# Patient Record
Sex: Female | Born: 1950
Health system: Southern US, Community
[De-identification: ages and names within clinical notes are randomized; demographics above are authoritative.]

## PROBLEM LIST (undated history)

## (undated) DIAGNOSIS — F419 Anxiety disorder, unspecified: Secondary | ICD-10-CM

## (undated) DIAGNOSIS — G959 Disease of spinal cord, unspecified: Secondary | ICD-10-CM

## (undated) DIAGNOSIS — Z973 Presence of spectacles and contact lenses: Secondary | ICD-10-CM

## (undated) DIAGNOSIS — R51 Headache: Secondary | ICD-10-CM

## (undated) DIAGNOSIS — E785 Hyperlipidemia, unspecified: Secondary | ICD-10-CM

## (undated) DIAGNOSIS — H269 Unspecified cataract: Secondary | ICD-10-CM

## (undated) DIAGNOSIS — I10 Essential (primary) hypertension: Secondary | ICD-10-CM

## (undated) DIAGNOSIS — Z8719 Personal history of other diseases of the digestive system: Secondary | ICD-10-CM

## (undated) DIAGNOSIS — J449 Chronic obstructive pulmonary disease, unspecified: Secondary | ICD-10-CM

## (undated) DIAGNOSIS — N200 Calculus of kidney: Secondary | ICD-10-CM

## (undated) DIAGNOSIS — M1990 Unspecified osteoarthritis, unspecified site: Secondary | ICD-10-CM

## (undated) DIAGNOSIS — F32A Depression, unspecified: Secondary | ICD-10-CM

## (undated) DIAGNOSIS — H81399 Other peripheral vertigo, unspecified ear: Secondary | ICD-10-CM

## (undated) DIAGNOSIS — F1910 Other psychoactive substance abuse, uncomplicated: Secondary | ICD-10-CM

## (undated) DIAGNOSIS — R519 Headache, unspecified: Secondary | ICD-10-CM

## (undated) DIAGNOSIS — F329 Major depressive disorder, single episode, unspecified: Secondary | ICD-10-CM

## (undated) DIAGNOSIS — G709 Myoneural disorder, unspecified: Secondary | ICD-10-CM

## (undated) HISTORY — DX: Anxiety disorder, unspecified: F41.9

## (undated) HISTORY — DX: Hyperlipidemia, unspecified: E78.5

## (undated) HISTORY — DX: Other psychoactive substance abuse, uncomplicated: F19.10

## (undated) HISTORY — DX: Myoneural disorder, unspecified: G70.9

## (undated) HISTORY — DX: Unspecified osteoarthritis, unspecified site: M19.90

## (undated) HISTORY — PX: APPENDECTOMY: SHX54

## (undated) HISTORY — PX: ABDOMINAL HYSTERECTOMY: SHX81

## (undated) HISTORY — DX: Depression, unspecified: F32.A

## (undated) HISTORY — DX: Major depressive disorder, single episode, unspecified: F32.9

---

## 2011-09-13 ENCOUNTER — Emergency Department (HOSPITAL_COMMUNITY): Payer: Self-pay

## 2011-09-13 ENCOUNTER — Other Ambulatory Visit: Payer: Self-pay

## 2011-09-13 ENCOUNTER — Encounter (HOSPITAL_COMMUNITY): Payer: Self-pay | Admitting: Emergency Medicine

## 2011-09-13 ENCOUNTER — Emergency Department (HOSPITAL_COMMUNITY)
Admission: EM | Admit: 2011-09-13 | Discharge: 2011-09-13 | Disposition: A | Discharge status: Home / Self Care | Payer: Self-pay | Attending: Emergency Medicine | Admitting: Emergency Medicine

## 2011-09-13 DIAGNOSIS — H81399 Other peripheral vertigo, unspecified ear: Secondary | ICD-10-CM | POA: Insufficient documentation

## 2011-09-13 HISTORY — DX: Essential (primary) hypertension: I10

## 2011-09-13 HISTORY — DX: Chronic obstructive pulmonary disease, unspecified: J44.9

## 2011-09-13 LAB — BASIC METABOLIC PANEL
BUN: 13 mg/dL (ref 6–23)
CO2: 24 mEq/L (ref 19–32)
Calcium: 10.4 mg/dL (ref 8.4–10.5)
Chloride: 100 mEq/L (ref 96–112)
Creatinine, Ser: 0.63 mg/dL (ref 0.50–1.10)
GFR calc Af Amer: >90 mL/min (ref >90)
GFR calc non Af Amer: >90 mL/min (ref >90)
Glucose, Bld: 180 mg/dL — ABNORMAL HIGH (ref 70–99)
Potassium: 4.1 mEq/L (ref 3.5–5.1)
Sodium: 138 mEq/L (ref 135–145)

## 2011-09-13 LAB — CBC
HCT: 42.9 % (ref 36.0–46.0)
Hemoglobin: 14.3 g/dL (ref 12.0–15.0)
MCH: 29.4 pg (ref 26.0–34.0)
MCHC: 33.3 g/dL (ref 30.0–36.0)
MCV: 88.3 fL (ref 78.0–100.0)
Platelets: 348 10*3/uL (ref 150–400)
RBC: 4.86 MIL/uL (ref 3.87–5.11)
RDW: 12.5 % (ref 11.5–15.5)
WBC: 6.5 10*3/uL (ref 4.0–10.5)

## 2011-09-13 LAB — DIFFERENTIAL
Basophils Absolute: 0 10*3/uL (ref 0.0–0.1)
Basophils Relative: 1 % (ref 0–1)
Eosinophils Absolute: 0.2 10*3/uL (ref 0.0–0.7)
Eosinophils Relative: 4 % (ref 0–5)
Lymphocytes Relative: 34 % (ref 12–46)
Lymphs Abs: 2.2 10*3/uL (ref 0.7–4.0)
Monocytes Absolute: 0.4 10*3/uL (ref 0.1–1.0)
Monocytes Relative: 7 % (ref 3–12)
Neutro Abs: 3.5 10*3/uL (ref 1.7–7.7)
Neutrophils Relative %: 55 % (ref 43–77)

## 2011-09-13 LAB — URINALYSIS, ROUTINE W REFLEX MICROSCOPIC
Bilirubin Urine: NEGATIVE
Glucose, UA: NEGATIVE mg/dL
Hgb urine dipstick: NEGATIVE
Ketones, ur: NEGATIVE mg/dL
Nitrite: NEGATIVE
Protein, ur: NEGATIVE mg/dL
Specific Gravity, Urine: >1.03 — ABNORMAL HIGH (ref 1.005–1.030)
Urobilinogen, UA: 0.2 mg/dL (ref 0.0–1.0)
pH: 5.5 (ref 5.0–8.0)

## 2011-09-13 LAB — TROPONIN I: Troponin I: <0.3 ng/mL (ref <0.30)

## 2011-09-13 LAB — URINE MICROSCOPIC-ADD ON

## 2011-09-13 MED ORDER — MECLIZINE HCL 25 MG PO TABS: 25.0000 mg | ORAL_TABLET | Freq: Three times a day (TID) | ORAL | Status: AC | PRN

## 2011-09-13 MED ORDER — MECLIZINE HCL 12.5 MG PO TABS
50.0000 mg | ORAL_TABLET | Freq: Once | ORAL | Status: AC
  Administered 2011-09-13: 50 mg via ORAL
  Filled 2011-09-13: qty 4

## 2011-09-13 NOTE — ED Notes (Signed)
Pt states she woke up with severe dizziness.

## 2011-09-13 NOTE — Discharge Instructions (Signed)
RESOURCE GUIDE  Dental Problems  Patients with Medicaid: [facility]                     [facility] [address]                                           [address] Phone:  [phone]                                                  Phone:  [phone]  If unable to pay or uninsured, contact:  [facility] or [facility] to become qualified for the adult dental clinic.  Chronic Pain Problems Contact [facility]  [phone] Patients need to be referred by their primary care doctor.  Insufficient Money for Medicine Contact [employer]:  call "211" or [facility] 271-5999.  No Primary Care Doctor Call [facility]  [phone] Other agencies that provide inexpensive medical care    [facility]  [phone]    [facility]  [phone]    [facility]  [phone]    [facility] Clinic  [phone]    [facility]  [phone]    [facility]  [phone]  Psychological Services [facility]  [phone] [facility]  [phone] [facility]   [phone] (emergency services [phone])  Substance Abuse Resources [facility]  336-882-2125 [facility] 336-784-9470 [facility] 336-285-9073 [facility] 336-845-3988 Residential & Outpatient Substance Abuse Program  800-659-3381  Abuse/Neglect [place] Child Abuse Hotline (336) 641-3795 [place] Child Abuse Hotline 800-378-5315 (After Hours)  Emergency Shelter [facility] (336) 271-5985  Maternity Homes [facility] (336) 275-9566 [facility] (704) 372-4663  MRSA Hotline #:   832-7006    [place] [facility]  [facility] [place]     [employer]                          [facility] [address] [place]                       [address]      [address]  [place]                                                [place]                            [place] Phone:  [phone]                                   Phone:  [phone]                 Phone:  [phone]  [facility] Phone:  [phone]    [facility] Child Abuse Hotline 361 491 3734 8627061627 (After Hours)    Take the prescription as directed.  Call your regular medical doctor and the ENT doctor today to schedule a follow up appointment this week.  Return to the Emergency Department immediately sooner if worsening.

## 2011-09-13 NOTE — ED Provider Notes (Signed)
History     CSN: [id number]  Arrival date & time 09/13/11  1120   First MD Initiated Contact with Patient 09/13/11 1358      Chief Complaint  Patient presents with  . Dizziness    HPI Pt was seen at 1410.  Per pt, c/o sudden onset and persistence of multiple intermittent episodes of "dizziness" that began this morning after waking up.  Describes the dizziness as "everything is moving around," worsens with side-to-side movement of her head and position changes.  Has been assoc with feeling "off balance" while walking.  States she had URI symptoms in the past 1 to 2 months that have since resolved.  Endorses hx of "inner ear problem" several years ago but cannot recall what her dx was.  Denies CP/palpitations, no SOB/cough, no N/V/D, no abd pain, no headache, no visual changes, no focal motor weakness, no tingling/numbness in extremities, no head/neck injury.    Past Medical History  Diagnosis Date  . Hypertension   . Diabetes mellitus   . Asthma   . COPD (chronic obstructive pulmonary disease)     Past Surgical History  Procedure Date  . Abdominal hysterectomy     History  Substance Use Topics  . Smoking status: Not on file  . Smokeless tobacco: Not on file  . Alcohol Use: Yes    Review of Systems ROS: Statement: All systems negative except as marked or noted in the HPI; Constitutional: Negative for fever and chills. ; ; Eyes: Negative for eye pain, redness and discharge. ; ; ENMT: Negative for ear pain, hoarseness, nasal congestion, sinus pressure and sore throat. ; ; Cardiovascular: Negative for chest pain, palpitations, diaphoresis, dyspnea and peripheral edema. ; ; Respiratory: Negative for cough, wheezing and stridor. ; ; Gastrointestinal: Negative for nausea, vomiting, diarrhea, abdominal pain, blood in stool, hematemesis, jaundice and rectal bleeding. . ; ; Genitourinary: Negative for dysuria, flank pain and hematuria. ; ; Musculoskeletal: Negative for back pain and neck  pain. Negative for swelling and trauma.; ; Skin: Negative for pruritus, rash, abrasions, blisters, bruising and skin lesion.; ; Neuro: +dizziness/vertigo.  Negative for headache, lightheadedness and neck stiffness. Negative for weakness, altered level of consciousness , altered mental status, extremity weakness, paresthesias, involuntary movement, seizure and syncope.      Allergies  Prozac  Home Medications  No current outpatient prescriptions on file.  BP 138/86  Pulse 97  Temp(Src) 98.2 F (36.8 C) (Oral)  Resp 20  Ht 5\' 1"  (1.549 m)  Wt 157 lb (71.215 kg)  BMI 29.67 kg/m2  SpO2 95%  Physical Exam 1415: Physical examination:  Nursing notes reviewed; Vital signs and O2 SAT reviewed;  Constitutional: Well developed, Well nourished, Well hydrated, In no acute distress; Head:  Normocephalic, atraumatic; Eyes: EOMI, PERRL, No scleral icterus; ENMT: TM's clear bilat. Mouth and pharynx normal, Mucous membranes moist; Neck: Supple, Full range of motion, No lymphadenopathy; Cardiovascular: Regular rate and rhythm, No murmur, rub, or gallop; Respiratory: Breath sounds clear & equal bilaterally, No rales, rhonchi, wheezes, or rub, Normal respiratory effort/excursion; Chest: Nontender, Movement normal; Abdomen: Soft, Nontender, Nondistended, Normal bowel sounds; Extremities: Pulses normal, No tenderness, No edema, No calf edema or asymmetry.; Neuro: AA&Ox3, Major CN grossly intact.  Strength 5/5 equal bilat UE's and LE's.  DTR 2/4 equal bilat UE's and LE's.  No gross sensory deficits.  Normal cerebellar testing bilat UE's and LE's.  No pronator drift.  Speech clear.  No facial droop.  +left horizontal gaze fatigable nystagmus;; Skin:  Color normal, Warm, Dry, no rash.   ED Course  Procedures    MDM  MDM Reviewed: nursing note and vitals Reviewed previous: ECG Interpretation: ECG, labs, x-ray and CT scan      Date: 09/13/2011  Rate: 94  Rhythm: normal sinus rhythm  QRS Axis: normal   Intervals: normal  ST/T Wave abnormalities: normal  Conduction Disutrbances:none  Narrative Interpretation:   Old EKG Reviewed: none available.   Results for orders placed during the hospital encounter of 09/13/11  CBC      Component Value Range   WBC 6.5  4.0 - 10.5 (K/uL)   RBC 4.86  3.87 - 5.11 (MIL/uL)   Hemoglobin 14.3  12.0 - 15.0 (g/dL)   HCT 16.1  09.6 - 04.5 (%)   MCV 88.3  78.0 - 100.0 (fL)   MCH 29.4  26.0 - 34.0 (pg)   MCHC 33.3  30.0 - 36.0 (g/dL)   RDW 40.9  81.1 - 91.4 (%)   Platelets 348  150 - 400 (K/uL)  DIFFERENTIAL      Component Value Range   Neutrophils Relative 55  43 - 77 (%)   Neutro Abs 3.5  1.7 - 7.7 (K/uL)   Lymphocytes Relative 34  12 - 46 (%)   Lymphs Abs 2.2  0.7 - 4.0 (K/uL)   Monocytes Relative 7  3 - 12 (%)   Monocytes Absolute 0.4  0.1 - 1.0 (K/uL)   Eosinophils Relative 4  0 - 5 (%)   Eosinophils Absolute 0.2  0.0 - 0.7 (K/uL)   Basophils Relative 1  0 - 1 (%)   Basophils Absolute 0.0  0.0 - 0.1 (K/uL)  BASIC METABOLIC PANEL      Component Value Range   Sodium 138  135 - 145 (mEq/L)   Potassium 4.1  3.5 - 5.1 (mEq/L)   Chloride 100  96 - 112 (mEq/L)   CO2 24  19 - 32 (mEq/L)   Glucose, Bld 180 (*) 70 - 99 (mg/dL)   BUN 13  6 - 23 (mg/dL)   Creatinine, Ser 7.82  0.50 - 1.10 (mg/dL)   Calcium 95.6  8.4 - 10.5 (mg/dL)   GFR calc non Af Amer >90  >90 (mL/min)   GFR calc Af Amer >90  >90 (mL/min)  TROPONIN I      Component Value Range   Troponin I <0.30  <0.30 (ng/mL)  URINALYSIS, ROUTINE W REFLEX MICROSCOPIC      Component Value Range   Color, Urine YELLOW  YELLOW    APPearance HAZY (*) CLEAR    Specific Gravity, Urine >1.030 (*) 1.005 - 1.030    pH 5.5  5.0 - 8.0    Glucose, UA NEGATIVE  NEGATIVE (mg/dL)   Hgb urine dipstick NEGATIVE  NEGATIVE    Bilirubin Urine NEGATIVE  NEGATIVE    Ketones, ur NEGATIVE  NEGATIVE (mg/dL)   Protein, ur NEGATIVE  NEGATIVE (mg/dL)   Urobilinogen, UA 0.2  0.0 - 1.0 (mg/dL)   Nitrite NEGATIVE   NEGATIVE    Leukocytes, UA SMALL (*) NEGATIVE   URINE MICROSCOPIC-ADD ON      Component Value Range   Squamous Epithelial / LPF MANY (*) RARE    WBC, UA 3-6  <3 (WBC/hpf)   Bacteria, UA MANY (*) RARE     Dg Chest 2 View 09/13/2011  *RADIOLOGY REPORT*  Clinical Data: Dizziness  CHEST - 2 VIEW  Comparison: 03/11/2011  Findings: Lungs are clear. No pleural effusion or pneumothorax.  Cardiomediastinal silhouette is within normal limits.  Mild degenerative changes of the visualized thoracolumbar spine.  IMPRESSION: No evidence of acute cardiopulmonary disease.  Original Report Authenticated By: Charline Bills, M.D.   Ct Head Wo Contrast 09/13/2011  *RADIOLOGY REPORT*  Clinical Data: Dizziness.  CT HEAD WITHOUT CONTRAST  Technique:  Contiguous axial images were obtained from the base of the skull through the vertex without contrast.  Comparison: None.  Findings: No evidence of acute infarct, acute hemorrhage, mass lesion, mass effect or hydrocephalus.  There may be a small air- fluid level in a somewhat hypoplastic left sphenoid sinus.  Mastoid air cells are clear.  IMPRESSION:  1.  No acute intracranial abnormality. 2.  Tiny air fluid level in the left sphenoid sinus.  Original Report Authenticated By: Reyes Ivan, M.D.     4:47 PM:  Pt states she feels "better now" after meds and wants to go home.  VSS, resps easy, neuro exam unchanged and intact.  Pt has ambulated in the hallways without difficulty, gait steady.  Dx testing d/w pt.  Questions answered.  Verb understanding, agreeable to d/c home with outpt f/u.         Laray Anger, DO 09/16/11 1406

## 2011-09-13 NOTE — ED Notes (Signed)
Ambulated in hallway with no difficulty  

## 2011-09-14 LAB — URINE CULTURE
Colony Count: NO GROWTH
Culture  Setup Time: 201303050137
Culture: NO GROWTH

## 2012-02-07 ENCOUNTER — Other Ambulatory Visit (HOSPITAL_BASED_OUTPATIENT_CLINIC_OR_DEPARTMENT_OTHER): Payer: Self-pay | Admitting: General Surgery

## 2012-02-07 ENCOUNTER — Ambulatory Visit (HOSPITAL_COMMUNITY)
Admission: RE | Admit: 2012-02-07 | Discharge: 2012-02-07 | Disposition: A | Discharge status: Home / Self Care | Payer: Self-pay | Source: Ambulatory Visit | Attending: Nurse Practitioner | Admitting: Nurse Practitioner

## 2012-02-07 ENCOUNTER — Other Ambulatory Visit (HOSPITAL_COMMUNITY): Payer: Self-pay | Admitting: Nurse Practitioner

## 2012-02-07 DIAGNOSIS — R059 Cough, unspecified: Secondary | ICD-10-CM | POA: Insufficient documentation

## 2012-02-07 DIAGNOSIS — R05 Cough: Secondary | ICD-10-CM

## 2012-04-24 ENCOUNTER — Emergency Department (HOSPITAL_COMMUNITY)
Admission: EM | Admit: 2012-04-24 | Discharge: 2012-04-24 | Disposition: A | Discharge status: Home / Self Care | Payer: Self-pay | Attending: Emergency Medicine | Admitting: Emergency Medicine

## 2012-04-24 ENCOUNTER — Emergency Department (HOSPITAL_COMMUNITY): Payer: Self-pay

## 2012-04-24 ENCOUNTER — Encounter (HOSPITAL_COMMUNITY): Payer: Self-pay | Admitting: *Deleted

## 2012-04-24 DIAGNOSIS — I10 Essential (primary) hypertension: Secondary | ICD-10-CM | POA: Insufficient documentation

## 2012-04-24 DIAGNOSIS — E119 Type 2 diabetes mellitus without complications: Secondary | ICD-10-CM | POA: Insufficient documentation

## 2012-04-24 DIAGNOSIS — J449 Chronic obstructive pulmonary disease, unspecified: Secondary | ICD-10-CM | POA: Insufficient documentation

## 2012-04-24 DIAGNOSIS — J4489 Other specified chronic obstructive pulmonary disease: Secondary | ICD-10-CM | POA: Insufficient documentation

## 2012-04-24 DIAGNOSIS — J40 Bronchitis, not specified as acute or chronic: Secondary | ICD-10-CM | POA: Insufficient documentation

## 2012-04-24 MED ORDER — PREDNISONE 20 MG PO TABS
60.0000 mg | ORAL_TABLET | Freq: Once | ORAL | Status: AC
  Administered 2012-04-24: 60 mg via ORAL
  Filled 2012-04-24: qty 3

## 2012-04-24 MED ORDER — PREDNISONE 10 MG PO TABS: 20.0000 mg | ORAL_TABLET | Freq: Every day | ORAL | Status: DC

## 2012-04-24 MED ORDER — IPRATROPIUM BROMIDE 0.02 % IN SOLN
0.5000 mg | Freq: Once | RESPIRATORY_TRACT | Status: AC
  Administered 2012-04-24: 0.5 mg via RESPIRATORY_TRACT
  Filled 2012-04-24: qty 2.5

## 2012-04-24 MED ORDER — ALBUTEROL SULFATE (5 MG/ML) 0.5% IN NEBU
5.0000 mg | INHALATION_SOLUTION | Freq: Once | RESPIRATORY_TRACT | Status: AC
  Administered 2012-04-24: 5 mg via RESPIRATORY_TRACT
  Filled 2012-04-24: qty 1

## 2012-04-24 MED ORDER — AMOXICILLIN 500 MG PO CAPS: 500.0000 mg | ORAL_CAPSULE | Freq: Three times a day (TID) | ORAL | Status: DC

## 2012-04-24 NOTE — ED Notes (Signed)
C/o cough, congestion that started a few weeks ago, cough is productive with brown sputum production, [facility], given Claritin and z-pack, states that she became better but cough has returned again, pt denies any fever, pt does state that she is sob but always stays sob, sob is not any worse than her "normal"

## 2012-04-24 NOTE — ED Notes (Signed)
Patient with no complaints at this time. Respirations even and unlabored. Skin warm/dry. Discharge instructions reviewed with patient at this time. Patient given opportunity to voice concerns/ask questions. Patient discharged at this time and left Emergency Department with steady gait.   

## 2012-04-24 NOTE — ED Provider Notes (Signed)
History   This chart was scribed for Lindsey Lennert, MD by Sofie Rower. The patient was seen in room APA18/APA18 and the patient's care was started at 3:17PM.     CSN: [id number]  Arrival date & time 04/24/12  1406   First MD Initiated Contact with Patient 04/24/12 1517      Chief Complaint  Patient presents with  . Cough    (Consider location/radiation/quality/duration/timing/severity/associated sxs/prior treatment) Patient is a 61 y.o. female presenting with cough. The history is provided by the patient. No language interpreter was used.  Cough This is a new problem. The current episode started more than 1 week ago. The problem occurs every few minutes. The problem has been gradually worsening. The cough is productive of brown sputum. There has been no fever. Pertinent negatives include no chest pain, no sweats and no headaches. She has tried nothing for the symptoms. The treatment provided no relief. Her past medical history is significant for asthma.    Past Medical History  Diagnosis Date  . Hypertension   . Diabetes mellitus   . Asthma   . COPD (chronic obstructive pulmonary disease)     Past Surgical History  Procedure Date  . Abdominal hysterectomy     No family history on file.  History  Substance Use Topics  . Smoking status: Not on file  . Smokeless tobacco: Not on file  . Alcohol Use: Yes    OB History    Grav Para Term Preterm Abortions TAB SAB Ect Mult Living                  Review of Systems  Respiratory: Positive for cough.   Cardiovascular: Negative for chest pain.  Neurological: Negative for headaches.  All other systems reviewed and are negative.    Allergies  Prozac  Home Medications   Current Outpatient Rx  Name Route Sig Dispense Refill  . ALBUTEROL SULFATE HFA 108 (90 BASE) MCG/ACT IN AERS Inhalation Inhale 2 puffs into the lungs every 6 (six) hours as needed. Shortness of breath/wheezing    . FLUTICASONE-SALMETEROL 250-50  MCG/DOSE IN AEPB Inhalation Inhale 1 puff into the lungs every 12 (twelve) hours.    Marland Kitchen METFORMIN HCL 500 MG PO TABS Oral Take 500-1,000 mg by mouth 2 (two) times daily. Patient takes 2 tablets in the morning and 1 tablet in the evening    . VENLAFAXINE HCL ER 150 MG PO CP24 Oral Take 300 mg by mouth daily.      BP 140/89  Pulse 103  Temp 98.8 F (37.1 C) (Oral)  Resp 20  Ht 5\' 1"  (1.549 m)  Wt 157 lb (71.215 kg)  BMI 29.67 kg/m2  SpO2 98%  Physical Exam  Nursing note and vitals reviewed. Constitutional: She is oriented to person, place, and time. She appears well-developed.  HENT:  Head: Normocephalic and atraumatic.  Eyes: Conjunctivae normal and EOM are normal. No scleral icterus.  Neck: Neck supple. No thyromegaly present.  Cardiovascular: Normal rate and regular rhythm.  Exam reveals no gallop and no friction rub.   No murmur heard. Pulmonary/Chest: No stridor. She has wheezes (mild wheezing detected throughout all fields. ). She has no rales. She exhibits no tenderness.  Abdominal: She exhibits no distension. There is no tenderness. There is no rebound.  Musculoskeletal: Normal range of motion. She exhibits no edema.  Lymphadenopathy:    She has no cervical adenopathy.  Neurological: She is oriented to person, place, and time. Coordination normal.  Skin: No rash noted. No erythema.  Psychiatric: She has a normal mood and affect. Her behavior is normal.    ED Course  Procedures (including critical care time)  DIAGNOSTIC STUDIES: Oxygen Saturation is 98% on room air, normal by my interpretation.    COORDINATION OF CARE:    3:19PM- Treatment plan concerning evaluation of x-ray and breathing treatment discussed with patient. Pt agrees with treatment.   Labs Reviewed - No data to display Dg Chest 2 View  04/24/2012  *RADIOLOGY REPORT*  Clinical Data: Congestion and cough  CHEST - 2 VIEW  Comparison: April 09, 2012  Findings: There is no focal infiltrate, pulmonary  edema, or pleural effusion.  The mediastinal contour and cardiac silhouette are normal.  The aorta is tortuous.  The soft tissue and osseous structures are stable.  IMPRESSION: No acute cardiopulmonary disease identified.   Original Report Authenticated By: Sherian Rein, M.D.      No diagnosis found.  Pt improved with tx  MDM        The chart was scribed for me under my direct supervision.  I personally performed the history, physical, and medical decision making and all procedures in the evaluation of this patient.Lindsey Lennert, MD 04/24/12 [phone]

## 2012-09-24 ENCOUNTER — Encounter (HOSPITAL_COMMUNITY): Payer: Self-pay

## 2012-09-24 ENCOUNTER — Emergency Department (HOSPITAL_COMMUNITY)
Admission: EM | Admit: 2012-09-24 | Discharge: 2012-09-24 | Disposition: A | Discharge status: Home / Self Care | Payer: Self-pay | Attending: Emergency Medicine | Admitting: Emergency Medicine

## 2012-09-24 DIAGNOSIS — M546 Pain in thoracic spine: Secondary | ICD-10-CM | POA: Insufficient documentation

## 2012-09-24 DIAGNOSIS — Z7982 Long term (current) use of aspirin: Secondary | ICD-10-CM | POA: Insufficient documentation

## 2012-09-24 DIAGNOSIS — M5380 Other specified dorsopathies, site unspecified: Secondary | ICD-10-CM | POA: Insufficient documentation

## 2012-09-24 DIAGNOSIS — J449 Chronic obstructive pulmonary disease, unspecified: Secondary | ICD-10-CM | POA: Insufficient documentation

## 2012-09-24 DIAGNOSIS — M62830 Muscle spasm of back: Secondary | ICD-10-CM

## 2012-09-24 DIAGNOSIS — E119 Type 2 diabetes mellitus without complications: Secondary | ICD-10-CM | POA: Insufficient documentation

## 2012-09-24 DIAGNOSIS — I10 Essential (primary) hypertension: Secondary | ICD-10-CM | POA: Insufficient documentation

## 2012-09-24 DIAGNOSIS — F411 Generalized anxiety disorder: Secondary | ICD-10-CM | POA: Insufficient documentation

## 2012-09-24 DIAGNOSIS — J4489 Other specified chronic obstructive pulmonary disease: Secondary | ICD-10-CM | POA: Insufficient documentation

## 2012-09-24 DIAGNOSIS — Z79899 Other long term (current) drug therapy: Secondary | ICD-10-CM | POA: Insufficient documentation

## 2012-09-24 DIAGNOSIS — G8929 Other chronic pain: Secondary | ICD-10-CM | POA: Insufficient documentation

## 2012-09-24 MED ORDER — DIAZEPAM 5 MG PO TABS: 5.0000 mg | ORAL_TABLET | Freq: Three times a day (TID) | ORAL | Status: DC | PRN

## 2012-09-24 MED ORDER — DIAZEPAM 5 MG PO TABS
5.0000 mg | ORAL_TABLET | Freq: Once | ORAL | Status: AC
  Administered 2012-09-24: 5 mg via ORAL
  Filled 2012-09-24: qty 1

## 2012-09-24 NOTE — ED Notes (Signed)
Pt presents to ED with c/o pain to her right shoulder. Pt said she has had this pain for a year but within the last week or so the pain has gone down her right arm. Pt has had no injury prior to pain.

## 2012-09-27 NOTE — ED Provider Notes (Signed)
History     CSN: [id number]  Arrival date & time 09/24/12  1110   First MD Initiated Contact with Patient 09/24/12 1227      Chief Complaint  Patient presents with  . Shoulder Pain    (Consider location/radiation/quality/duration/timing/severity/associated sxs/prior treatment) HPI Comments: Lindsey Melton is a 62 y.o. Female who has had chronic intermittent pain in her right shoulder and upper back for the past year.  She denies any specific injury as the source of her symptoms.  She reports a history of anxiety and has been told in the past that she carries her stress in her neck and shoulders.  She has noticed having intermittent muscle spasms in her right upper back for the past week and when it is severe, the pain radiates into her right arm.  She denies weakness or numbness in her extremities, also denies chest pain and shortness of breath.  She denies a history of neck injuries.  Movement of her head and shoulder,  Right arm worsens the pain,  But has pain without movement today as well.  She has taken no medications for her symptoms and has not discussed this problem with her pcp,  But is scheduled to be seen on March 26.     The history is provided by the patient.    Past Medical History  Diagnosis Date  . Hypertension   . Diabetes mellitus   . Asthma   . COPD (chronic obstructive pulmonary disease)     Past Surgical History  Procedure Laterality Date  . Abdominal hysterectomy      No family history on file.  History  Substance Use Topics  . Smoking status: Never Smoker   . Smokeless tobacco: Not on file  . Alcohol Use: Yes     Comment: Occasional     OB History   Grav Para Term Preterm Abortions TAB SAB Ect Mult Living                  Review of Systems  Constitutional: Negative for fever and chills.  HENT: Negative for congestion, sore throat and neck pain.   Eyes: Negative.   Respiratory: Negative for chest tightness and shortness of breath.    Cardiovascular: Negative for chest pain.  Gastrointestinal: Negative for nausea and abdominal pain.  Genitourinary: Negative.   Musculoskeletal: Positive for myalgias. Negative for joint swelling and arthralgias.  Skin: Negative.  Negative for rash and wound.  Neurological: Negative for dizziness, weakness, light-headedness, numbness and headaches.  Psychiatric/Behavioral: The patient is nervous/anxious.     Allergies  Prozac  Home Medications   Current Outpatient Rx  Name  Route  Sig  Dispense  Refill  . albuterol (PROVENTIL HFA;VENTOLIN HFA) 108 (90 BASE) MCG/ACT inhaler   Inhalation   Inhale 2 puffs into the lungs every 6 (six) hours as needed. Shortness of breath/wheezing         . amoxicillin (AMOXIL) 500 MG capsule   Oral   Take 1 capsule (500 mg total) by mouth 3 (three) times daily.   21 capsule   0   . aspirin EC 81 MG tablet   Oral   Take 81 mg by mouth daily.         . busPIRone (BUSPAR) 10 MG tablet   Oral   Take 10 mg by mouth 3 (three) times daily.         . diazepam (VALIUM) 5 MG tablet   Oral   Take 1 tablet (5 mg  total) by mouth every 8 (eight) hours as needed (muscle spasm).   15 tablet   0   . Fluticasone-Salmeterol (ADVAIR) 250-50 MCG/DOSE AEPB   Inhalation   Inhale 1 puff into the lungs every 12 (twelve) hours.         . metFORMIN (GLUCOPHAGE) 1000 MG tablet   Oral   Take 1,000 mg by mouth 2 (two) times daily.         . predniSONE (DELTASONE) 10 MG tablet   Oral   Take 2 tablets (20 mg total) by mouth daily.   15 tablet   0   . quinapril-hydrochlorothiazide (ACCURETIC) 20-12.5 MG per tablet   Oral   Take 1 tablet by mouth daily.         . quinapril-hydrochlorothiazide (ACCURETIC) 20-12.5 MG per tablet   Oral   Take 1 tablet by mouth daily.         Marland Kitchen venlafaxine (EFFEXOR-XR) 150 MG 24 hr capsule   Oral   Take 300 mg by mouth daily.           BP 132/93  Pulse 94  Temp(Src) 97.7 F (36.5 C)  Resp 18  Ht 5\' 1"   (1.549 m)  Wt 157 lb (71.215 kg)  BMI 29.68 kg/m2  SpO2 99%  Physical Exam  Constitutional: She appears well-developed and well-nourished.  HENT:  Head: Atraumatic.  Neck: Normal range of motion.  Cardiovascular:  Pulses:      Radial pulses are 2+ on the right side, and 2+ on the left side.  Pulses equal bilaterally  Musculoskeletal: She exhibits tenderness.       Right shoulder: She exhibits normal range of motion, no bony tenderness, no crepitus and no deformity.       Cervical back: She exhibits spasm.  Muscle spasm and ttp along right upper trapezius musculature.    Neurological: She is alert. She has normal strength. She displays normal reflexes. No sensory deficit.  Reflex Scores:      Bicep reflexes are 2+ on the right side and 2+ on the left side.      Brachioradialis reflexes are 2+ on the right side and 2+ on the left side. Equal grip strength  Skin: Skin is warm and dry.  Psychiatric: Her mood appears anxious.    ED Course  Procedures (including critical care time)  Labs Reviewed - No data to display No results found.   1. Muscle spasm of back       MDM  Pt prescribed valium for muscle spasm.  Pt also appears very anxious with history of anxiety disorder,  So valium may give added benefit. Heat therapy/ massage also recommended.  F/u with pcp on the 26th as planned. She is also scheduled to see her provider at [facility] on 3/30.  Encouraged to keep this appt.  The patient appears reasonably screened and/or stabilized for discharge and I doubt any other medical condition or other [facility] requiring further screening, evaluation, or treatment in the ED at this time prior to discharge.          Burgess Amor, PA-C 09/27/12 1314

## 2012-09-27 NOTE — ED Provider Notes (Signed)
Medical screening examination/treatment/procedure(s) were performed by non-physician practitioner and as supervising physician I was immediately available for consultation/collaboration.  Khadijatou Borak, MD 09/27/12 1450 

## 2012-10-24 ENCOUNTER — Encounter (HOSPITAL_COMMUNITY): Payer: Self-pay | Admitting: Dietician

## 2012-10-24 NOTE — Progress Notes (Signed)
[facility] Diabetes Class Completion  Date:October 24, 2012  Time: 1000  Pt attended  Hospital[facility] Diabetes [facility] Education Class on October 24, 2012.   Patient was educated on the following topics: survival skills (signs and symptoms of hyperglycemia and hypoglycemia, treatment for hypoglycemia, ideal levels for fasting and postprandial blood sugars, goal Hgb A1c level, foot care basics), recommendations for physical activity, carbohydrate metabolism in relation to diabetes, and meal planning (sources of carbohydrate, carbohydrate counting, meal planning strategies, food label reading, and portion control).   Lindsey Melton A. Kayan, RD, LDN  

## 2012-11-01 ENCOUNTER — Other Ambulatory Visit (HOSPITAL_COMMUNITY): Payer: Self-pay | Admitting: Physician Assistant

## 2012-11-01 DIAGNOSIS — Z139 Encounter for screening, unspecified: Secondary | ICD-10-CM

## 2012-11-07 ENCOUNTER — Ambulatory Visit (HOSPITAL_COMMUNITY)
Admission: RE | Admit: 2012-11-07 | Discharge: 2012-11-07 | Disposition: A | Discharge status: Home / Self Care | Payer: Self-pay | Source: Ambulatory Visit | Attending: Physician Assistant | Admitting: Physician Assistant

## 2012-11-07 DIAGNOSIS — Z139 Encounter for screening, unspecified: Secondary | ICD-10-CM

## 2013-09-30 ENCOUNTER — Encounter (HOSPITAL_COMMUNITY): Payer: Self-pay | Admitting: Emergency Medicine

## 2013-09-30 ENCOUNTER — Other Ambulatory Visit: Payer: Self-pay

## 2013-09-30 ENCOUNTER — Emergency Department (HOSPITAL_COMMUNITY)
Admission: EM | Admit: 2013-09-30 | Discharge: 2013-09-30 | Disposition: A | Discharge status: Home / Self Care | Payer: Self-pay | Attending: Emergency Medicine | Admitting: Emergency Medicine

## 2013-09-30 DIAGNOSIS — E119 Type 2 diabetes mellitus without complications: Secondary | ICD-10-CM | POA: Insufficient documentation

## 2013-09-30 DIAGNOSIS — Z79899 Other long term (current) drug therapy: Secondary | ICD-10-CM | POA: Insufficient documentation

## 2013-09-30 DIAGNOSIS — Z7982 Long term (current) use of aspirin: Secondary | ICD-10-CM | POA: Insufficient documentation

## 2013-09-30 DIAGNOSIS — Z792 Long term (current) use of antibiotics: Secondary | ICD-10-CM | POA: Insufficient documentation

## 2013-09-30 DIAGNOSIS — J441 Chronic obstructive pulmonary disease with (acute) exacerbation: Secondary | ICD-10-CM | POA: Insufficient documentation

## 2013-09-30 DIAGNOSIS — M25511 Pain in right shoulder: Secondary | ICD-10-CM

## 2013-09-30 DIAGNOSIS — J45901 Unspecified asthma with (acute) exacerbation: Secondary | ICD-10-CM

## 2013-09-30 DIAGNOSIS — M25519 Pain in unspecified shoulder: Secondary | ICD-10-CM | POA: Insufficient documentation

## 2013-09-30 DIAGNOSIS — IMO0002 Reserved for concepts with insufficient information to code with codable children: Secondary | ICD-10-CM | POA: Insufficient documentation

## 2013-09-30 DIAGNOSIS — I10 Essential (primary) hypertension: Secondary | ICD-10-CM | POA: Insufficient documentation

## 2013-09-30 LAB — CBG MONITORING, ED: Glucose-Capillary: 211 mg/dL — ABNORMAL HIGH (ref 70–99)

## 2013-09-30 MED ORDER — IBUPROFEN 800 MG PO TABS: 800.0000 mg | ORAL_TABLET | Freq: Three times a day (TID) | ORAL | Status: DC

## 2013-09-30 MED ORDER — BACLOFEN 10 MG PO TABS: 10.0000 mg | ORAL_TABLET | Freq: Three times a day (TID) | ORAL | Status: AC

## 2013-09-30 MED ORDER — IBUPROFEN 800 MG PO TABS
800.0000 mg | ORAL_TABLET | Freq: Once | ORAL | Status: AC
  Administered 2013-09-30: 800 mg via ORAL
  Filled 2013-09-30: qty 1

## 2013-09-30 MED ORDER — METHOCARBAMOL 500 MG PO TABS
500.0000 mg | ORAL_TABLET | Freq: Once | ORAL | Status: AC
  Administered 2013-09-30: 500 mg via ORAL
  Filled 2013-09-30: qty 1

## 2013-09-30 NOTE — ED Notes (Signed)
Pt reports pain in r side of neck radiating into r shoulder and r arm for "months."  Pt says pain has just steadily gotten worse over the past few days.  Denies injury.

## 2013-09-30 NOTE — Discharge Instructions (Signed)
Your examination is consistent with arthritis in several areas of your right shoulder. Please see one of the orthopedic specialist for additional evaluation and management of this problem. Please use the sling for the next 5-7 days. Please use ibuprofen 3 times daily with food. Please use baclofen 3 times daily to assist with any spasm pain. Baclofen may cause drowsiness, please use with caution. Shoulder Pain The shoulder is the joint that connects your arms to your body. The bones that form the shoulder joint include the upper arm bone (humerus), the shoulder blade (scapula), and the collarbone (clavicle). The top of the humerus is shaped like a ball and fits into a rather flat socket on the scapula (glenoid cavity). A combination of muscles and strong, fibrous tissues that connect muscles to bones (tendons) support your shoulder joint and hold the ball in the socket. Small, fluid-filled sacs (bursae) are located in different areas of the joint. They act as cushions between the bones and the overlying soft tissues and help reduce friction between the gliding tendons and the bone as you move your arm. Your shoulder joint allows a wide range of motion in your arm. This range of motion allows you to do things like scratch your back or throw a ball. However, this range of motion also makes your shoulder more prone to pain from overuse and injury. Causes of shoulder pain can originate from both injury and overuse and usually can be grouped in the following four categories:  Redness, swelling, and pain (inflammation) of the tendon (tendinitis) or the bursae (bursitis).  Instability, such as a dislocation of the joint.  Inflammation of the joint (arthritis).  Broken bone (fracture). HOME CARE INSTRUCTIONS   Apply ice to the sore area.  Put ice in a plastic bag.  Place a towel between your skin and the bag.  Leave the ice on for 15-20 minutes, 03-04 times per day for the first 2 days.  Stop using  cold packs if they do not help with the pain.  If you have a shoulder sling or immobilizer, wear it as long as your caregiver instructs. Only remove it to shower or bathe. Move your arm as little as possible, but keep your hand moving to prevent swelling.  Squeeze a soft ball or foam pad as much as possible to help prevent swelling.  Only take over-the-counter or prescription medicines for pain, discomfort, or fever as directed by your caregiver. SEEK MEDICAL CARE IF:   Your shoulder pain increases, or new pain develops in your arm, hand, or fingers.  Your hand or fingers become cold and numb.  Your pain is not relieved with medicines. SEEK IMMEDIATE MEDICAL CARE IF:   Your arm, hand, or fingers are numb or tingling.  Your arm, hand, or fingers are significantly swollen or turn white or blue. MAKE SURE YOU:   Understand these instructions.  Will watch your condition.  Will get help right away if you are not doing well or get worse. Document Released: 04/07/2005 Document Revised: 03/22/2012 Document Reviewed: 06/12/2011 [facility] Patient Information 2014 [place]ExitCare, [place].

## 2013-09-30 NOTE — ED Notes (Signed)
Sling refused by patient

## 2013-09-30 NOTE — ED Provider Notes (Signed)
CSN: [id number]     Arrival date & time 09/30/13  1635 History   First MD Initiated Contact with Patient 09/30/13 1717     Chief Complaint  Patient presents with  . Neck Pain     (Consider location/radiation/quality/duration/timing/severity/associated sxs/prior Treatment) HPI Comments: Patient is a 63 year old female who presents to the emergency department with right neck and shoulder pain. The patient states this problem has been going on for" months". In the last 2 days she states that the pain has gotten progressively worse. She has pain with raising her right arm, as well as any circular motion involving the shoulder and neck area. The patient states that friends and neighbors told her that she may have a" pinched nerve" she then came to the emergency department for additional evaluation and management. [facility] She has not made him aware of this newest development.  Patient is a 63 y.o. female presenting with neck pain. The history is provided by the patient.  Neck Pain Pain location:  R side Associated symptoms: no chest pain and no photophobia     Past Medical History  Diagnosis Date  . Hypertension   . Diabetes mellitus   . Asthma   . COPD (chronic obstructive pulmonary disease)    Past Surgical History  Procedure Laterality Date  . Abdominal hysterectomy     No family history on file. History  Substance Use Topics  . Smoking status: Never Smoker   . Smokeless tobacco: Not on file  . Alcohol Use: Yes     Comment: Occasional    OB History   Grav Para Term Preterm Abortions TAB SAB Ect Mult Living                 Review of Systems  Constitutional: Negative for activity change.       All ROS Neg except as noted in HPI  HENT: Negative for nosebleeds.   Eyes: Negative for photophobia and discharge.  Respiratory: Positive for cough and wheezing. Negative for shortness of breath.   Cardiovascular: Negative for  chest pain and palpitations.  Gastrointestinal: Negative for abdominal pain and blood in stool.  Genitourinary: Negative for dysuria, frequency and hematuria.  Musculoskeletal: Positive for arthralgias and neck pain. Negative for back pain.  Skin: Negative.   Neurological: Negative for dizziness, seizures and speech difficulty.  Psychiatric/Behavioral: Negative for hallucinations and confusion.      Allergies  Prozac  Home Medications   Current Outpatient Rx  Name  Route  Sig  Dispense  Refill  . albuterol (PROVENTIL HFA;VENTOLIN HFA) 108 (90 BASE) MCG/ACT inhaler   Inhalation   Inhale 2 puffs into the lungs every 6 (six) hours as needed. Shortness of breath/wheezing         . amoxicillin (AMOXIL) 500 MG capsule   Oral   Take 1 capsule (500 mg total) by mouth 3 (three) times daily.   21 capsule   0   . aspirin EC 81 MG tablet   Oral   Take 81 mg by mouth daily.         . busPIRone (BUSPAR) 10 MG tablet   Oral   Take 10 mg by mouth 3 (three) times daily.         . diazepam (VALIUM) 5 MG tablet   Oral   Take 1 tablet (5 mg total) by mouth every 8 (eight) hours as needed (muscle spasm).   15 tablet  0   . Fluticasone-Salmeterol (ADVAIR) 250-50 MCG/DOSE AEPB   Inhalation   Inhale 1 puff into the lungs every 12 (twelve) hours.         . metFORMIN (GLUCOPHAGE) 1000 MG tablet   Oral   Take 1,000 mg by mouth 2 (two) times daily.         . predniSONE (DELTASONE) 10 MG tablet   Oral   Take 2 tablets (20 mg total) by mouth daily.   15 tablet   0   . quinapril-hydrochlorothiazide (ACCURETIC) 20-12.5 MG per tablet   Oral   Take 1 tablet by mouth daily.         . quinapril-hydrochlorothiazide (ACCURETIC) 20-12.5 MG per tablet   Oral   Take 1 tablet by mouth daily.         Marland Kitchen venlafaxine (EFFEXOR-XR) 150 MG 24 hr capsule   Oral   Take 300 mg by mouth daily.          BP 141/93  Pulse 102  Temp(Src) 98.7 F (37.1 C) (Oral)  Resp 20  Ht 5\' 1"   (1.549 m)  Wt 158 lb (71.668 kg)  BMI 29.87 kg/m2  SpO2 98% Physical Exam  Nursing note and vitals reviewed. Constitutional: She is oriented to person, place, and time. She appears well-developed and well-nourished.  Non-toxic appearance.  HENT:  Head: Normocephalic.  Right Ear: Tympanic membrane and external ear normal.  Left Ear: Tympanic membrane and external ear normal.  Eyes: EOM and lids are normal. Pupils are equal, round, and reactive to light.  Neck: Normal range of motion. Neck supple. Carotid bruit is not present.  Is no palpable cervical lymphadenopathy of the right or left neck. There is soreness at the base of the neck with range of motion extending into the shoulder. There is no carotid bruit appreciated.  Cardiovascular: Normal rate, regular rhythm, normal heart sounds, intact distal pulses and normal pulses.   Pulmonary/Chest: Breath sounds normal. No respiratory distress.  Abdominal: Soft. Bowel sounds are normal. There is no tenderness. There is no guarding.  Musculoskeletal: Normal range of motion.  There is pain with range of motion of the right shoulder. There is pain at the a.c. joint area to palpation. No hot areas appreciated. There is pain at the anterior right shoulder. There is no swelling there. There is no deformity appreciated of the right shoulder. The radial pulses 2+. The capillary refill of the right and left hands is less than 2 seconds.  Lymphadenopathy:       Head (right side): No submandibular adenopathy present.       Head (left side): No submandibular adenopathy present.    She has no cervical adenopathy.  Neurological: She is alert and oriented to person, place, and time. She has normal strength. No cranial nerve deficit or sensory deficit.  Skin: Skin is warm and dry.  Psychiatric: She has a normal mood and affect. Her speech is normal.    ED Course  Procedures (including critical care time) Labs Review Labs Reviewed  CBG MONITORING, ED    Imaging Review No results found.   EKG Interpretation None      MDM Patient has history of shoulder pain for quite some time. Patient had an exacerbation of this problem in the last few days. No evidence for septic joint. Certainly no dislocation. Suspect the patient is having exacerbation of degenerative changes at multiple sites of the shoulder.  The patient is fitted with a sling, prescription for ibuprofen and  Robaxin given. Patient referred to orthopedics for additional evaluation and management.    Final diagnoses:  None    **I have reviewed nursing notes, vital signs, and all appropriate lab and imaging results for this patient.Kathie Dike, PA-C 09/30/13 1807

## 2013-10-01 NOTE — ED Provider Notes (Signed)
Medical screening examination/treatment/procedure(s) were performed by non-physician practitioner and as supervising physician I was immediately available for consultation/collaboration.   EKG Interpretation None       Virginio Isidore F Alfonso Shackett, MD 10/01/13 1440 

## 2013-12-03 ENCOUNTER — Encounter (HOSPITAL_COMMUNITY): Payer: Self-pay | Admitting: Emergency Medicine

## 2013-12-03 ENCOUNTER — Emergency Department (HOSPITAL_COMMUNITY)
Admission: EM | Admit: 2013-12-03 | Discharge: 2013-12-03 | Disposition: A | Discharge status: Home / Self Care | Payer: Self-pay | Attending: Emergency Medicine | Admitting: Emergency Medicine

## 2013-12-03 DIAGNOSIS — Z79899 Other long term (current) drug therapy: Secondary | ICD-10-CM | POA: Insufficient documentation

## 2013-12-03 DIAGNOSIS — Z7982 Long term (current) use of aspirin: Secondary | ICD-10-CM | POA: Insufficient documentation

## 2013-12-03 DIAGNOSIS — I10 Essential (primary) hypertension: Secondary | ICD-10-CM | POA: Insufficient documentation

## 2013-12-03 DIAGNOSIS — J45901 Unspecified asthma with (acute) exacerbation: Principal | ICD-10-CM

## 2013-12-03 DIAGNOSIS — IMO0002 Reserved for concepts with insufficient information to code with codable children: Secondary | ICD-10-CM | POA: Insufficient documentation

## 2013-12-03 DIAGNOSIS — J441 Chronic obstructive pulmonary disease with (acute) exacerbation: Secondary | ICD-10-CM

## 2013-12-03 DIAGNOSIS — E119 Type 2 diabetes mellitus without complications: Secondary | ICD-10-CM | POA: Insufficient documentation

## 2013-12-03 MED ORDER — DEXAMETHASONE 4 MG PO TABS: ORAL_TABLET | ORAL | Status: DC

## 2013-12-03 MED ORDER — ALBUTEROL SULFATE HFA 108 (90 BASE) MCG/ACT IN AERS
2.0000 | INHALATION_SPRAY | Freq: Once | RESPIRATORY_TRACT | Status: AC
  Administered 2013-12-03: 2 via RESPIRATORY_TRACT
  Filled 2013-12-03: qty 6.7

## 2013-12-03 NOTE — ED Notes (Signed)
Pt presents with productive cough over last several days. States phlegm is yellow in color and thick. Pt with hx of asthma. States she was wheezing but that has subsided. Lungs sounds clear via all fields on assessment.

## 2013-12-03 NOTE — ED Provider Notes (Signed)
CSN: [id number]     Arrival date & time 12/03/13  1036 History   This chart was scribed for non-physician practitioner Kathie Dike, PA-C, working with Glynn Octave, MD, by Yevette Edwards, ED Scribe. This patient was seen in room APFT22/APFT22 and the patient's care was started at 12:16 PM.  None    Chief Complaint  Patient presents with  . Cough    The history is provided by the patient. No language interpreter was used.   HPI Comments: Lindsey Melton is a 63 y.o. female, with a h/o asthma, who presents to the Emergency Department complaining of a cough productive of phlegm which has persisted for several weeks. The cough has been associated with chest congestion and sneezing. She reports she was prescribed generic Tessalon pearls with moderate relief.  She denies a fever. The pt is a non-smoker.    Past Medical History  Diagnosis Date  . Hypertension   . Diabetes mellitus   . Asthma   . COPD (chronic obstructive pulmonary disease)    Past Surgical History  Procedure Laterality Date  . Abdominal hysterectomy     No family history on file. History  Substance Use Topics  . Smoking status: Never Smoker   . Smokeless tobacco: Not on file  . Alcohol Use: Yes     Comment: Occasional    No OB history provided.  Review of Systems  Constitutional: Negative for fever.  HENT: Positive for congestion and sneezing.   Respiratory: Positive for cough.   All other systems reviewed and are negative.   Allergies  Prozac  Home Medications   Prior to Admission medications   Medication Sig Start Date End Date Taking? Authorizing Provider  albuterol (PROVENTIL HFA;VENTOLIN HFA) 108 (90 BASE) MCG/ACT inhaler Inhale 2 puffs into the lungs every 6 (six) hours as needed. Shortness of breath/wheezing    Historical Provider, MD  amoxicillin (AMOXIL) 500 MG capsule Take 1 capsule (500 mg total) by mouth 3 (three) times daily. 04/24/12   Benny Lennert, MD  aspirin EC 81 MG tablet Take  81 mg by mouth daily.    Historical Provider, MD  busPIRone (BUSPAR) 10 MG tablet Take 10 mg by mouth 3 (three) times daily.    Historical Provider, MD  diazepam (VALIUM) 5 MG tablet Take 1 tablet (5 mg total) by mouth every 8 (eight) hours as needed (muscle spasm). 09/24/12   Burgess Amor, PA-C  Fluticasone-Salmeterol (ADVAIR) 250-50 MCG/DOSE AEPB Inhale 1 puff into the lungs every 12 (twelve) hours.    Historical Provider, MD  ibuprofen (ADVIL,MOTRIN) 800 MG tablet Take 1 tablet (800 mg total) by mouth 3 (three) times daily. 09/30/13   Kathie Dike, PA-C  metFORMIN (GLUCOPHAGE) 1000 MG tablet Take 1,000 mg by mouth 2 (two) times daily.    Historical Provider, MD  predniSONE (DELTASONE) 10 MG tablet Take 2 tablets (20 mg total) by mouth daily. 04/24/12   Benny Lennert, MD  quinapril-hydrochlorothiazide (ACCURETIC) 20-12.5 MG per tablet Take 1 tablet by mouth daily.    Historical Provider, MD  quinapril-hydrochlorothiazide (ACCURETIC) 20-12.5 MG per tablet Take 1 tablet by mouth daily.    Historical Provider, MD  venlafaxine (EFFEXOR-XR) 150 MG 24 hr capsule Take 300 mg by mouth daily.    Historical Provider, MD   Triage Vitals: BP 145/87  Pulse 99  Temp(Src) 98.2 F (36.8 C) (Oral)  Resp 18  Ht 5\' 1"  (1.549 m)  Wt 160 lb (72.576 kg)  BMI 30.25 kg/m2  SpO2 98%  Physical Exam  Nursing note and vitals reviewed. Constitutional: She is oriented to person, place, and time. She appears well-developed and well-nourished. No distress.  HENT:  Head: Normocephalic and atraumatic.  Mild nasal congestion.   Eyes: EOM are normal.  Neck: Normal range of motion. Neck supple. No tracheal deviation present.  Cardiovascular: Normal rate, regular rhythm and normal heart sounds.   No murmur heard. Pulmonary/Chest: Effort normal. No respiratory distress.  Coarse breath sounds with diffuse scattered rhonchi.   Musculoskeletal: Normal range of motion. She exhibits no edema.  Lymphadenopathy:    She has  no cervical adenopathy.  Neurological: She is alert and oriented to person, place, and time.  Skin: Skin is warm and dry.  Psychiatric: She has a normal mood and affect. Her behavior is normal.    ED Course  Procedures (including critical care time)  DIAGNOSTIC STUDIES: Oxygen Saturation is 98% on room air, normal by my interpretation.    COORDINATION OF CARE:  12:19 PM- Discussed treatment plan with patient, and the patient agreed to the plan. The plan includes an inhaler and a course of steroids. Encouraged the pt to increase fluids.   Labs Review Labs Reviewed - No data to display  Imaging Review No results found.   EKG Interpretation None      MDM Within normal limits. The pulse oximetry is 98% on room air. Within normal limits by my interpretation. The patient has a history of chronic obstructive pulmonary disease, and suspect that the patient is having an exacerbation of the chronic lung disease, and or asthma. The plan at this time is for the patient to use an albuterol inhaler 2 puffs every 4 hours, a prednisone taper has also been ordered. The patient is to return to the emergency department or followup with the primary physician if not improving.    Final diagnoses:  None    *I have reviewed nursing notes, vital signs, and all appropriate lab and imaging results for this patient.**  **I personally performed the services described in this documentation, which was scribed in my presence. The recorded information has been reviewed and is accurate.* and  Kathie DikeHobson M Jacquelinne Speak, PA-C 12/03/13 1233

## 2013-12-03 NOTE — ED Notes (Signed)
Pt states she has allergies. States she has been coughing and congested for a month

## 2013-12-03 NOTE — Discharge Instructions (Signed)
Chronic Obstructive Pulmonary Disease Exacerbation °Chronic obstructive pulmonary disease (COPD) is a common lung condition in which airflow from the lungs is limited. COPD is a general term that can be used to describe many different lung problems that limit airflow, including chronic bronchitis and emphysema. COPD exacerbations are episodes when breathing symptoms become much worse and require extra treatment. Without treatment, COPD exacerbations can be life threatening, and frequent COPD exacerbations can cause further damage to your lungs. °CAUSES  °· Respiratory infections.   °· Exposure to smoke.   °· Exposure to air pollution, chemical fumes, or dust. °Sometimes there is no apparent cause or trigger. °RISK FACTORS °· Smoking cigarettes. °· Older age. °· Frequent prior COPD exacerbations. °SIGNS AND SYMPTOMS  °· Increased coughing.   °· Increased thick spit (sputum) production.   °· Increased wheezing.   °· Increased shortness of breath.   °· Rapid breathing.   °· Chest tightness. °DIAGNOSIS  °Your medical history, a physical exam, and tests will help your health care provider make a diagnosis. Tests may include: °· A chest X-ray. °· Basic lab tests. °· Sputum testing. °· An arterial blood gas test. °TREATMENT  °Depending on the severity of your COPD exacerbation, you may need to be admitted to a hospital for treatment. Some of the treatments commonly used to treat COPD exacerbations are:  °· Antibiotic medicines.   °· Bronchodilators. These are drugs that expand the air passages. They may be given with an inhaler or nebulizer. Spacer devices may be needed to help improve drug delivery. °· Corticosteroid medicines. °· Supplemental oxygen therapy.   °HOME CARE INSTRUCTIONS  °· Do not smoke. Quitting smoking is very important to prevent COPD from getting worse and exacerbations from happening as often. °· Avoid exposure to all substances that irritate the airway, especially to tobacco smoke.   °· If prescribed,  take your antibiotics as directed. Finish them even if you start to feel better. °· Only take over-the-counter or prescription medicines as directed by your health care provider. It is important to use correct technique with inhaled medicines. °· Drink enough fluids to keep your urine clear or pale yellow (unless you have a medical condition that requires fluid restriction). °· Use a cool mist vaporizer. This makes it easier to clear your chest when you cough.   °· If you have a home nebulizer and oxygen, continue to use them as directed.   °· Maintain all necessary vaccinations to prevent infections.   °· Exercise regularly.   °· Eat a healthy diet.   °· Keep all follow-up appointments as directed by your health care provider. °SEEK IMMEDIATE MEDICAL CARE IF: °· You have worsening shortness of breath.   °· You have trouble talking.   °· You have severe chest pain. °· You have blood in your sputum.  °· You have a fever. °· You have weakness, vomit repeatedly, or faint.   °· You feel confused.   °· You continue to get worse. °MAKE SURE YOU:  °· Understand these instructions. °· Will watch your condition. °· Will get help right away if you are not doing well or get worse. °Document Released: 04/25/2007 Document Revised: 04/18/2013 Document Reviewed: 03/02/2013 °Exit[employer]® Patient Information ©2014 [employer]. ° °

## 2013-12-03 NOTE — ED Provider Notes (Signed)
Medical screening examination/treatment/procedure(s) were performed by non-physician practitioner and as supervising physician I was immediately available for consultation/collaboration.   EKG Interpretation None        Mikeya Tomasetti, MD 12/03/13 1500 

## 2014-06-15 ENCOUNTER — Emergency Department (HOSPITAL_COMMUNITY): Payer: Self-pay

## 2014-06-15 ENCOUNTER — Encounter (HOSPITAL_COMMUNITY): Payer: Self-pay | Admitting: Emergency Medicine

## 2014-06-15 ENCOUNTER — Emergency Department (HOSPITAL_COMMUNITY)
Admission: EM | Admit: 2014-06-15 | Discharge: 2014-06-15 | Disposition: A | Discharge status: Home / Self Care | Payer: Self-pay | Attending: Emergency Medicine | Admitting: Emergency Medicine

## 2014-06-15 DIAGNOSIS — R062 Wheezing: Secondary | ICD-10-CM

## 2014-06-15 DIAGNOSIS — Z79899 Other long term (current) drug therapy: Secondary | ICD-10-CM | POA: Insufficient documentation

## 2014-06-15 DIAGNOSIS — Z7952 Long term (current) use of systemic steroids: Secondary | ICD-10-CM | POA: Insufficient documentation

## 2014-06-15 DIAGNOSIS — Z791 Long term (current) use of non-steroidal anti-inflammatories (NSAID): Secondary | ICD-10-CM | POA: Insufficient documentation

## 2014-06-15 DIAGNOSIS — I10 Essential (primary) hypertension: Secondary | ICD-10-CM | POA: Insufficient documentation

## 2014-06-15 DIAGNOSIS — J40 Bronchitis, not specified as acute or chronic: Secondary | ICD-10-CM

## 2014-06-15 DIAGNOSIS — J441 Chronic obstructive pulmonary disease with (acute) exacerbation: Secondary | ICD-10-CM | POA: Insufficient documentation

## 2014-06-15 DIAGNOSIS — E119 Type 2 diabetes mellitus without complications: Secondary | ICD-10-CM | POA: Insufficient documentation

## 2014-06-15 DIAGNOSIS — Z792 Long term (current) use of antibiotics: Secondary | ICD-10-CM | POA: Insufficient documentation

## 2014-06-15 MED ORDER — AZITHROMYCIN 250 MG PO TABS: ORAL_TABLET | ORAL | Status: DC

## 2014-06-15 MED ORDER — IPRATROPIUM-ALBUTEROL 0.5-2.5 (3) MG/3ML IN SOLN
3.0000 mL | Freq: Once | RESPIRATORY_TRACT | Status: AC
  Administered 2014-06-15: 3 mL via RESPIRATORY_TRACT
  Filled 2014-06-15: qty 3

## 2014-06-15 MED ORDER — PREDNISONE 50 MG PO TABS: ORAL_TABLET | ORAL | Status: DC

## 2014-06-15 NOTE — Discharge Instructions (Signed)
Chest x-ray shows no pneumonia.  Prescriptions for antibiotic, prednisone.  Usually inhaler as needed. Increase fluids. Good handwashing.

## 2014-06-15 NOTE — ED Notes (Signed)
Wheezing diminished after neb treatment.

## 2014-06-15 NOTE — ED Provider Notes (Signed)
CSN: [id number]     Arrival date & time 06/15/14  1134 History   First MD Initiated Contact with Patient 06/15/14 1315     Chief Complaint  Patient presents with  . Cough     (Consider location/radiation/quality/duration/timing/severity/associated sxs/prior Treatment) HPI.... Cough with productive sputum for 2 months. No fever, chills, rusty sputum. Nonsmoker. Past medical history asthma/COPD. Her inhaler helps symptoms. No obvious substernal chest pain or dyspnea.  Past Medical History  Diagnosis Date  . Hypertension   . Diabetes mellitus   . Asthma   . COPD (chronic obstructive pulmonary disease)    Past Surgical History  Procedure Laterality Date  . Abdominal hysterectomy     History reviewed. No pertinent family history. History  Substance Use Topics  . Smoking status: Never Smoker   . Smokeless tobacco: Not on file  . Alcohol Use: Yes     Comment: Occasional    OB History    No data available     Review of Systems  All other systems reviewed and are negative.     Allergies  Prozac  Home Medications   Prior to Admission medications   Medication Sig Start Date End Date Taking? Authorizing Provider  Aspirin-Salicylamide-Caffeine (BC HEADACHE PO) Take 1 Package by mouth 2 (two) times daily as needed (pain).   Yes Historical Provider, MD  busPIRone (BUSPAR) 10 MG tablet Take 10 mg by mouth 3 (three) times daily.   Yes Historical Provider, MD  CRESTOR 20 MG tablet Take 20 mg by mouth at bedtime. 04/29/14  Yes Historical Provider, MD  esomeprazole (NEXIUM) 40 MG packet Take 40 mg by mouth daily before breakfast.   Yes Historical Provider, MD  glipiZIDE (GLUCOTROL) 10 MG tablet Take 10 mg by mouth 2 (two) times daily. 03/26/14  Yes Historical Provider, MD  JANUVIA 100 MG tablet Take 100 mg by mouth daily. 06/04/14  Yes Historical Provider, MD  meloxicam (MOBIC) 15 MG tablet Take 15 mg by mouth daily.   Yes Historical Provider, MD  metFORMIN (GLUCOPHAGE) 1000 MG  tablet Take 1,000 mg by mouth 2 (two) times daily.   Yes Historical Provider, MD  mometasone-formoterol (DULERA) 100-5 MCG/ACT AERO Inhale 2 puffs into the lungs 2 (two) times daily.   Yes Historical Provider, MD  NEXIUM 40 MG capsule Take 40 mg by mouth daily. 03/19/14  Yes Historical Provider, MD  quinapril-hydrochlorothiazide (ACCURETIC) 20-12.5 MG per tablet Take 1 tablet by mouth daily.   Yes Historical Provider, MD  venlafaxine XR (EFFEXOR-XR) 75 MG 24 hr capsule Take 75 mg by mouth daily with breakfast.   Yes Historical Provider, MD  azithromycin (ZITHROMAX Z-PAK) 250 MG tablet 2 po day one, then 1 daily x 4 days 06/15/14   Donnetta HutchingBrian Rhylynn Perdomo, MD  dexamethasone (DECADRON) 4 MG tablet 1 po bid with food Patient not taking: Reported on 06/15/2014 12/03/13   Kathie DikeHobson M Bryant, PA-C  predniSONE (DELTASONE) 50 MG tablet 1 tablet daily for 4 days, one half tablet daily for 4 days 06/15/14   Donnetta HutchingBrian Zadyn Yardley, MD   BP 119/81 mmHg  Pulse 90  Temp(Src) 98.3 F (36.8 C) (Oral)  Resp 20  Ht 5\' 1"  (1.549 m)  Wt 165 lb (74.844 kg)  BMI 31.19 kg/m2  SpO2 96% Physical Exam  Constitutional: She is oriented to person, place, and time. She appears well-developed and well-nourished.  HENT:  Head: Normocephalic and atraumatic.  Eyes: Conjunctivae and EOM are normal. Pupils are equal, round, and reactive to light.  Neck: Normal  range of motion. Neck supple.  Cardiovascular: Normal rate, regular rhythm and normal heart sounds.   Pulmonary/Chest: Effort normal and breath sounds normal.  Abdominal: Soft. Bowel sounds are normal.  Musculoskeletal: Normal range of motion.  Neurological: She is alert and oriented to person, place, and time.  Skin: Skin is warm and dry.  Psychiatric: She has a normal mood and affect. Her behavior is normal.  Nursing note and vitals reviewed.   ED Course  Procedures (including critical care time) Labs Review Labs Reviewed - No data to display  Imaging Review Dg Chest 2 View  06/15/2014    CLINICAL DATA:  63 year old female with history of cough, wheezing and congestion for 1 month, progressively worsening. Chest tightness.  EXAM: CHEST  2 VIEW  COMPARISON:  A chest x-ray 04/24/2012.  FINDINGS: Lung volumes are normal. No consolidative airspace disease. No pleural effusions. No pneumothorax. No pulmonary nodule or mass noted. Pulmonary vasculature and the cardiomediastinal silhouette are within normal limits.  IMPRESSION: No radiographic evidence of acute cardiopulmonary disease.   Electronically Signed   By: Trudie Reedaniel  Entrikin M.D.   On: 06/15/2014 13:01     EKG Interpretation None      MDM   Final diagnoses:  Wheezing  Bronchitis    Patient is in no acute distress. Chest x-ray negative. Patient has had persistent symptoms for 2 months. Will Rx Zithromax, prednisone. She has an albuterol inhaler at home.    Donnetta HutchingBrian Yolanda Huffstetler, MD 06/15/14 [phone]

## 2014-06-15 NOTE — ED Notes (Signed)
Patient complaining of cough "for a couple of months." States coughing up thick, white mucous. States "I am tired of coughing."

## 2014-06-15 NOTE — ED Notes (Signed)
Cough w/white mucous production x 2 months.  Denies any fever, chills, n/v. Occasional chest tightness and epigastric burning and constant SOB.  States she "coughs so hard and long I don't think I'm going to stop".  Has been using her Dulera INH for Asthma/COPD which temporarily helps.  Expiratory wheezes auscultated throughout lung fields.  Able to speak in complete sentences.  Does not appear acutely SOB at rest or with ambulation.

## 2014-06-15 NOTE — ED Notes (Signed)
Patient with no complaints at this time. Respirations even and unlabored. Skin warm/dry. Discharge instructions reviewed with patient at this time. Patient given opportunity to voice concerns/ask questions. Patient discharged at this time and left Emergency Department with steady gait.   

## 2014-08-29 ENCOUNTER — Emergency Department (HOSPITAL_COMMUNITY)
Admission: EM | Admit: 2014-08-29 | Discharge: 2014-08-29 | Disposition: A | Discharge status: Home / Self Care | Payer: Self-pay | Attending: Emergency Medicine | Admitting: Emergency Medicine

## 2014-08-29 ENCOUNTER — Encounter (HOSPITAL_COMMUNITY): Payer: Self-pay | Admitting: *Deleted

## 2014-08-29 ENCOUNTER — Emergency Department (HOSPITAL_COMMUNITY): Payer: Self-pay

## 2014-08-29 DIAGNOSIS — J449 Chronic obstructive pulmonary disease, unspecified: Secondary | ICD-10-CM | POA: Insufficient documentation

## 2014-08-29 DIAGNOSIS — R05 Cough: Secondary | ICD-10-CM

## 2014-08-29 DIAGNOSIS — Z791 Long term (current) use of non-steroidal anti-inflammatories (NSAID): Secondary | ICD-10-CM | POA: Insufficient documentation

## 2014-08-29 DIAGNOSIS — Z792 Long term (current) use of antibiotics: Secondary | ICD-10-CM | POA: Insufficient documentation

## 2014-08-29 DIAGNOSIS — E119 Type 2 diabetes mellitus without complications: Secondary | ICD-10-CM | POA: Insufficient documentation

## 2014-08-29 DIAGNOSIS — Z79899 Other long term (current) drug therapy: Secondary | ICD-10-CM | POA: Insufficient documentation

## 2014-08-29 DIAGNOSIS — I10 Essential (primary) hypertension: Secondary | ICD-10-CM | POA: Insufficient documentation

## 2014-08-29 DIAGNOSIS — J40 Bronchitis, not specified as acute or chronic: Secondary | ICD-10-CM

## 2014-08-29 DIAGNOSIS — Z7982 Long term (current) use of aspirin: Secondary | ICD-10-CM | POA: Insufficient documentation

## 2014-08-29 DIAGNOSIS — R059 Cough, unspecified: Secondary | ICD-10-CM

## 2014-08-29 DIAGNOSIS — H81399 Other peripheral vertigo, unspecified ear: Secondary | ICD-10-CM | POA: Insufficient documentation

## 2014-08-29 HISTORY — DX: Headache, unspecified: R51.9

## 2014-08-29 HISTORY — DX: Headache: R51

## 2014-08-29 HISTORY — DX: Other peripheral vertigo, unspecified ear: H81.399

## 2014-08-29 LAB — BASIC METABOLIC PANEL
Anion gap: 6 (ref 5–15)
BUN: 14 mg/dL (ref 6–23)
CO2: 24 mmol/L (ref 19–32)
Calcium: 9.1 mg/dL (ref 8.4–10.5)
Chloride: 106 mmol/L (ref 96–112)
Creatinine, Ser: 0.8 mg/dL (ref 0.50–1.10)
GFR calc Af Amer: 89 mL/min — ABNORMAL LOW (ref >90)
GFR calc non Af Amer: 77 mL/min — ABNORMAL LOW (ref >90)
Glucose, Bld: 126 mg/dL — ABNORMAL HIGH (ref 70–99)
Potassium: 4.1 mmol/L (ref 3.5–5.1)
Sodium: 136 mmol/L (ref 135–145)

## 2014-08-29 LAB — CBC WITH DIFFERENTIAL/PLATELET
Basophils Absolute: 0.1 10*3/uL (ref 0.0–0.1)
Basophils Relative: 1 % (ref 0–1)
Eosinophils Absolute: 0.2 10*3/uL (ref 0.0–0.7)
Eosinophils Relative: 4 % (ref 0–5)
HCT: 36.3 % (ref 36.0–46.0)
Hemoglobin: 11.7 g/dL — ABNORMAL LOW (ref 12.0–15.0)
Lymphocytes Relative: 20 % (ref 12–46)
Lymphs Abs: 1 10*3/uL (ref 0.7–4.0)
MCH: 27.5 pg (ref 26.0–34.0)
MCHC: 32.2 g/dL (ref 30.0–36.0)
MCV: 85.4 fL (ref 78.0–100.0)
Monocytes Absolute: 0.6 10*3/uL (ref 0.1–1.0)
Monocytes Relative: 11 % (ref 3–12)
Neutro Abs: 3.2 10*3/uL (ref 1.7–7.7)
Neutrophils Relative %: 64 % (ref 43–77)
Platelets: 310 10*3/uL (ref 150–400)
RBC: 4.25 MIL/uL (ref 3.87–5.11)
RDW: 13.8 % (ref 11.5–15.5)
WBC: 5.1 10*3/uL (ref 4.0–10.5)

## 2014-08-29 MED ORDER — PREDNISONE 50 MG PO TABS
60.0000 mg | ORAL_TABLET | Freq: Once | ORAL | Status: AC
  Administered 2014-08-29: 60 mg via ORAL
  Filled 2014-08-29 (×2): qty 1

## 2014-08-29 MED ORDER — ALBUTEROL SULFATE HFA 108 (90 BASE) MCG/ACT IN AERS
2.0000 | INHALATION_SPRAY | RESPIRATORY_TRACT | Status: AC
  Administered 2014-08-29: 2 via RESPIRATORY_TRACT
  Filled 2014-08-29: qty 6.7

## 2014-08-29 MED ORDER — IPRATROPIUM-ALBUTEROL 0.5-2.5 (3) MG/3ML IN SOLN
3.0000 mL | Freq: Once | RESPIRATORY_TRACT | Status: AC
  Administered 2014-08-29: 3 mL via RESPIRATORY_TRACT
  Filled 2014-08-29: qty 3

## 2014-08-29 MED ORDER — PREDNISONE 20 MG PO TABS: 40.0000 mg | ORAL_TABLET | Freq: Every day | ORAL | Status: DC

## 2014-08-29 MED ORDER — BENZONATATE 100 MG PO CAPS: 100.0000 mg | ORAL_CAPSULE | Freq: Three times a day (TID) | ORAL | Status: DC | PRN

## 2014-08-29 NOTE — ED Notes (Signed)
Respiratory made aware of breathing treatment. 

## 2014-08-29 NOTE — Discharge Instructions (Signed)
°Emergency Department Resource Guide °1) Find a Doctor and Pay Out of Pocket °Although you won't have to find out who is covered by your insurance plan, it is a good idea to ask around and get recommendations. You will then need to call the office and see if the doctor you have chosen will accept you as a new patient and what types of options they offer for patients who are self-pay. Some doctors offer discounts or will set up payment plans for their patients who do not have insurance, but you will need to ask so you aren't surprised when you get to your appointment. ° °2) Contact Your Local Health Department °Not all health departments have doctors that can see patients for sick visits, but many do, so it is worth a call to see if yours does. If you don't know where your local health department is, you can check in your phone book. The CDC also has a tool to help you locate your state's health department, and many state websites also have listings of all of their local health departments. ° °3) Find a Walk-in Clinic °If your illness is not likely to be very severe or complicated, you may want to try a walk in clinic. These are popping up all over the country in pharmacies, drugstores, and shopping centers. They're usually staffed by nurse practitioners or physician assistants that have been trained to treat common illnesses and complaints. They're usually fairly quick and inexpensive. However, if you have serious medical issues or chronic medical problems, these are probably not your best option. ° °No Primary Care Doctor: °- Call Health Connect at  832-8000 - they can help you locate a primary care doctor that  accepts your insurance, provides certain services, etc. °- Physician Referral Service- 1-800-533-3463 ° °Chronic Pain Problems: °Organization         Address  Phone   Notes  °[facility]  (336) 297-2271 Patients need to be referred by their primary care doctor.  ° °Medication  Assistance: °Organization         Address  Phone   Notes  °[place] [facility] [address] °[place], [place] [address] (336) 641-8030 --Must be a resident of [place] °-- Must have NO insurance coverage whatsoever (no Medicaid/ Medicare, etc.) °-- The pt. MUST have a primary care doctor that directs their care regularly and follows them in the community °  °[facility]  (866) 331-1348   °[employer]  (888) 892-1162   ° °Agencies that provide inexpensive medical care: °Organization         Address  Phone   Notes  °[facility]  (336) 832-8035   °[facility]    (336) 832-7272   °[facility] [address] °[place], [place] [address] (336) 832-4777   °[facility] [address], °[place] (336) 271-4999   °[facility]    (336) 373-0678   °[facility]    (336) 272-1050   °[facility] ° [address], [place] Phone:  (336) 832-4444, Fax:  (336) 832-4440 Hours of Operation:  9 am - 6 pm, M-F.  Also accepts Medicaid/Medicare and self-pay.  °[facility] ° [address], [place] Phone: (336) 832-3150, Fax: (336) [phone]. Hours of Operation:  8:30 am - 5:30 pm, M-F.  Also accepts Medicaid and self-pay.  °[facility] [place] [address], [place] Phone: (336) 878-6027   °[facility] [address], [place], [place] (336)723-1848, Ext. 123 Mondays & Thursdays: 7-9 AM.  First 15 patients are seen on a first come, first serve basis. °  ° °Medicaid-accepting [place] Providers: ° °Organization         Address  Phone   Notes  °[facility] [address], [place] (336) 641-2100 Also accepts self-pay patients.  °[facility] [address], [place] ° (336) 856-9996   °[facility] [address], [place]  (336) 288-8857   °[facility] [address] [place] [address], [place] (336) 299-7000   °Veita Bland [address], [place]  ° (336) 373-1557 Only accepts [place] Access Medicaid patients after they have their name applied to their card.  ° °Self-Pay (no insurance) in [place]: ° °Organization         Address  Phone   Notes  °Sickle Cell Patients, [facility] [address], [place] (336) 832-1970   °[facility] [address], [place] (336) 832-4400   °[facility] ° [address], [place] (336) 992-4800   °[facility]/Dr. Osei-Bonsu ° [address] [place] [address], [place] or [address], [place] (336) 841-8500 Phone number for both [place] and [place] locations is the same.  °[facility] [address], [place] (336) 299-0000   °[facility] [address] [place] [address], [place] or [address] (336) 852-7530 °(336) 878-2260   °[facility] [address], [place] (336) 350-1642, phone; (336) 294-5005, fax Sees patients 1st and 3rd Saturday of every month.  Must not qualify for public or private insurance (i.e. Medicaid, Medicare, [place] Health Choice, Veterans' Benefits) • Household income should be no more than 200% of the poverty level •The clinic cannot treat you if you are pregnant or think you are pregnant • Sexually transmitted diseases are not treated at the clinic.  ° ° °Dental Care: °Organization         Address  Phone  Notes  °[place] [facility] [address], [place] (336) 641-6152 Accepts children up to age 21 who are enrolled in Medicaid or [place] Health Choice; pregnant women with a Medicaid card; and children who have applied for Medicaid or [place] Health Choice, but were declined, whose parents can pay a reduced fee at time of service.  °[place]  [facility] [place]  [address], [place] (336) 641-7733 Accepts children up to age 21 who are enrolled in Medicaid or [place] Health Choice; pregnant women with a Medicaid card; and children who have applied for Medicaid or [place] Health Choice, but were declined, whose parents can pay a reduced fee at time of service.  °[facility] ° [address], [place] (336) 641-4533 Patients are seen by appointment only. Walk-ins are not accepted. [facility] will see patients 18 years of age and older. °Monday - Tuesday (8am-5pm) °Most Wednesdays (8:30-5pm) °$30 per visit, cash only  °[facility] ° [address], [place] (336) 641-4533 Patients are seen by appointment only. Walk-ins are not accepted. [facility] will see patients 18 years of age and older. °One   Wednesday Evening (Monthly: Volunteer Based).  $30 per visit, cash only  °[facility]  (919) 537-3737 for adults; Children under age 4, call Graduate Pediatric Dentistry at ([phone]) [phone]. Children aged 4-14, please call (919) [phone] to request a pediatric application. ° Dental services are provided in all areas of dental care including fillings, crowns and bridges, complete and partial dentures, implants, gum treatment, root canals, and extractions. Preventive care is also provided. Treatment is provided to both adults and children. °Patients are selected via a lottery and there is often a waiting list. °  °[facility] [address], °[place] ° (336) 763-8833 [email or web address] °  °[facility] [address], [place], [place] (336)723-1848, Ext. 123 Second and Fourth Thursday of each month, opens at 6:30 AM; Clinic ends at 9 AM.  Patients are seen on a first-come first-served basis, and a limited number are seen during each clinic.  ° °[facility] ° [address], [place], [place] (336) 723-7904    Eligibility Requirements °You must have lived in [place], [place], or [place] counties for at least the last three months. °  You cannot be eligible for state or federal sponsored healthcare insurance, including Veterans Administration, Medicaid, or Medicare. °  You generally cannot be eligible for healthcare insurance through your employer.  °  How to apply: °Eligibility screenings are held every Tuesday and Wednesday afternoon from 1:00 pm until 4:00 pm. You do not need an appointment for the interview!  °[facility] [address], [place], [place] 336-631-2330   °[facility]  336-342-8273   °[facility]  336-703-3100   °[facility]  336-570-6415   ° °Behavioral Health Resources in the Community: °Intensive Outpatient Programs °Organization         Address  Phone  Notes  °[facility] [address], [place], [place] 336-878-6098   °[facility] [address], [place], [place] 336-832-9800   °[facility] [address], [place], [place] ° 336-882-2125   °[facility] [address],  °[place], [place] 1-800-853-5163 or 336-641-4981   °Substance Abuse Resources °Organization         Address  Phone  Notes  °[facility]  336-882-2125   °[facility]  336-784-9470   °[facility]  336-285-9073   °[facility]  336-845-3988   °Residential & Outpatient Substance Abuse Program  1-800-659-3381   °Psychological Services °Organization         Address  Phone  Notes  °[facility]  336- 832-9600   °[facility]  336- 378-7881   °[facility] [address], [place] 1-800-853-5163 or 336-641-4981   ° °Mobile Crisis Teams °Organization         Address  Phone  Notes  °[facility], Mobile Crisis Care Unit  1-877-626-1772   °Assertive °[facility] ° [address]  [place], [place] 336-834-9664   °Sharon DeEsch [address] °[place] 336-554-5454   ° °Self-Help/Support Groups °Organization         Address  Phone             Notes  °[facility] of  - variety of support groups  336- 373-1402 Call for more information  °Narcotics Anonymous (NA), [facility] [address], °[place]  2 meetings at this location  ° °  Residential Treatment Programs Organization         Address  Phone  Notes  [facility] [address],    [place]  [phone]   [facility]  [address], [place] [phone], [place], [place] 284-132-4401([phone]   [facility] [address], [place] [phone] Admissions: 8am-3pm M-F  [facility] [address]    [place], [place] [phone]   [facility] [address], [place], [place] [phone]   [facility] [address]  [place], [place] [phone]   [facility] Intensive Outpatient [address], [place], [place] [phone]   [facility] ([facility] [address]  [place], [place] [phone])[phone] or ([phone]   [facility] ([facility] [address] [place], [place] 315-176-1607(902)343-6796 Accepts Medicaid  [facility] [address]  [place] [phone] Substance Abuse/Addiction Treatment   [facility]         Address  Phone  Notes  [facility]  807-835-0731(888) 831-106-4385   Angie FavaJulie Brannon, PhD [address], [address] [place], [place]   [phone](336) [phone] or (701)[phone](336) ([phone]   [facility]   [address] [place], [place] (269) [phone](336) ([phone]) [phone]   [facility] [address], [place], [place] [phone](336) [phone] Insurance/Medicaid/sponsorship through [facility] [address]                                    [place], [place] [phone](336) [phone] Therapy/tele-psych/case    [facility] [address]   [place], [place] [phone](336) [phone]    Dr. Lolly MustacheArfeen  [phone](336) [phone]   [facility]  [employer] [facility] 1) [address], [place] 2) [address], [place] 3)  [address], [place] [phone](336) ([phone](336) [phone]  [phone](336) [phone]   [facility] Child Abuse Hotline 714-864-1941(336) 7326833638 or [phone](336) [phone] (After Hours)      Take the prescriptions as directed.  Use your albuterol inhaler (2 to 4 puffs) every 4 hours for the next 7 days, then as needed for cough, wheezing, or shortness of breath.  Call your regular medical doctor tomorrow morning to schedule a follow up appointment within the next 3 to 4 days.  Return to the Emergency Department immediately sooner if worsening.

## 2014-08-29 NOTE — ED Notes (Signed)
Pt states cough is better since breathing treatment and is ready to go home.

## 2014-08-29 NOTE — ED Notes (Signed)
Cough,x 2 weeks, clear sputum  No fever.  No N/V  Alert, NAD  Has red rash she wants checked also

## 2014-08-29 NOTE — ED Provider Notes (Signed)
CSN: [id number]     Arrival date & time 08/29/14  1340 History   First MD Initiated Contact with Patient 08/29/14 1802     Chief Complaint  Patient presents with  . Cough      HPI Pt was seen at 1815.  Per pt, c/o gradual onset and worsening of persistent cough for the past 1 week.  Describes her symptoms as "my asthma is acting up."  Has been using home MDI with transient relief.  Denies CP/palpitations, no back pain, no abd pain, no N/V/D, no fevers, no rash.     Past Medical History  Diagnosis Date  . Hypertension   . Diabetes mellitus   . Asthma   . COPD (chronic obstructive pulmonary disease)   . Peripheral vertigo   . Headache    Past Surgical History  Procedure Laterality Date  . Abdominal hysterectomy      History  Substance Use Topics  . Smoking status: Never Smoker   . Smokeless tobacco: Not on file  . Alcohol Use: Yes     Comment: Occasional     Review of Systems ROS: Statement: All systems negative except as marked or noted in the HPI; Constitutional: Negative for fever and chills. ; ; Eyes: Negative for eye pain, redness and discharge. ; ; ENMT: Negative for ear pain, hoarseness, nasal congestion, sinus pressure and sore throat. ; ; Cardiovascular: Negative for chest pain, palpitations, diaphoresis, dyspnea and peripheral edema. ; ; Respiratory: +cough. Negative for wheezing and stridor. ; ; Gastrointestinal: Negative for nausea, vomiting, diarrhea, abdominal pain, blood in stool, hematemesis, jaundice and rectal bleeding. . ; ; Genitourinary: Negative for dysuria, flank pain and hematuria. ; ; Musculoskeletal: Negative for back pain and neck pain. Negative for swelling and trauma.; ; Skin: Negative for pruritus, rash, abrasions, blisters, bruising and skin lesion.; ; Neuro: Negative for headache, lightheadedness and neck stiffness. Negative for weakness, altered level of consciousness , altered mental status, extremity weakness, paresthesias, involuntary movement,  seizure and syncope.        Allergies  Prozac  Home Medications   Prior to Admission medications   Medication Sig Start Date End Date Taking? Authorizing Provider  Aspirin-Salicylamide-Caffeine (BC HEADACHE PO) Take 1 Package by mouth 2 (two) times daily as needed (pain).   Yes Historical Provider, MD  busPIRone (BUSPAR) 10 MG tablet Take 10 mg by mouth 3 (three) times daily.   Yes Historical Provider, MD  CRESTOR 20 MG tablet Take 20 mg by mouth at bedtime. 04/29/14  Yes Historical Provider, MD  glipiZIDE (GLUCOTROL) 10 MG tablet Take 10 mg by mouth daily.  03/26/14  Yes Historical Provider, MD  JANUVIA 100 MG tablet Take 100 mg by mouth daily. 06/04/14  Yes Historical Provider, MD  meloxicam (MOBIC) 15 MG tablet Take 15 mg by mouth daily.   Yes Historical Provider, MD  metFORMIN (GLUCOPHAGE) 1000 MG tablet Take 1,000 mg by mouth 2 (two) times daily.   Yes Historical Provider, MD  mometasone-formoterol (DULERA) 100-5 MCG/ACT AERO Inhale 2 puffs into the lungs 2 (two) times daily.   Yes Historical Provider, MD  NEXIUM 40 MG capsule Take 40 mg by mouth daily. 03/19/14  Yes Historical Provider, MD  quinapril-hydrochlorothiazide (ACCURETIC) 20-12.5 MG per tablet Take 1 tablet by mouth daily.   Yes Historical Provider, MD  venlafaxine XR (EFFEXOR-XR) 75 MG 24 hr capsule Take 75 mg by mouth daily with breakfast.   Yes Historical Provider, MD  azithromycin (ZITHROMAX Z-PAK) 250 MG tablet 2  po day one, then 1 daily x 4 days Patient not taking: Reported on 08/29/2014 06/15/14   Donnetta HutchingBrian Cook, MD  dexamethasone (DECADRON) 4 MG tablet 1 po bid with food Patient not taking: Reported on 06/15/2014 12/03/13   Kathie DikeHobson M Bryant, PA-C  predniSONE (DELTASONE) 50 MG tablet 1 tablet daily for 4 days, one half tablet daily for 4 days Patient not taking: Reported on 08/29/2014 06/15/14   Donnetta HutchingBrian Cook, MD   BP 145/99 mmHg  Pulse 95  Temp(Src) 98.4 F (36.9 C) (Oral)  Resp 22  Ht 5\' 1"  (1.549 m)  Wt 160 lb (72.576  kg)  BMI 30.25 kg/m2  SpO2 100% Physical Exam  1820: Physical examination:  Nursing notes reviewed; Vital signs and O2 SAT reviewed;  Constitutional: Well developed, Well nourished, Well hydrated, In no acute distress; Head:  Normocephalic, atraumatic; Eyes: EOMI, PERRL, No scleral icterus; ENMT: TM's clear bilat. +edemetous nasal turbinates bilat with clear rhinorrhea. Mouth and pharynx normal, Mucous membranes moist; Neck: Supple, Full range of motion, No lymphadenopathy; Cardiovascular: Regular rate and rhythm, No gallop; Respiratory: Breath sounds coarse & equal bilaterally, faint wheezes. No audible wheezing. Speaking full sentences with ease, Normal respiratory effort/excursion; Chest: Nontender, Movement normal; Abdomen: Soft, Nontender, Nondistended, Normal bowel sounds; Genitourinary: No CVA tenderness; Extremities: Pulses normal, No tenderness, No edema, No calf edema or asymmetry.; Neuro: AA&Ox3, Major CN grossly intact.  Speech clear. No gross focal motor or sensory deficits in extremities. Climbs on and off stretcher easily by herself. Gait steady.; Skin: Color normal, Warm, Dry.    ED Course  Procedures     EKG Interpretation None      MDM  MDM Reviewed: previous chart, nursing note and vitals Reviewed previous: labs Interpretation: labs and x-ray   Results for orders placed or performed during the hospital encounter of 08/29/14  CBC with Differential  Result Value Ref Range   WBC 5.1 4.0 - 10.5 K/uL   RBC 4.25 3.87 - 5.11 MIL/uL   Hemoglobin 11.7 (L) 12.0 - 15.0 g/dL   HCT 16.136.3 09.636.0 - 04.546.0 %   MCV 85.4 78.0 - 100.0 fL   MCH 27.5 26.0 - 34.0 pg   MCHC 32.2 30.0 - 36.0 g/dL   RDW 40.913.8 81.111.5 - 91.415.5 %   Platelets 310 150 - 400 K/uL   Neutrophils Relative % 64 43 - 77 %   Lymphocytes Relative 20 12 - 46 %   Monocytes Relative 11 3 - 12 %   Eosinophils Relative 4 0 - 5 %   Basophils Relative 1 0 - 1 %   Neutro Abs 3.2 1.7 - 7.7 K/uL   Lymphs Abs 1.0 0.7 - 4.0 K/uL    Monocytes Absolute 0.6 0.1 - 1.0 K/uL   Eosinophils Absolute 0.2 0.0 - 0.7 K/uL   Basophils Absolute 0.1 0.0 - 0.1 K/uL   RBC Morphology POLYCHROMASIA PRESENT    WBC Morphology WHITE COUNT CONFIRMED ON SMEAR    Smear Review PLATELET COUNT CONFIRMED BY SMEAR   Basic metabolic panel  Result Value Ref Range   Sodium 136 135 - 145 mmol/L   Potassium 4.1 3.5 - 5.1 mmol/L   Chloride 106 96 - 112 mmol/L   CO2 24 19 - 32 mmol/L   Glucose, Bld 126 (H) 70 - 99 mg/dL   BUN 14 6 - 23 mg/dL   Creatinine, Ser 7.820.80 0.50 - 1.10 mg/dL   Calcium 9.1 8.4 - 95.610.5 mg/dL   GFR calc non Af Amer 77 (  L) >90 mL/min   GFR calc Af Amer 89 (L) >90 mL/min   Anion gap 6 5 - 15   Dg Chest 2 View 08/29/2014   CLINICAL DATA:  Chest pain, productive cough  EXAM: CHEST  2 VIEW  COMPARISON:  06/15/2014  FINDINGS: Cardiomediastinal silhouette is stable. No acute infiltrate or pleural effusion. No pulmonary edema. Mild hyperinflation. Stable mild degenerative changes thoracic spine.  IMPRESSION: No active disease.  Mild hyperinflation.   Electronically Signed   By: Natasha Mead M.D.   On: 08/29/2014 16:19    1945:  Pt states she "feels better" after neb and steroid.  NAD, lungs CTA bilat, no wheezing, resps easy, speaking full sentences, Sats 100% R/A. Pt wants to go home now. Dx and testing d/w pt.  Questions answered.  Verb understanding, agreeable to d/c home with outpt f/u.    Samuel Jester, DO 09/01/14 [phone]

## 2014-08-29 NOTE — ED Notes (Signed)
Discharge instructions given, pt demonstrated teach back and verbal understanding. No concerns voiced.  

## 2014-09-20 ENCOUNTER — Emergency Department (HOSPITAL_COMMUNITY): Payer: 59

## 2014-09-20 ENCOUNTER — Encounter (HOSPITAL_COMMUNITY): Payer: Self-pay | Admitting: *Deleted

## 2014-09-20 ENCOUNTER — Emergency Department (HOSPITAL_COMMUNITY)
Admission: EM | Admit: 2014-09-20 | Discharge: 2014-09-20 | Disposition: A | Discharge status: Home / Self Care | Payer: 59 | Attending: Emergency Medicine | Admitting: Emergency Medicine

## 2014-09-20 DIAGNOSIS — E119 Type 2 diabetes mellitus without complications: Secondary | ICD-10-CM | POA: Diagnosis not present

## 2014-09-20 DIAGNOSIS — R42 Dizziness and giddiness: Secondary | ICD-10-CM | POA: Insufficient documentation

## 2014-09-20 DIAGNOSIS — J449 Chronic obstructive pulmonary disease, unspecified: Secondary | ICD-10-CM | POA: Insufficient documentation

## 2014-09-20 DIAGNOSIS — E860 Dehydration: Secondary | ICD-10-CM | POA: Diagnosis not present

## 2014-09-20 DIAGNOSIS — Z7952 Long term (current) use of systemic steroids: Secondary | ICD-10-CM | POA: Insufficient documentation

## 2014-09-20 DIAGNOSIS — R61 Generalized hyperhidrosis: Secondary | ICD-10-CM | POA: Insufficient documentation

## 2014-09-20 DIAGNOSIS — H538 Other visual disturbances: Secondary | ICD-10-CM | POA: Insufficient documentation

## 2014-09-20 DIAGNOSIS — Z791 Long term (current) use of non-steroidal anti-inflammatories (NSAID): Secondary | ICD-10-CM | POA: Diagnosis not present

## 2014-09-20 DIAGNOSIS — Z79899 Other long term (current) drug therapy: Secondary | ICD-10-CM | POA: Insufficient documentation

## 2014-09-20 DIAGNOSIS — Z7951 Long term (current) use of inhaled steroids: Secondary | ICD-10-CM | POA: Diagnosis not present

## 2014-09-20 DIAGNOSIS — Z792 Long term (current) use of antibiotics: Secondary | ICD-10-CM | POA: Diagnosis not present

## 2014-09-20 DIAGNOSIS — I10 Essential (primary) hypertension: Secondary | ICD-10-CM | POA: Insufficient documentation

## 2014-09-20 LAB — BASIC METABOLIC PANEL
Anion gap: 14 (ref 5–15)
BUN: 20 mg/dL (ref 6–23)
CO2: 17 mmol/L — ABNORMAL LOW (ref 19–32)
Calcium: 9.2 mg/dL (ref 8.4–10.5)
Chloride: 107 mmol/L (ref 96–112)
Creatinine, Ser: 0.95 mg/dL (ref 0.50–1.10)
GFR calc Af Amer: 72 mL/min — ABNORMAL LOW (ref >90)
GFR calc non Af Amer: 62 mL/min — ABNORMAL LOW (ref >90)
Glucose, Bld: 231 mg/dL — ABNORMAL HIGH (ref 70–99)
Potassium: 3.6 mmol/L (ref 3.5–5.1)
Sodium: 138 mmol/L (ref 135–145)

## 2014-09-20 LAB — CBC
HCT: 35.3 % — ABNORMAL LOW (ref 36.0–46.0)
Hemoglobin: 11.5 g/dL — ABNORMAL LOW (ref 12.0–15.0)
MCH: 27.6 pg (ref 26.0–34.0)
MCHC: 32.6 g/dL (ref 30.0–36.0)
MCV: 84.9 fL (ref 78.0–100.0)
Platelets: 418 10*3/uL — ABNORMAL HIGH (ref 150–400)
RBC: 4.16 MIL/uL (ref 3.87–5.11)
RDW: 13.9 % (ref 11.5–15.5)
WBC: 6.3 10*3/uL (ref 4.0–10.5)

## 2014-09-20 LAB — CBG MONITORING, ED: Glucose-Capillary: 227 mg/dL — ABNORMAL HIGH (ref 70–99)

## 2014-09-20 LAB — TROPONIN I: Troponin I: <0.03 ng/mL (ref <0.031)

## 2014-09-20 LAB — ETHANOL: Alcohol, Ethyl (B): 62 mg/dL — ABNORMAL HIGH (ref 0–9)

## 2014-09-20 NOTE — ED Notes (Addendum)
Pt states just pta (30 mins ago) that she had an episode of blurred vision. Pt states she all of a sudden could not see straight. Pt states that she still isn't seeing quite straight. Pt is dripping with sweat. Pt also keep c/o not being able to use her phone. Pt pulled her home telephone out of her pocket book and was trying to use it.

## 2014-09-20 NOTE — ED Notes (Signed)
Discharge instructions given, pt demonstrated teach back and verbal understanding. No concerns voiced.  

## 2014-09-20 NOTE — ED Provider Notes (Signed)
CSN: [id number]     Arrival date & time 09/20/14  1909 History  This chart was scribed for Lindsey Rhineonald Garrison Michie, MD by Abel PrestoKara Demonbreun, ED Scribe. This patient was seen in room APA14/[facility]14 and the patient's care was started at 8:24 PM.    Chief Complaint  Patient presents with  . Blurred Vision      Patient is a 64 y.o. female presenting with eye problem. The history is provided by the patient. No language interpreter was used.  Eye Problem Location:  Both Onset quality:  Sudden Duration:  30 minutes Progression:  Partially resolved Chronicity:  New Associated symptoms: blurred vision   Associated symptoms: no headaches, no nausea, no numbness, no vomiting and no weakness    HPI Comments: Lindsey Melton is a 64 y.o. female with PMHx of HTN, DM, asthma, peripheral vertigo, and COPD who presents to the Emergency Department complaining of blurred vision with sudden onset 30 minutes PTA and is partially resolved. Pt states she had just finished eating at onset. Pt notes associated dizziness. Pt denies headache, vomiting, diarrhea, chest pain SOB, dysuria and weakness or numbness in LE.  Past Medical History  Diagnosis Date  . Hypertension   . Diabetes mellitus   . Asthma   . COPD (chronic obstructive pulmonary disease)   . Peripheral vertigo   . Headache    Past Surgical History  Procedure Laterality Date  . Abdominal hysterectomy     History reviewed. No pertinent family history. History  Substance Use Topics  . Smoking status: Never Smoker   . Smokeless tobacco: Not on file  . Alcohol Use: Yes     Comment: Occasional    OB History    No data available     Review of Systems  Constitutional: Positive for diaphoresis.  Eyes: Positive for blurred vision and visual disturbance.  Respiratory: Negative for shortness of breath.   Cardiovascular: Negative for chest pain.  Gastrointestinal: Negative for nausea, vomiting and diarrhea.  Genitourinary: Negative for dysuria.   Neurological: Positive for dizziness. Negative for syncope, weakness, numbness and headaches.  All other systems reviewed and are negative.     Allergies  Prozac  Home Medications   Prior to Admission medications   Medication Sig Start Date End Date Taking? Authorizing Provider  Aspirin-Salicylamide-Caffeine (BC HEADACHE PO) Take 1 Package by mouth 2 (two) times daily as needed (pain).    Historical Provider, MD  azithromycin (ZITHROMAX Z-PAK) 250 MG tablet 2 po day one, then 1 daily x 4 days Patient not taking: Reported on 08/29/2014 06/15/14   Donnetta HutchingBrian Cook, MD  benzonatate (TESSALON) 100 MG capsule Take 1 capsule (100 mg total) by mouth 3 (three) times daily as needed for cough. 08/29/14   Samuel JesterKathleen McManus, DO  busPIRone (BUSPAR) 10 MG tablet Take 10 mg by mouth 3 (three) times daily.    Historical Provider, MD  CRESTOR 20 MG tablet Take 20 mg by mouth at bedtime. 04/29/14   Historical Provider, MD  dexamethasone (DECADRON) 4 MG tablet 1 po bid with food Patient not taking: Reported on 06/15/2014 12/03/13   Ivery QualeHobson Bryant, PA-C  glipiZIDE (GLUCOTROL) 10 MG tablet Take 10 mg by mouth daily.  03/26/14   Historical Provider, MD  JANUVIA 100 MG tablet Take 100 mg by mouth daily. 06/04/14   Historical Provider, MD  meloxicam (MOBIC) 15 MG tablet Take 15 mg by mouth daily.    Historical Provider, MD  metFORMIN (GLUCOPHAGE) 1000 MG tablet Take 1,000 mg by mouth 2 (two)  times daily.    Historical Provider, MD  mometasone-formoterol (DULERA) 100-5 MCG/ACT AERO Inhale 2 puffs into the lungs 2 (two) times daily.    Historical Provider, MD  NEXIUM 40 MG capsule Take 40 mg by mouth daily. 03/19/14   Historical Provider, MD  predniSONE (DELTASONE) 20 MG tablet Take 2 tablets (40 mg total) by mouth daily. Start 08/30/2014 08/29/14   Samuel Jester, DO  quinapril-hydrochlorothiazide (ACCURETIC) 20-12.5 MG per tablet Take 1 tablet by mouth daily.    Historical Provider, MD  venlafaxine XR (EFFEXOR-XR) 75 MG 24  hr capsule Take 75 mg by mouth daily with breakfast.    Historical Provider, MD   BP 120/69 mmHg  Pulse 126  Temp(Src) 98.9 F (37.2 C)  Resp 20  Ht  (1.549 m)  Wt 160 lb (72.576 kg)  BMI 30.25 kg/m2  SpO2 96% Physical Exam CONSTITUTIONAL: Well developed/well nourished HEAD: Normocephalic/atraumatic EYES: EOMI/PERRL, no nystagmus, no visual field deficit  no ptosis ENMT: Mucous membranes moist NECK: supple no meningeal signs, no bruits CV: S1/S2 noted, no murmurs/rubs/gallops noted LUNGS: Lungs are clear to auscultation bilaterally, no apparent distress ABDOMEN: soft, nontender, no rebound or guarding GU:no cva tenderness NEURO:Awake/alert, facies symmetric, no arm or leg drift is noted Equal 5/5 strength with shoulder abduction, elbow flex/extension, wrist flex/extension in upper extremities and equal hand grips bilaterally Equal 5/5 strength with hip flexion,knee flex/extension, foot dorsi/plantar flexion Cranial nerves 3/4/5/6/01/17/09/11/12 tested and intact No past pointing Pt can ambulate without ataxia EXTREMITIES: pulses normal, full ROM SKIN: warm, color normal PSYCH: no abnormalities of mood noted   ED Course  Procedures  DIAGNOSTIC STUDIES: Oxygen Saturation is 100% on room air, normal by my interpretation.    COORDINATION OF CARE: 8:31 PM Discussed treatment plan with patient at beside, the patient agrees with the plan and has no further questions at this time.   Initial concern for possible TIA/CVA on first examination However on further discussion, pt denies any visual loss, she did not report diplopia and she reports blurred vision that has resolved. She reports her recent dizziness has been occurring intermittently and not acute.  She has no ataxia on exam.  She has no focal weakness on my evaluation.  She would like to go home and this point my suspicion for CVA/TIA is low Pt has been drinking ETOH which could have contributed to this  condition    Labs Review Labs Reviewed  CBC - Abnormal; Notable for the following:    Hemoglobin 11.5 (*)    HCT 35.3 (*)    Platelets 418 (*)    All other components within normal limits  BASIC METABOLIC PANEL - Abnormal; Notable for the following:    CO2 17 (*)    Glucose, Bld 231 (*)    GFR calc non Af Amer 62 (*)    GFR calc Af Amer 72 (*)    All other components within normal limits  ETHANOL - Abnormal; Notable for the following:    Alcohol, Ethyl (B) 62 (*)    All other components within normal limits  CBG MONITORING, ED - Abnormal; Notable for the following:    Glucose-Capillary 227 (*)    All other components within normal limits  TROPONIN I  CBG MONITORING, ED    Imaging Review Ct Head Wo Contrast  09/20/2014   CLINICAL DATA:  Acute onset of blurred vision. Dizziness. Initial encounter.  EXAM: CT HEAD WITHOUT CONTRAST  TECHNIQUE: Contiguous axial images were obtained from the base  of the skull through the vertex without intravenous contrast.  COMPARISON:  CT of the head performed 09/13/2011  FINDINGS: There is no evidence of acute infarction, mass lesion, or intra- or extra-axial hemorrhage on CT.  The posterior fossa, including the cerebellum, brainstem and fourth ventricle, is within normal limits. The third and lateral ventricles, and basal ganglia are unremarkable in appearance. The cerebral hemispheres are symmetric in appearance, with normal gray-white differentiation. No mass effect or midline shift is seen.  There is no evidence of fracture; visualized osseous structures are unremarkable in appearance. The visualized portions of the orbits are within normal limits. The paranasal sinuses and mastoid air cells are well-aerated. No significant soft tissue abnormalities are seen.  IMPRESSION: Unremarkable noncontrast CT of the head.   Electronically Signed   By: Roanna Raider M.D.   On: 09/20/2014 21:56     EKG Interpretation   Date/Time:  Friday September 20 2014  20:46:10 EST Ventricular Rate:  106 PR Interval:  145 QRS Duration: 87 QT Interval:  367 QTC Calculation: 487 R Axis:   11 Text Interpretation:  Sinus tachycardia RSR' in V1 or V2, probably normal  variant Borderline prolonged QT interval No significant change since last  tracing Confirmed by Bebe Shaggy  MD, Kattleya Kuhnert ([id number]) on 09/20/2014 8:51:21 PM      MDM   Final diagnoses:  Dehydration  Blurred vision    Nursing notes including past medical history and social history reviewed and considered in documentation Labs/vital reviewed myself and considered during evaluation   I personally performed the services described in this documentation, which was scribed in my presence. The recorded information has been reviewed and is accurate.      Lindsey Rhine, MD 09/20/14 [phone]

## 2014-09-20 NOTE — Discharge Instructions (Signed)
RETURN IMMEDIATELY IF  you develop new shortness of breath, chest pain, fever, have difficulty moving parts of your body (new weakness, numbness, or incoordination), sudden change in speech, vision, swallowing, or understanding, faint or develop new dizziness, severe headache, become poorly responsive or have an altered mental status compared to baseline for you, new rash, abdominal pain, or bloody stools,  Return sooner also if you develop new problems for which you have not talked to your caregiver but you feel may be emergency medical conditions.    Dehydration, Adult Dehydration is when you lose more fluids from the body than you take in. Vital organs like the kidneys, brain, and heart cannot function without a proper amount of fluids and salt. Any loss of fluids from the body can cause dehydration.  CAUSES   Vomiting.  Diarrhea.  Excessive sweating.  Excessive urine output.  Fever. SYMPTOMS  Mild dehydration  Thirst.  Dry lips.  Slightly dry mouth. Moderate dehydration  Very dry mouth.  Sunken eyes.  Skin does not bounce back quickly when lightly pinched and released.  Dark urine and decreased urine production.  Decreased tear production.  Headache. Severe dehydration  Very dry mouth.  Extreme thirst.  Rapid, weak pulse (more than 100 beats per minute at rest).  Cold hands and feet.  Not able to sweat in spite of heat and temperature.  Rapid breathing.  Blue lips.  Confusion and lethargy.  Difficulty being awakened.  Minimal urine production.  No tears. DIAGNOSIS  Your caregiver will diagnose dehydration based on your symptoms and your exam. Blood and urine tests will help confirm the diagnosis. The diagnostic evaluation should also identify the cause of dehydration. TREATMENT  Treatment of mild or moderate dehydration can often be done at home by increasing the amount of fluids that you drink. It is best to drink small amounts of fluid more  often. Drinking too much at one time can make vomiting worse. Refer to the home care instructions below. Severe dehydration needs to be treated at the hospital where you will probably be given intravenous (IV) fluids that contain water and electrolytes. HOME CARE INSTRUCTIONS   Ask your caregiver about specific rehydration instructions.  Drink enough fluids to keep your urine clear or pale yellow.  Drink small amounts frequently if you have nausea and vomiting.  Eat as you normally do.  Avoid:  Foods or drinks high in sugar.  Carbonated drinks.  Juice.  Extremely hot or cold fluids.  Drinks with caffeine.  Fatty, greasy foods.  Alcohol.  Tobacco.  Overeating.  Gelatin desserts.  Wash your hands well to avoid spreading bacteria and viruses.  Only take over-the-counter or prescription medicines for pain, discomfort, or fever as directed by your caregiver.  Ask your caregiver if you should continue all prescribed and over-the-counter medicines.  Keep all follow-up appointments with your caregiver. SEEK MEDICAL CARE IF:  You have abdominal pain and it increases or stays in one area (localizes).  You have a rash, stiff neck, or severe headache.  You are irritable, sleepy, or difficult to awaken.  You are weak, dizzy, or extremely thirsty. SEEK IMMEDIATE MEDICAL CARE IF:   You are unable to keep fluids down or you get worse despite treatment.  You have frequent episodes of vomiting or diarrhea.  You have blood or green matter (bile) in your vomit.  You have blood in your stool or your stool looks black and tarry.  You have not urinated in 6 to 8 hours,  or you have only urinated a small amount of very dark urine.  You have a fever.  You faint. MAKE SURE YOU:   Understand these instructions.  Will watch your condition.  Will get help right away if you are not doing well or get worse. Document Released: 06/28/2005 Document Revised: 09/20/2011 Document  Reviewed: 02/15/2011 [facility] Patient Information 2015 [place]. This information is not intended to replace advice given to you by your health care provider. Make sure you discuss any questions you have with your health care provider.

## 2014-10-15 ENCOUNTER — Encounter (HOSPITAL_COMMUNITY): Payer: Self-pay | Admitting: Emergency Medicine

## 2014-10-15 ENCOUNTER — Emergency Department (HOSPITAL_COMMUNITY)
Admission: EM | Admit: 2014-10-15 | Discharge: 2014-10-15 | Disposition: A | Discharge status: Home / Self Care | Payer: 59 | Attending: Emergency Medicine | Admitting: Emergency Medicine

## 2014-10-15 DIAGNOSIS — Z79899 Other long term (current) drug therapy: Secondary | ICD-10-CM | POA: Diagnosis not present

## 2014-10-15 DIAGNOSIS — J449 Chronic obstructive pulmonary disease, unspecified: Secondary | ICD-10-CM | POA: Diagnosis not present

## 2014-10-15 DIAGNOSIS — I10 Essential (primary) hypertension: Secondary | ICD-10-CM | POA: Insufficient documentation

## 2014-10-15 DIAGNOSIS — Z7952 Long term (current) use of systemic steroids: Secondary | ICD-10-CM | POA: Insufficient documentation

## 2014-10-15 DIAGNOSIS — E119 Type 2 diabetes mellitus without complications: Secondary | ICD-10-CM | POA: Insufficient documentation

## 2014-10-15 DIAGNOSIS — Z794 Long term (current) use of insulin: Secondary | ICD-10-CM | POA: Diagnosis not present

## 2014-10-15 DIAGNOSIS — R21 Rash and other nonspecific skin eruption: Secondary | ICD-10-CM | POA: Diagnosis present

## 2014-10-15 DIAGNOSIS — L309 Dermatitis, unspecified: Secondary | ICD-10-CM

## 2014-10-15 DIAGNOSIS — Z7951 Long term (current) use of inhaled steroids: Secondary | ICD-10-CM | POA: Diagnosis not present

## 2014-10-15 LAB — CBG MONITORING, ED: Glucose-Capillary: 63 mg/dL — ABNORMAL LOW (ref 70–99)

## 2014-10-15 MED ORDER — HYDROXYZINE HCL 25 MG PO TABS: 25.0000 mg | ORAL_TABLET | Freq: Three times a day (TID) | ORAL | Status: DC | PRN

## 2014-10-15 NOTE — Discharge Instructions (Signed)

## 2014-10-15 NOTE — ED Notes (Signed)
Patient complaining of rash with itching on bilateral arms. States "[facility] but I can't go until Monday but I can't wait that long." Redness noted to bilateral arms at triage.

## 2014-10-15 NOTE — ED Notes (Signed)
Pt states she is feeling a little shaky as if her blood sugar is dropping, CBG checked and was 63, pt provided with coke and graham crackers and peanut butter, Burgess AmorJulie Idol, PA also made aware

## 2014-10-15 NOTE — ED Notes (Signed)
Pt states that she wants to leave and need to eat something, pt stated that she is feeling better

## 2014-10-16 NOTE — ED Provider Notes (Signed)
CSN: [id number]     Arrival date & time 10/15/14  1131 History   First MD Initiated Contact with Patient 10/15/14 1226     Chief Complaint  Patient presents with  . Rash     (Consider location/radiation/quality/duration/timing/severity/associated sxs/prior Treatment) The history is provided by the patient.   Lindsey Melton is a 64 y.o. female with a medical history of DM, htn, asthma presenting with a rash on her bilateral forearms present for at least the past 2 months.  It started as a small subcutaneous swelling with skin erythema on her right dorsal forearm, first thought to be a simple lipoma until it started to spread to her distal forearm and not affecting the left forearm as well.  Has been seen by pcp and told it is not clear what is causing the rash, but is awaiting dermatology evaulation in 1 week.  Her arms are itchy, not painful.  She presents here as she "just can't wait til Tuesday" and hoped we would know the cause of this rash. She denies pain, fevers, chills, trauma or any recognized exposures to chemicals or other possible irritants. Her DM medicines have been adjusted recently, Kombiglyze is a new medicine but this switch occurred after her sx began.      Past Medical History  Diagnosis Date  . Hypertension   . Diabetes mellitus   . Asthma   . COPD (chronic obstructive pulmonary disease)   . Peripheral vertigo   . Headache    Past Surgical History  Procedure Laterality Date  . Abdominal hysterectomy     History reviewed. No pertinent family history. History  Substance Use Topics  . Smoking status: Never Smoker   . Smokeless tobacco: Not on file  . Alcohol Use: Yes     Comment: Occasional    OB History    No data available     Review of Systems  Constitutional: Negative for fever and chills.  Respiratory: Negative for shortness of breath and wheezing.   Skin: Positive for color change and rash.  Neurological: Negative for numbness.      Allergies   Prozac  Home Medications   Prior to Admission medications   Medication Sig Start Date End Date Taking? Authorizing Provider  Aspirin-Salicylamide-Caffeine (BC HEADACHE PO) Take 1 Package by mouth 2 (two) times daily as needed (pain).   Yes Historical Provider, MD  busPIRone (BUSPAR) 10 MG tablet Take 10 mg by mouth 3 (three) times daily.   Yes Historical Provider, MD  glipiZIDE (GLUCOTROL) 10 MG tablet Take 10 mg by mouth daily.  03/26/14  Yes Historical Provider, MD  insulin glargine (LANTUS) 100 UNIT/ML injection Inject 6 Units into the skin at bedtime.   Yes Historical Provider, MD  mometasone-formoterol (DULERA) 100-5 MCG/ACT AERO Inhale 2 puffs into the lungs 2 (two) times daily.   Yes Historical Provider, MD  Olmesartan Medoxomil (BENICAR PO) Take 1 tablet by mouth daily.   Yes Historical Provider, MD  OXcarbazepine (TRILEPTAL) 150 MG tablet Take 150 mg by mouth 2 (two) times daily.   Yes Historical Provider, MD  Saxagliptin-Metformin (KOMBIGLYZE XR PO) Take 1 tablet by mouth daily.   Yes Historical Provider, MD  venlafaxine XR (EFFEXOR-XR) 75 MG 24 hr capsule Take 75 mg by mouth daily with breakfast.   Yes Historical Provider, MD  azithromycin (ZITHROMAX Z-PAK) 250 MG tablet 2 po day one, then 1 daily x 4 days Patient not taking: Reported on 08/29/2014 06/15/14   Donnetta HutchingBrian Cook, MD  benzonatate (  TESSALON) 100 MG capsule Take 1 capsule (100 mg total) by mouth 3 (three) times daily as needed for cough. Patient not taking: Reported on 09/20/2014 08/29/14   Samuel Jester, DO  dexamethasone (DECADRON) 4 MG tablet 1 po bid with food Patient not taking: Reported on 06/15/2014 12/03/13   Ivery Quale, PA-C  hydrOXYzine (ATARAX/VISTARIL) 25 MG tablet Take 1 tablet (25 mg total) by mouth every 8 (eight) hours as needed for itching. 10/15/14   Burgess Amor, PA-C  predniSONE (DELTASONE) 20 MG tablet Take 2 tablets (40 mg total) by mouth daily. Start 08/30/2014 Patient not taking: Reported on 09/20/2014  08/29/14   Samuel Jester, DO   BP 146/89 mmHg  Pulse 100  Temp(Src) 98.1 F (36.7 C) (Oral)  Resp 18  Ht  (1.549 m)  Wt 160 lb (72.576 kg)  BMI 30.25 kg/m2  SpO2 100% Physical Exam  Constitutional: She appears well-developed and well-nourished. No distress.  HENT:  Head: Normocephalic.  Neck: Neck supple.  Cardiovascular: Normal rate.   Pulmonary/Chest: Effort normal. She has no wheezes.  Musculoskeletal: Normal range of motion. She exhibits no edema.  Skin: Rash noted.  Near confluent erythematous flush to bilateral dorsal forearms associated with nodularity along the length of the forearms.  Irregular borders.  No red streaking, no increased warmth, no fluctuance.  Upper arms are unaffected. Fingers less than 2 sec cap refill. Radial pulses intact.    ED Course  Procedures (including critical care time) Labs Review Labs Reviewed  CBG MONITORING, ED - Abnormal; Notable for the following:    Glucose-Capillary 63 (*)    All other components within normal limits    Imaging Review No results found.   EKG Interpretation None      MDM   Final diagnoses:  Dermatitis    Dermatitis affecting surface and subcutaneous skin of bilateral forearms only.  No evidence to suggest infection.  Suspect possible autoimmune process. Advised pt that she needs dermatology and biopsy to further define the diagnosis of this rash.   Considered oral or topical steroids, but this medicine could interfere with derm eval, possible punch biopsy whic I suspect will be done upon visit with orth.  Prescription for atarax prescribe for itch relief. Advised to continue other home medicines.  During visit patient became shaky, felt like bs dropping, checked at 63.  She has taken her regular home medicines and ate oatmeal for breakfast, is overdue getting lunch. Given snack and graham crackers with resolution of  Sx. Pt deferred second cbg check, stating she was ready to go home, knows her cbg is  good because sx resolved. Advised to eat a full lunch and keep close watch on cbg's at home, avoid skipping meals.      Burgess Amor, PA-C 10/16/14 2157  Eber Hong, MD 10/17/14 [phone]

## 2015-04-24 LAB — HM DIABETES EYE EXAM

## 2015-07-25 ENCOUNTER — Encounter: Payer: Self-pay | Admitting: Family Medicine

## 2015-07-25 ENCOUNTER — Ambulatory Visit (INDEPENDENT_AMBULATORY_CARE_PROVIDER_SITE_OTHER): Payer: BLUE CROSS/BLUE SHIELD | Admitting: Family Medicine

## 2015-07-25 VITALS — BP 136/91 | HR 92 | Temp 97.5°F | Ht 61.0 in | Wt 266.0 lb

## 2015-07-25 DIAGNOSIS — E1140 Type 2 diabetes mellitus with diabetic neuropathy, unspecified: Secondary | ICD-10-CM

## 2015-07-25 DIAGNOSIS — E119 Type 2 diabetes mellitus without complications: Secondary | ICD-10-CM | POA: Insufficient documentation

## 2015-07-25 DIAGNOSIS — E118 Type 2 diabetes mellitus with unspecified complications: Secondary | ICD-10-CM

## 2015-07-25 DIAGNOSIS — E871 Hypo-osmolality and hyponatremia: Secondary | ICD-10-CM

## 2015-07-25 DIAGNOSIS — M542 Cervicalgia: Secondary | ICD-10-CM | POA: Insufficient documentation

## 2015-07-25 DIAGNOSIS — Z794 Long term (current) use of insulin: Secondary | ICD-10-CM

## 2015-07-25 LAB — POCT GLYCOSYLATED HEMOGLOBIN (HGB A1C): Hemoglobin A1C: 6.4

## 2015-07-25 NOTE — Progress Notes (Signed)
HPI  Patient presents today to establish care.  Patient explains that she has about a 6 week history of worsening neck pain with hand numbness and loss of function. She's had a long history of chronic neck pain that has only really been mild for several years. Over the last 6 weeks or so she developed bilateral hand numbness which is worse on the left. She also then began to develop left-sided weakness. She was hospitalized 2 separate occasions and told that she had hyponatremia. She mentions SIADH  She feels that her current position is not meeting her needs so she's changing physicians.  She is using naproxen for pain  She has anxiety, treated at [facility], managed well currently  Besdies pain she has no complaints  She also notes some new leg pain with similar quality- numbness and nagging pain.  Still it is very mild compared to her neck and arms  She requests wheelchair and shower chair as she is not able to move around like usual.   She states that she has had an MRI brain recently which ruled out stroke  PMH: Smoking status noted Past medical, surgical, social, family history reviewed and updated in EMR ROS: Per HPI  Objective: BP 136/91 mmHg  Pulse 92  Temp(Src) 97.5 F (36.4 C) (Oral)  Ht 5' 1"  (1.549 m)  Wt 266 lb (120.657 kg)  BMI 50.29 kg/m2 Gen: NAD, alert, cooperative with exam HEENT: NCAT, TMs occluded by cerumen bilateral oropharynx clear CV: RRR, good S1/S2, no murmur Resp: CTABL, no wheezes, non-labored XTA:VWPV, no guarding, tender to palpationdiffusely Ext: No edema, warm Neuro: Alert and oriented, strength 3/5 on the left upper extremity, 4/5 on the right upper extremity, 4/5 and symmetric in bilateral lower extremities   Assessment and plan:  # neck pain, radiculopathy, asymmetric weakness COntinue NSAIDs for now MRI, consider referral to neuro vs orthopedic surgery 2 week follow up [facility]   # HTN Well controlled- continue current  meds  SIADH, hyponatermia From Hx Continue salt tabs Consider dc SNRI- awaiting labs  DM2 Well controlled Continue current meds   Orders Placed This Encounter  Procedures  . MR Cervical Spine Wo Contrast    Standing Status: Future     Number of Occurrences:      Standing Expiration Date: 09/21/2016    Order Specific Question:  Reason for Exam (SYMPTOM  OR DIAGNOSIS REQUIRED)    Answer:  neck pain, L>R arm weakness    Order Specific Question:  Preferred imaging location?    Answer:  [facility]    Order Specific Question:  Does the patient have a pacemaker or implanted devices?    Answer:  No    Order Specific Question:  What is the patient's sedation requirement?    Answer:  No Sedation  . CMP14+EGFR  . CBC with Differential  . TSH + free T4  . Home Health    Order Specific Question:  To provide the following care/treatments    Answer:  PT    Order Specific Question:  To provide the following care/treatments    Answer:  OT  . Face-to-face encounter (required for Medicare/Medicaid patients)    I Kenn File certify that this patient is under my care and that I, or a nurse practitioner or physician's assistant working with me, had a face-to-face encounter that meets the physician face-to-face encounter requirements with this patient on 07/25/2015. The encounter with the patient was in whole, or in part for  the following medical condition(s) which is the primary reason for home health care (List medical condition): arm and leg weakness, diabetes    Order Specific Question:  The encounter with the patient was in whole, or in part, for the following medical condition, which is the primary reason for home health care    Answer:  diabetes, arm and leg weakness    Order Specific Question:  I certify that, based on my findings, the following services are medically necessary home health services    Answer:  Physical therapy    Order Specific Question:  I certify that, based on  my findings, the following services are medically necessary home health services    Answer:  Nursing    Order Specific Question:  Reason for Medically Necessary Home Health Services    Answer:  Skilled Nursing- Skilled Assessment/Observation    Order Specific Question:  Reason for Medically Necessary Home Health Services    Answer:  Therapy- Instruction on use of Assistive Device for Ambulation on all Surfaces    Order Specific Question:  My clinical findings support the need for the above services    Answer:  Unsafe ambulation due to balance issues    Order Specific Question:  Further, I certify that my clinical findings support that this patient is homebound due to:    Answer:  Ambulates short distances less than 300 feet  . POCT glycosylated hemoglobin (Hb A1C)    Meds ordered this encounter  Medications  . topiramate (TOPAMAX) 100 MG tablet    Sig: Take 100 mg by mouth 2 (two) times daily.  Marland Kitchen glipiZIDE (GLUCOTROL XL) 5 MG 24 hr tablet    Sig: Take 5 mg by mouth daily with breakfast.  . venlafaxine (EFFEXOR) 75 MG tablet    Sig: Take 75 mg by mouth 2 (two) times daily.  Marland Kitchen lisinopril (PRINIVIL,ZESTRIL) 10 MG tablet    Sig: Take 10 mg by mouth daily.  . sodium chloride 1 g tablet    Sig: Take 1 g by mouth 3 (three) times daily.  Marland Kitchen atorvastatin (LIPITOR) 10 MG tablet    Sig: Take 10 mg by mouth daily.    Laroy Apple, MD [facility] 07/25/2015, 5:17 PM

## 2015-07-25 NOTE — Patient Instructions (Signed)
Great to meet you!  Lets follow up in 2 weeks  We will work on MRI in the meantime, if it is denied we will look for other options.   We will call with results within 1 week.

## 2015-07-26 LAB — CMP14+EGFR
ALT: 15 IU/L (ref 0–32)
AST: 16 IU/L (ref 0–40)
Albumin/Globulin Ratio: 1.7 (ref 1.1–2.5)
Albumin: 4.1 g/dL (ref 3.6–4.8)
Alkaline Phosphatase: 130 IU/L — ABNORMAL HIGH (ref 39–117)
BUN/Creatinine Ratio: 17 (ref 11–26)
BUN: 15 mg/dL (ref 8–27)
Bilirubin Total: <0.2 mg/dL (ref 0.0–1.2)
CO2: 18 mmol/L (ref 18–29)
Calcium: 9.6 mg/dL (ref 8.7–10.3)
Chloride: 110 mmol/L — ABNORMAL HIGH (ref 96–106)
Creatinine, Ser: 0.87 mg/dL (ref 0.57–1.00)
GFR calc Af Amer: 81 mL/min/{1.73_m2} (ref >59)
GFR calc non Af Amer: 71 mL/min/{1.73_m2} (ref >59)
Globulin, Total: 2.4 g/dL (ref 1.5–4.5)
Glucose: 115 mg/dL — ABNORMAL HIGH (ref 65–99)
Potassium: 4.3 mmol/L (ref 3.5–5.2)
Sodium: 146 mmol/L — ABNORMAL HIGH (ref 134–144)
Total Protein: 6.5 g/dL (ref 6.0–8.5)

## 2015-07-26 LAB — TSH+FREE T4
Free T4: 0.99 ng/dL (ref 0.82–1.77)
TSH: 1.93 u[IU]/mL (ref 0.450–4.500)

## 2015-07-26 LAB — CBC WITH DIFFERENTIAL/PLATELET
Basophils Absolute: 0 10*3/uL (ref 0.0–0.2)
Basos: 1 %
EOS (ABSOLUTE): 0.4 10*3/uL (ref 0.0–0.4)
Eos: 7 %
Hematocrit: 39.3 % (ref 34.0–46.6)
Hemoglobin: 12.9 g/dL (ref 11.1–15.9)
Immature Grans (Abs): 0 10*3/uL (ref 0.0–0.1)
Immature Granulocytes: 0 %
Lymphocytes Absolute: 1.4 10*3/uL (ref 0.7–3.1)
Lymphs: 24 %
MCH: 29.1 pg (ref 26.6–33.0)
MCHC: 32.8 g/dL (ref 31.5–35.7)
MCV: 89 fL (ref 79–97)
Monocytes Absolute: 0.5 10*3/uL (ref 0.1–0.9)
Monocytes: 9 %
Neutrophils Absolute: 3.4 10*3/uL (ref 1.4–7.0)
Neutrophils: 59 %
Platelets: 393 10*3/uL — ABNORMAL HIGH (ref 150–379)
RBC: 4.43 x10E6/uL (ref 3.77–5.28)
RDW: 14.5 % (ref 12.3–15.4)
WBC: 5.8 10*3/uL (ref 3.4–10.8)

## 2015-07-28 ENCOUNTER — Telehealth: Payer: Self-pay | Admitting: Family Medicine

## 2015-07-28 MED ORDER — TOPIRAMATE 100 MG PO TABS: 100.0000 mg | ORAL_TABLET | Freq: Two times a day (BID) | ORAL | Status: DC

## 2015-07-28 MED ORDER — ATORVASTATIN CALCIUM 10 MG PO TABS: 10.0000 mg | ORAL_TABLET | Freq: Every day | ORAL | Status: DC

## 2015-07-28 NOTE — Telephone Encounter (Signed)
Refilled Topamax Lipitor.  Do not restart Trileptal, will discuss with patient. She [facility] where Trileptal was stopped due to hyponatremia presumed to be SIADH from Trileptal use.  Initial serum sodium on that visit was 126  Murtis SinkSam Bradshaw, MD [facility] 07/28/2015, 3:01 PM

## 2015-07-28 NOTE — Telephone Encounter (Signed)
-----   Message from Angela AdamJessica C Rostosky, [place]MA sent at 07/28/2015  2:01 PM EST ----- Pt aware of labs. Pt wanting to know if she needed to start trileptal 600 mg again? States that her last doctor took her off of it and wants to know if she needs to start taking again? Pt also needs refill on topamax and [facility]itor [facility]. Please advise

## 2015-07-29 ENCOUNTER — Telehealth: Payer: Self-pay | Admitting: Family Medicine

## 2015-07-29 ENCOUNTER — Telehealth: Payer: Self-pay

## 2015-07-29 DIAGNOSIS — M6281 Muscle weakness (generalized): Secondary | ICD-10-CM | POA: Insufficient documentation

## 2015-07-29 NOTE — Telephone Encounter (Signed)
lmtcb concerning bedside commode and shower chair.

## 2015-07-29 NOTE — Telephone Encounter (Signed)
Does this require a face to face?

## 2015-07-29 NOTE — Telephone Encounter (Signed)
Written Rx for bedside commode and shower chair.   Will ask nursing to assist and inform patient.   Murtis Sink, MD [facility] 07/29/2015, 3:58 PM

## 2015-07-30 ENCOUNTER — Telehealth: Payer: Self-pay | Admitting: Family Medicine

## 2015-07-30 NOTE — Telephone Encounter (Signed)
Patients daughter aware and patient is taking topamax  bid

## 2015-07-30 NOTE — Telephone Encounter (Signed)
Patients daughter aware that rx is ready to be picked up.

## 2015-07-30 NOTE — Telephone Encounter (Signed)
Shanda Bumps gave script to pt to pick up at store.

## 2015-08-04 ENCOUNTER — Telehealth: Payer: Self-pay | Admitting: Family Medicine

## 2015-08-04 DIAGNOSIS — M542 Cervicalgia: Secondary | ICD-10-CM

## 2015-08-04 NOTE — Telephone Encounter (Signed)
Her MRI has been denied by insurance, I will go ahead and refer her to orthopedic surgery as I think that she needs aggressive treatment.  Ambulatory physical therapy being arranged.  Murtis Sink, MD [facility] 08/04/2015, 5:34 PM

## 2015-08-04 NOTE — Telephone Encounter (Signed)
Aware,MRI was denied by insurance and a referral was done for  Orthopedics.

## 2015-08-08 ENCOUNTER — Encounter: Payer: Self-pay | Admitting: Family Medicine

## 2015-08-08 ENCOUNTER — Ambulatory Visit (INDEPENDENT_AMBULATORY_CARE_PROVIDER_SITE_OTHER): Payer: BLUE CROSS/BLUE SHIELD | Admitting: Family Medicine

## 2015-08-08 VITALS — BP 124/88 | HR 88 | Temp 97.1°F

## 2015-08-08 DIAGNOSIS — E871 Hypo-osmolality and hyponatremia: Secondary | ICD-10-CM

## 2015-08-08 DIAGNOSIS — M79606 Pain in leg, unspecified: Secondary | ICD-10-CM | POA: Diagnosis not present

## 2015-08-08 DIAGNOSIS — R209 Unspecified disturbances of skin sensation: Secondary | ICD-10-CM

## 2015-08-08 DIAGNOSIS — R6889 Other general symptoms and signs: Secondary | ICD-10-CM | POA: Diagnosis not present

## 2015-08-08 MED ORDER — PREGABALIN 75 MG PO CAPS: 75.0000 mg | ORAL_CAPSULE | Freq: Two times a day (BID) | ORAL | Status: DC

## 2015-08-08 MED ORDER — FLUCONAZOLE 150 MG PO TABS: 150.0000 mg | ORAL_TABLET | Freq: Once | ORAL | Status: DC

## 2015-08-08 NOTE — Patient Instructions (Addendum)
Great to see you!  Take the diflucan for itching  Start lyrica for pain  Follow up in 1 month

## 2015-08-08 NOTE — Progress Notes (Signed)
   HPI  Patient presents today here for follow-up for neck pain, leg pain, cold extremities, and perennial itching.  Continued L sided neck pain with BL arm weakness Her MRI was denied by insurance, her weakness has not changed.  She has an appt with orthopedics on Monday  She describes 1 day of BL leg pain and coolness She denies any injury She denies serious back pain Described as constant leg pain Not helped by current meds HAnds BL area also cool and seem to be blue-er than usual  Described perineal itching and foul odro, no rash Mild vaginal DC  PMH: Smoking status noted ROS: Per HPI  Objective: BP 124/88 mmHg  Pulse 88  Temp(Src) 97.1 F (36.2 C) (Oral)  Ht   Wt  Gen: NAD, alert, cooperative with exam HEENT: NCAT CV: RRR, good S1/S2, no murmur Resp: CTABL, no wheezes, non-labored Abd: SNTND, BS present, no guarding or organomegaly Ext: No edema, Cool to the touch BL UE and LE, 2+ DP pulses and 3 second cap refill, blueish discoloration which slowly improved throughout exam.  Neuro: Alert and oriented, No gross deficits  Assessment and plan:  # neck pain Neck pain concerning with arm weakness, seeing orthopedics for their opinion Monday  # Leg pain Unclear, especially with coolnees of hands and feet but clearly adequate circulation Start lyrica Monitor closely Rheumatoid labs  # Perineal itching Likely yeast/candidal infection Diflucan  F/u 1 month  Orders Placed This Encounter  Procedures  . BMP8+EGFR  . Sedimentation rate  . C-reactive protein  . ANA  . Rheumatoid factor    Meds ordered this encounter  Medications  . HydrOXYzine Pamoate (VISTARIL PO)    Sig: Take by mouth.  . pregabalin (LYRICA) 75 MG capsule    Sig: Take 1 capsule (75 mg total) by mouth 2 (two) times daily.    Dispense:  60 capsule    Refill:  1  . fluconazole (DIFLUCAN) 150 MG tablet    Sig: Take 1 tablet (150 mg total) by mouth once. Repeat 3 days later    Dispense:   2 tablet    Refill:  Perry, MD [facility] 08/08/2015, 1:30 PM

## 2015-08-09 LAB — BMP8+EGFR
BUN/Creatinine Ratio: 20 (ref 11–26)
BUN: 18 mg/dL (ref 8–27)
CO2: 20 mmol/L (ref 18–29)
Calcium: 10.6 mg/dL — ABNORMAL HIGH (ref 8.7–10.3)
Chloride: 106 mmol/L (ref 96–106)
Creatinine, Ser: 0.89 mg/dL (ref 0.57–1.00)
GFR calc Af Amer: 79 mL/min/{1.73_m2} (ref >59)
GFR calc non Af Amer: 69 mL/min/{1.73_m2} (ref >59)
Glucose: 179 mg/dL — ABNORMAL HIGH (ref 65–99)
Potassium: 4.6 mmol/L (ref 3.5–5.2)
Sodium: 145 mmol/L — ABNORMAL HIGH (ref 134–144)

## 2015-08-09 LAB — C-REACTIVE PROTEIN: CRP: 1.6 mg/L (ref 0.0–4.9)

## 2015-08-09 LAB — SEDIMENTATION RATE: Sed Rate: 2 mm/hr (ref 0–40)

## 2015-08-09 LAB — RHEUMATOID FACTOR: Rhuematoid fact SerPl-aCnc: <10 IU/mL (ref 0.0–13.9)

## 2015-08-09 LAB — ANA: Anti Nuclear Antibody(ANA): POSITIVE — AB

## 2015-08-14 ENCOUNTER — Telehealth: Payer: Self-pay | Admitting: Family Medicine

## 2015-08-14 MED ORDER — NYSTATIN 100000 UNIT/GM EX CREA: 1.0000 "application " | TOPICAL_CREAM | Freq: Two times a day (BID) | CUTANEOUS | Status: DC

## 2015-08-14 NOTE — Telephone Encounter (Signed)
Likely external yeast- will send nystatin  Murtis Sink, MD [facility] 08/14/2015, 5:36 PM

## 2015-08-15 NOTE — Telephone Encounter (Signed)
Spoke to [employer] [facility]) and she states the itching has resolved the problem is the odor and she's not sure the nystatin is going to work. Would you like for the pt to be seen?

## 2015-08-15 NOTE — Telephone Encounter (Signed)
Spoke to

## 2015-08-18 ENCOUNTER — Encounter: Payer: Self-pay | Admitting: Family Medicine

## 2015-08-18 ENCOUNTER — Ambulatory Visit (INDEPENDENT_AMBULATORY_CARE_PROVIDER_SITE_OTHER): Payer: BLUE CROSS/BLUE SHIELD | Admitting: Family Medicine

## 2015-08-18 VITALS — BP 106/70 | HR 76 | Temp 97.3°F

## 2015-08-18 DIAGNOSIS — N9489 Other specified conditions associated with female genital organs and menstrual cycle: Secondary | ICD-10-CM

## 2015-08-18 DIAGNOSIS — L298 Other pruritus: Secondary | ICD-10-CM | POA: Diagnosis not present

## 2015-08-18 DIAGNOSIS — N390 Urinary tract infection, site not specified: Secondary | ICD-10-CM | POA: Insufficient documentation

## 2015-08-18 DIAGNOSIS — N898 Other specified noninflammatory disorders of vagina: Secondary | ICD-10-CM

## 2015-08-18 DIAGNOSIS — N3000 Acute cystitis without hematuria: Secondary | ICD-10-CM | POA: Diagnosis not present

## 2015-08-18 LAB — POCT UA - MICROSCOPIC ONLY
Casts, Ur, LPF, POC: NEGATIVE
Crystals, Ur, HPF, POC: NEGATIVE
Mucus, UA: NEGATIVE
Yeast, UA: NEGATIVE

## 2015-08-18 LAB — POCT URINALYSIS DIPSTICK
Bilirubin, UA: NEGATIVE
Blood, UA: NEGATIVE
Glucose, UA: NEGATIVE
Ketones, UA: NEGATIVE
Nitrite, UA: NEGATIVE
Spec Grav, UA: 1.025
Urobilinogen, UA: NEGATIVE
pH, UA: 5

## 2015-08-18 LAB — POCT WET PREP (WET MOUNT)

## 2015-08-18 MED ORDER — FLUCONAZOLE 150 MG PO TABS: 150.0000 mg | ORAL_TABLET | Freq: Once | ORAL | Status: DC

## 2015-08-18 MED ORDER — CEPHALEXIN 500 MG PO CAPS: 500.0000 mg | ORAL_CAPSULE | Freq: Three times a day (TID) | ORAL | Status: DC

## 2015-08-18 NOTE — Progress Notes (Signed)
   HPI  Patient presents today today with vaginal itching and her.  Patient was seen last week and treated for yeast infection. She states that she's had some improvement but not complete resolution. She complains of continued vaginal itching, discharge which she does not characterize, and abnormal odor.  Since our last visit she has been seen by orthopedic surgery ordering a stat cervical MRI diagnosing myelopathy and offering surgical intervention. The patient is considering this.  She also complains of cloudy urine for a few days. She also has urethral discomfort, no overt dysuria.   PMH: Smoking status noted ROS: Per HPI  Objective: BP 106/70 mmHg  Pulse 76  Temp(Src) 97.3 F (36.3 C) (Oral)  Ht   Wt  Gen: NAD, alert, cooperative with exam HEENT: NCAT CV: RRR, good S1/S2, no murmur Resp: CTABL, no wheezes, non-labored Ext: No edema, warm Neuro: Alert and oriented, No gross deficits  Assessment and plan:  # UTI Treat with Keflex Culture Return to clinic if worsening or does not improve as expected Also sent Diflucan for possible recurrence of vaginal candidiasis   # Cervical myelopathy She's had surgical intervention recommended/offered by spine surgery Unsure at this point, she is planning to follow-up with a neurologist - the referral is in.  Discuss this possibility, I personally am leaning more towards intervention rather than waiting considering her sharp decreased from previous functional status I appreciate orthopedic surgery, spine surgery's quick treatment and evaluation.   Murtis Sink, MD [facility] 08/18/2015, 8:29 AM

## 2015-08-18 NOTE — Patient Instructions (Signed)
Great to see you!  Come back in 1 month, Please let us know if you have any concerns   Urinary Tract Infection Urinary tract infections (UTIs) can develop anywhere along your urinary tract. Your urinary tract is your body's drainage system for removing wastes and extra water. Your urinary tract includes two kidneys, two ureters, a bladder, and a urethra. Your kidneys are a pair of bean-shaped organs. Each kidney is about the size of your fist. They are located below your ribs, one on each side of your spine. CAUSES Infections are caused by microbes, which are microscopic organisms, including fungi, viruses, and bacteria. These organisms are so small that they can only be seen through a microscope. Bacteria are the microbes that most commonly cause UTIs. SYMPTOMS  Symptoms of UTIs may vary by age and gender of the patient and by the location of the infection. Symptoms in young women typically include a frequent and intense urge to urinate and a painful, burning feeling in the bladder or urethra during urination. Older women and men are more likely to be tired, shaky, and weak and have muscle aches and abdominal pain. A fever may mean the infection is in your kidneys. Other symptoms of a kidney infection include pain in your back or sides below the ribs, nausea, and vomiting. DIAGNOSIS To diagnose a UTI, your caregiver will ask you about your symptoms. Your caregiver will also ask you to provide a urine sample. The urine sample will be tested for bacteria and white blood cells. White blood cells are made by your body to help fight infection. TREATMENT  Typically, UTIs can be treated with medication. Because most UTIs are caused by a bacterial infection, they usually can be treated with the use of antibiotics. The choice of antibiotic and length of treatment depend on your symptoms and the type of bacteria causing your infection. HOME CARE INSTRUCTIONS  If you were prescribed antibiotics, take them  exactly as your caregiver instructs you. Finish the medication even if you feel better after you have only taken some of the medication.  Drink enough water and fluids to keep your urine clear or pale yellow.  Avoid caffeine, tea, and carbonated beverages. They tend to irritate your bladder.  Empty your bladder often. Avoid holding urine for long periods of time.  Empty your bladder before and after sexual intercourse.  After a bowel movement, women should cleanse from front to back. Use each tissue only once. SEEK MEDICAL CARE IF:   You have back pain.  You develop a fever.  Your symptoms do not begin to resolve within 3 days. SEEK IMMEDIATE MEDICAL CARE IF:   You have severe back pain or lower abdominal pain.  You develop chills.  You have nausea or vomiting.  You have continued burning or discomfort with urination. MAKE SURE YOU:   Understand these instructions.  Will watch your condition.  Will get help right away if you are not doing well or get worse.   This information is not intended to replace advice given to you by your health care provider. Make sure you discuss any questions you have with your health care provider.   Document Released: 04/07/2005 Document Revised: 03/19/2015 Document Reviewed: 08/06/2011 Elsevier Interactive Patient Education [employer]

## 2015-08-20 ENCOUNTER — Encounter: Payer: Self-pay | Admitting: Neurology

## 2015-08-20 ENCOUNTER — Ambulatory Visit (INDEPENDENT_AMBULATORY_CARE_PROVIDER_SITE_OTHER): Payer: BLUE CROSS/BLUE SHIELD | Admitting: Neurology

## 2015-08-20 VITALS — BP 105/72 | HR 94 | Ht 61.0 in | Wt 138.0 lb

## 2015-08-20 DIAGNOSIS — R413 Other amnesia: Secondary | ICD-10-CM | POA: Diagnosis not present

## 2015-08-20 DIAGNOSIS — F1910 Other psychoactive substance abuse, uncomplicated: Secondary | ICD-10-CM

## 2015-08-20 DIAGNOSIS — M4712 Other spondylosis with myelopathy, cervical region: Secondary | ICD-10-CM | POA: Diagnosis not present

## 2015-08-20 DIAGNOSIS — E538 Deficiency of other specified B group vitamins: Secondary | ICD-10-CM

## 2015-08-20 DIAGNOSIS — R410 Disorientation, unspecified: Secondary | ICD-10-CM

## 2015-08-20 DIAGNOSIS — F05 Delirium due to known physiological condition: Secondary | ICD-10-CM | POA: Diagnosis not present

## 2015-08-20 NOTE — Patient Instructions (Signed)
Remember to drink plenty of fluid, eat healthy meals and do not skip any meals. Try to eat protein with a every meal and eat a healthy snack such as fruit or nuts in between meals. Try to keep a regular sleep-wake schedule and try to exercise daily, particularly in the form of walking, 20-30 minutes a day, if you can.   I would like to see you back after surgery, sooner if we need to. Please call us with any interim questions, concerns, problems, updates or refill requests.   Our phone number is [phone]. We also have an after hours call service for urgent matters and there is a physician on-call for urgent questions. For any emergencies you know to call 911 or go to the nearest emergency room

## 2015-08-20 NOTE — Progress Notes (Signed)
[facility]    Provider:  Dr Lucia Gaskins Referring Provider: Venita Lick Primary Care Physician:  Kevin Fenton, MD  CC:  Cervical stenosis and myelopathy  HPI:  Lindsey Melton is a 65 y.o. female here as a referral from Dr. Debria Garret brooks for cervical stenosis and myelopathy. Past medical history of hypertension, diabetes, asthma, COPD, headache, anxiety, depression, hyperper lipidemia, very recent polysubstance abuse including alcohol and crack cocaine stopped 2 months ago. Patient is here with daughter provides most information. Having neck pain for 3-4 years and weakness in the legs and shocks of electricity down the spine. Last April she went from a cane to walker. She was having progressive neck pain, weakness, gait difficulty and hospitalized at [facility] and at that point she had not had a cervical MRI. She has radiating paresthesias into the arms. She has numbness and tingling in her hands. She has left greater than right-sided weakness. Lower extremity numbness and weakness as well. She had a brain MRI but no neck MRI. She was referred for physical therapy. Daughter switched docs and went to Dr. Ermalinda Memos in [place] and she was sent to [facility] orthopaedics.  They had the MRi of the cervical spine completed. Showed a cord compression. She is having some memory issues and memory loss that started since current symptoms started taking place 3-4 years ago. Memory changes worsening with stress and anxiety. Patient has a history of alcohol abuse and substance abuse and she stopped using 2 months ago.  Symptoms are progressive. No changes in bowel or bladder but she has had 2 incontinent episodes overnight in the last few months. Daughter took over care in December. Difficulty to walk, bowel movements normal, no urinary retension. Sleeping ok. Alcohol and crack cocaine use as recently as 2 months ago. She was drinking beer she doesn't remember how much, patient is a poor historian.  The last 3 years she has been drinking only until her money runs out. No trauma or inciting events, slowly progressive symptoms and neck pain.  Reviewed notes, labs and imaging from outside physicians, which showed: Patient and daughter bring an MRI of the cervical spine on CD. Personally reviewed images. At C3-C4 there appears to be facet arthrosis, mild anterolisthesis, redundant ligamentum flavum and small cystic focus ventral to the left ligamentum flavum resulting in severe central canal stenosis with moderate market mass effect on the cord, left worse than right, and associated cord edema. There is also severe right neuroforaminal stenosis. At C4-C5 there appears to be facet arthrosis and anterolisthesis with uncovertebral arthrosis resulting in moderate canal stenosis and severe bilateral neuroforaminal stenosis. At C5 and C6 facet arthrosis and mild anterolisthesis resulting in moderate central canal stenosis, mild mass effect on the cord and moderate left neuroforaminal stenosis. C6-C7 mild disc bulging and uncovertebral arthrosis resulting in severe bilateral neural foraminal stenosis and mild central cord stenosis. C7-T1 mild disc bulge and uncovertebral arthrosis resulting in severe left and moderate right neural foraminal stenosis. C2-C3 severe left neural foraminal stenosis due to facet arthrosis.  Reviewed [facility] notes. MRI of the brain completed in December 2016 was unremarkable per report but I do not have the images to review. Per report, showed small vessel disease and mild atrophy.  tsh normal hgba1c 6.4  Review of Systems: Patient complains of symptoms per HPI as well as the following symptoms: Shortness of breath, wheezing, snoring, easy bruising, feeling hot, feeling cold, increased thirst, hearing loss, memory loss, confusion, numbness, weakness, dizziness, sleepiness, snoring, depression, anxiety,  change in appetite, disinterest in activities. Pertinent negatives  per HPI. All others negative.   Social History   Social History  . Marital Status: Widowed    Spouse Name: N/A  . Number of Children: 2  . Years of Education: 12   Occupational History  . Not on file.   Social History Main Topics  . Smoking status: Never Smoker   . Smokeless tobacco: Not on file  . Alcohol Use: No     Comment: Occasional   . Drug Use: No     Comment: Past   . Sexual Activity: No   Other Topics Concern  . Not on file   Social History Narrative   Lives with daughter   Caffeine use: 1 cup coffee per day   Tea rare    Family History  Problem Relation Age of Onset  . Arthritis Mother   . Diabetes Mother   . Hearing loss Mother   . Heart disease Mother   . Hypertension Mother   . Vision loss Mother   . Stroke Mother   . Asthma Mother   . Heart disease Father   . Depression Daughter   . Mental illness Daughter   . Vision loss Daughter   . Anxiety disorder Daughter   . Alcohol abuse Son   . Drug abuse Son   . Anxiety disorder Son     Past Medical History  Diagnosis Date  . Hypertension   . Diabetes mellitus   . Asthma   . COPD (chronic obstructive pulmonary disease) (HCC)   . Peripheral vertigo   . Headache   . Anxiety   . Arthritis   . Depression   . Hyperlipidemia   . Neuromuscular disorder (HCC)   . Substance abuse     Past Surgical History  Procedure Laterality Date  . Abdominal hysterectomy      Current Outpatient Prescriptions  Medication Sig Dispense Refill  . Aspirin-Salicylamide-Caffeine (BC HEADACHE PO) Take 1 Package by mouth 2 (two) times daily as needed (pain).    Marland Kitchen atorvastatin (LIPITOR) 10 MG tablet Take 1 tablet (10 mg total) by mouth daily. 90 tablet 3  . busPIRone (BUSPAR) 10 MG tablet Take 30 mg by mouth 3 (three) times daily.     . cephALEXin (KEFLEX) 500 MG capsule Take 1 capsule (500 mg total) by mouth 3 (three) times daily. 21 capsule 0  . fluconazole (DIFLUCAN) 150 MG tablet Take 1 tablet (150 mg total) by  mouth once. Repeat 3 days later 2 tablet 0  . glipiZIDE (GLUCOTROL XL) 5 MG 24 hr tablet Take 5 mg by mouth daily with breakfast.    . HydrOXYzine Pamoate (VISTARIL PO) Take by mouth.    . insulin glargine (LANTUS) 100 UNIT/ML injection Inject 15 Units into the skin at bedtime.     Marland Kitchen lisinopril (PRINIVIL,ZESTRIL) 10 MG tablet Take 10 mg by mouth daily.    . sodium chloride 1 g tablet Take 1 g by mouth daily.    Marland Kitchen topiramate (TOPAMAX) 100 MG tablet Take 1 tablet (100 mg total) by mouth 2 (two) times daily. 180 tablet 3  . venlafaxine (EFFEXOR) 75 MG tablet Take 75 mg by mouth 2 (two) times daily.    . pregabalin (LYRICA) 75 MG capsule Take 1 capsule (75 mg total) by mouth 2 (two) times daily. (Patient not taking: Reported on 08/20/2015) 60 capsule 1   No current facility-administered medications for this visit.    Allergies as of 08/20/2015 -  Review Complete 08/20/2015  Allergen Reaction Noted  . Prozac [fluoxetine hcl] Itching and Other (See Comments) 09/13/2011    Vitals: BP 105/72 mmHg  Pulse 94  Ht 5\' 1"  (1.549 m)  Wt 138 lb (62.596 kg)  BMI 26.09 kg/m2 Last Weight:  Wt Readings from Last 1 Encounters:  08/20/15 138 lb (62.596 kg)   Last Height:   Ht Readings from Last 1 Encounters:  08/20/15 5\' 1"  (1.549 m)    Physical exam: Exam: Gen: NAD,                      CV: RRR, no MRG. No Carotid Bruits. No peripheral edema, warm, nontender Eyes: Conjunctivae clear without exudates or hemorrhage  Neuro: Detailed Neurologic Exam  Speech:    Speech is normal; fluent and spontaneous with normal comprehension.  Cognition: Patient has difficulty with simple commands.    The patient is oriented to person.    recent and remote memory impaiured;     language fluent;     Impaired attention, concentration,     fund of knowledge impaired Cranial Nerves:    Left pupil asymmetry left >> right but reactive. Attempted funduscopic exam could not visualize due to small pupils. Visual  fields are full to finger confrontation. Extraocular movements are intact. Trigeminal sensation is intact and the muscles of mastication are normal. The face is symmetric. The palate elevates in the midline. Hearing intact. Voice is normal. Shoulder shrug is normal. The tongue has normal motion without fasciculations.   Coordination:    No apparent dysmetria apparent but difficult due to weakness  Gait:    Attempted, can bear weight but cant walk independently  Motor Observation:    No asymmetry, no atrophy, and no involuntary movements noted. Tone:    Increased tone bilat UE    Posture:erect in wheelchair       Strength:  Left arm 4/5 throughout Right arm deltoid 4/5, intact biceps, mild weaknes triceps. Right wrist/finger extension 4/5 bilat hip flexion weakness Right biceps fem 3+/5 Otherwise strength is V/V in the upper and lower limbs.      Sensation: dysesthesia and hyperesthesia.     Reflex Exam: + Hoffman right  DTR's:    Deep tendon reflexes in the upper and lower extremities are brisk bilaterally.   Toes:    The toes are upgoing bilaterally.   Clonus:    Clonus right ankle jerk.       Assessment/Plan:  65 year old female here with her daughter with 3-4 years of progressive gait ataxia, weakness with MRI of the cervical cord with multilevel degenerative changes most significant for C3-C4 severe central canal stenosis with moderate market mass effect on the cord, left worse than right, and associated cord edema.  She has hyperrflexia, upgong toes, +hoffmans, clonus and inability to ambulate independently.  Will request medical records including MRI of the brain for review. By report was unremarkable. Needs follow up with Dr. Shon Baton for discussion of decompression surgery for severe spinal stenosis at a high level of the cervical cord which is the likely cause of her symptoms. Daughter and patient plan on proceeding with e surgery with Dr. Shon Baton. After surgery  recommend rehab with Dr. Wynn Banker and Riley Kill at [facility], inpatient if possible would be best Reviewed images with patient and her daughter today. Need to evaluate memory loss more closely. Labs today B12, hiv, rpr  Naomie Dean, MD  [facility] [address] [place], [place]  [address]  Phone [phone] Fax [phone]

## 2015-08-21 ENCOUNTER — Telehealth: Payer: Self-pay | Admitting: *Deleted

## 2015-08-21 NOTE — Telephone Encounter (Signed)
Release faxed to Dr Ermalinda Memos at [facility] requesting office note, labs, xray on 08/21/15.

## 2015-08-21 NOTE — Telephone Encounter (Signed)
Receive note from [facility] notes on [facility] desk.

## 2015-08-21 NOTE — Telephone Encounter (Signed)
Release faxed to [facility] requesting all medical records and  Imaging.

## 2015-08-24 ENCOUNTER — Telehealth: Payer: Self-pay | Admitting: Neurology

## 2015-08-24 DIAGNOSIS — F1910 Other psychoactive substance abuse, uncomplicated: Secondary | ICD-10-CM | POA: Insufficient documentation

## 2015-08-24 DIAGNOSIS — M4712 Other spondylosis with myelopathy, cervical region: Secondary | ICD-10-CM | POA: Insufficient documentation

## 2015-08-24 NOTE — Telephone Encounter (Signed)
Kara Mead, would you call patient's daughter and let her know I reviewed her records and there are a few more labs I think she should have because of the complaints of memory loss. We can can discuss her memory loss in more detail at appointment follow up after her surgery. I have placed the labs if you could let them know how to get them done. Thanks.

## 2015-08-25 NOTE — Telephone Encounter (Signed)
LVM for daughter Gavin Pound) per Dr Lucia Gaskins request. Relayed message below and advised on lab hours and when they can come. Gave [facility] phone number if they have further questions.

## 2015-08-25 NOTE — Telephone Encounter (Signed)
LVM for/ Lindsey Melton to have Deborah/pt call office back. Gave [facility] phone number.

## 2015-08-27 ENCOUNTER — Telehealth: Payer: Self-pay | Admitting: *Deleted

## 2015-08-27 NOTE — Telephone Encounter (Signed)
Patient needs an appointment for surgical clearance.  Dr. Ermalinda Memos prefers to have a visit with patient to discuss up coming surgery.

## 2015-08-28 NOTE — Telephone Encounter (Signed)
Called Gavin Pound again. Spoke to her. Ok per [employer]. She stated she can try and come Monday with mother to get lab work done. Gave her lab hours. She cannot come tomorrow. Told her to call if she has any further questions.

## 2015-08-29 ENCOUNTER — Ambulatory Visit: Payer: Self-pay | Admitting: Physician Assistant

## 2015-09-02 ENCOUNTER — Ambulatory Visit (INDEPENDENT_AMBULATORY_CARE_PROVIDER_SITE_OTHER): Payer: BLUE CROSS/BLUE SHIELD | Admitting: Family Medicine

## 2015-09-02 ENCOUNTER — Ambulatory Visit (INDEPENDENT_AMBULATORY_CARE_PROVIDER_SITE_OTHER): Payer: BLUE CROSS/BLUE SHIELD

## 2015-09-02 ENCOUNTER — Encounter: Payer: Self-pay | Admitting: Family Medicine

## 2015-09-02 VITALS — BP 92/69 | HR 92 | Temp 96.9°F | Ht 61.0 in

## 2015-09-02 DIAGNOSIS — Z01818 Encounter for other preprocedural examination: Secondary | ICD-10-CM

## 2015-09-02 NOTE — Patient Instructions (Addendum)
Great to see you!  Come back in mid-April  For follow up for diabetes, you are welcome any time to come in when you need to.

## 2015-09-02 NOTE — Progress Notes (Signed)
   HPI  Patient presents today here to discuss preoperative clearance.  Patient has cervical myelopathy and is scheduled to get a cervical spine decompression and fusion to help with her pain and debility. She has no history of heart attacks, she has no orthopnea, chest pain, shortness of breath. She has no palpitations or racing heart.  She has no complaints besides the pain complaints that we've been working through.  He does have COPD, however it's well-controlled and she has no complains about today. We have discussed that this does put her at increased risk with general anesthesia, however she wishes to proceed with the procedure.  Her activities have been very limited for the last several months due to her progressive debility  PMH: Smoking status noted ROS: Per HPI  Objective: BP 92/69 mmHg  Pulse 92  Temp(Src) 96.9 F (36.1 C) (Oral)  Ht  (1.549 m)  Wt  Gen: NAD, alert, cooperative with exam HEENT: NCAT, MMM CV: RRR, good S1/S2, no murmur Resp: CTABL, no wheezes, non-labored Abd: SNTND, BS present, no guarding or organomegaly Ext: No edema, warm Neuro: Alert and oriented, No gross deficits  Chest x-ray: No acute findings, normal cardiac shadow  EKG: NSR, left atrial enlargement   Assessment and plan:  # pre- Operative clearance. She is at average to increase risk for the surgery, however given her pain and debility she would like to proceed regardless. Her EKG is normal, her chest x-ray is also normal. She will proceed with surgery.  Her recent blood pressures have been low-normal. Sh ehas been on lisinopril for a long time. For now I wll continue, recommended monitoring at home.    Orders Placed This Encounter  Procedures  . DG Chest 2 View    Standing Status: Future     Number of Occurrences: 1     Standing Expiration Date: 11/01/2016    Order Specific Question:  Reason for Exam (SYMPTOM  OR DIAGNOSIS REQUIRED)    Answer:  pre op clearance    Order  Specific Question:  Preferred imaging location?    Answer:  Internal  . EKG 12-Lead    Murtis Sink, MD [facility] 09/02/2015, 11:18 AM

## 2015-09-03 ENCOUNTER — Encounter (HOSPITAL_COMMUNITY)
Admission: RE | Admit: 2015-09-03 | Discharge: 2015-09-03 | Disposition: A | Discharge status: Home / Self Care | Payer: BLUE CROSS/BLUE SHIELD | Source: Ambulatory Visit | Attending: Orthopedic Surgery | Admitting: Orthopedic Surgery

## 2015-09-03 ENCOUNTER — Encounter (HOSPITAL_COMMUNITY): Payer: Self-pay

## 2015-09-03 DIAGNOSIS — Z7982 Long term (current) use of aspirin: Secondary | ICD-10-CM | POA: Diagnosis not present

## 2015-09-03 DIAGNOSIS — G709 Myoneural disorder, unspecified: Secondary | ICD-10-CM | POA: Diagnosis not present

## 2015-09-03 DIAGNOSIS — E119 Type 2 diabetes mellitus without complications: Secondary | ICD-10-CM | POA: Insufficient documentation

## 2015-09-03 DIAGNOSIS — J449 Chronic obstructive pulmonary disease, unspecified: Secondary | ICD-10-CM | POA: Insufficient documentation

## 2015-09-03 DIAGNOSIS — E785 Hyperlipidemia, unspecified: Secondary | ICD-10-CM | POA: Diagnosis not present

## 2015-09-03 DIAGNOSIS — Z794 Long term (current) use of insulin: Secondary | ICD-10-CM | POA: Insufficient documentation

## 2015-09-03 DIAGNOSIS — F419 Anxiety disorder, unspecified: Secondary | ICD-10-CM | POA: Insufficient documentation

## 2015-09-03 DIAGNOSIS — G959 Disease of spinal cord, unspecified: Secondary | ICD-10-CM | POA: Insufficient documentation

## 2015-09-03 DIAGNOSIS — Z01812 Encounter for preprocedural laboratory examination: Secondary | ICD-10-CM | POA: Insufficient documentation

## 2015-09-03 DIAGNOSIS — Z79899 Other long term (current) drug therapy: Secondary | ICD-10-CM | POA: Diagnosis not present

## 2015-09-03 DIAGNOSIS — J45909 Unspecified asthma, uncomplicated: Secondary | ICD-10-CM | POA: Insufficient documentation

## 2015-09-03 DIAGNOSIS — Z0183 Encounter for blood typing: Secondary | ICD-10-CM | POA: Insufficient documentation

## 2015-09-03 DIAGNOSIS — Z01818 Encounter for other preprocedural examination: Secondary | ICD-10-CM | POA: Diagnosis not present

## 2015-09-03 DIAGNOSIS — F329 Major depressive disorder, single episode, unspecified: Secondary | ICD-10-CM | POA: Insufficient documentation

## 2015-09-03 DIAGNOSIS — I10 Essential (primary) hypertension: Secondary | ICD-10-CM | POA: Diagnosis not present

## 2015-09-03 HISTORY — DX: Calculus of kidney: N20.0

## 2015-09-03 HISTORY — DX: Presence of spectacles and contact lenses: Z97.3

## 2015-09-03 HISTORY — DX: Personal history of other diseases of the digestive system: Z87.19

## 2015-09-03 HISTORY — DX: Unspecified cataract: H26.9

## 2015-09-03 HISTORY — DX: Disease of spinal cord, unspecified: G95.9

## 2015-09-03 LAB — COMPREHENSIVE METABOLIC PANEL
ALT: 22 U/L (ref 14–54)
AST: 18 U/L (ref 15–41)
Albumin: 4 g/dL (ref 3.5–5.0)
Alkaline Phosphatase: 122 U/L (ref 38–126)
Anion gap: 12 (ref 5–15)
BUN: 22 mg/dL — ABNORMAL HIGH (ref 6–20)
CO2: 19 mmol/L — ABNORMAL LOW (ref 22–32)
Calcium: 10.3 mg/dL (ref 8.9–10.3)
Chloride: 110 mmol/L (ref 101–111)
Creatinine, Ser: 0.98 mg/dL (ref 0.44–1.00)
GFR calc Af Amer: >60 mL/min (ref >60)
GFR calc non Af Amer: 60 mL/min — ABNORMAL LOW (ref >60)
Glucose, Bld: 120 mg/dL — ABNORMAL HIGH (ref 65–99)
Potassium: 3.8 mmol/L (ref 3.5–5.1)
Sodium: 141 mmol/L (ref 135–145)
Total Bilirubin: 0.6 mg/dL (ref 0.3–1.2)
Total Protein: 6.7 g/dL (ref 6.5–8.1)

## 2015-09-03 LAB — TYPE AND SCREEN
ABO/RH(D): O POS
Antibody Screen: NEGATIVE

## 2015-09-03 LAB — CBC
HCT: 40.8 % (ref 36.0–46.0)
Hemoglobin: 13.8 g/dL (ref 12.0–15.0)
MCH: 28.8 pg (ref 26.0–34.0)
MCHC: 33.8 g/dL (ref 30.0–36.0)
MCV: 85 fL (ref 78.0–100.0)
Platelets: 389 10*3/uL (ref 150–400)
RBC: 4.8 MIL/uL (ref 3.87–5.11)
RDW: 12.5 % (ref 11.5–15.5)
WBC: 6.7 10*3/uL (ref 4.0–10.5)

## 2015-09-03 LAB — ABO/RH: ABO/RH(D): O POS

## 2015-09-03 LAB — SURGICAL PCR SCREEN
MRSA, PCR: NEGATIVE
Staphylococcus aureus: NEGATIVE

## 2015-09-03 LAB — GLUCOSE, CAPILLARY: Glucose-Capillary: 107 mg/dL — ABNORMAL HIGH (ref 65–99)

## 2015-09-03 NOTE — Progress Notes (Signed)
Pt is accompanied by her daughter, Gavin Pound to PAT visit. Pt denies any acute cardiopulmonary issues. Pt is a poor historian so, daughter would assist pt with medical history. Pt stated that she last used crack cocaine and drank " a couple of beers" in December but could not indicated if it was the beginning or end of the month. Pt stated that her fasting blood sugars are usually in the " low 100's." Anesthesia made aware of order for pre-op consult ( see note). Revonda Standard, PA, Anesthesia, advised that a CBC and CMET be drawn. Both pt and daughter verbalized understanding of all pre-op instructions.

## 2015-09-03 NOTE — Pre-Procedure Instructions (Signed)
Lindsey Melton  09/03/2015      [facility] - [place], [place] - [address] [place] [address] Phone: [phone] Fax: [phone]  [facility] - [place], [place] - [address]  [place] [address] Phone: [phone] Fax: [phone]    Your procedure is scheduled on Wednesday. September 10, 2015  Report to [facility] Admitting at 9:30 A.M.  Call this number if you have problems the morning of surgery:  [phone]   Remember:  Do not eat food or drink liquids after midnight Tuesday, September 09, 2015  Take these medicines the morning of surgery with A SIP OF WATER : busPIRone (BUSPAR),  HydrOXYzine Pamoate (VISTARIL),  omeprazole (PRILOSEC ),  pregabalin (LYRICA),  topiramate (TOPAMAX),  venlafaxine (EFFEXOR),  fluticasone-salmeterol (ADVAIR ),  if needed: albuterol (PROVENTIL HFA;VENTOLIN HFA)  Inhaler for wheezing or shortness of breath ( bring inhaler in with you on day of surgery).  Stop taking Aspirin, fish oil, vitamins, and herbal medications. Do not take any NSAIDs ie: Ibuprofen, Advil, Naproxen or any medication containing Aspirin; stop Thursday, September 04, 2015.  How to Manage Your Diabetes Before Surgery  Why is it important to control my blood sugar before and after surgery?   Improving blood sugar levels before and after surgery helps healing and can limit problems.  A way of improving blood sugar control is eating a healthy diet by:  - Eating less sugar and carbohydrates  - Increasing activity/exercise  - Talk with your doctor about reaching your blood sugar goals  High blood sugars (greater than 180 mg/dL) can raise your risk of infections and slow down your recovery so you will need to focus on controlling your diabetes during the weeks before surgery.  Make sure that the doctor who takes care of your diabetes knows about your planned surgery including the date and  location.  How do I manage my blood sugars before surgery?   Check your blood sugar at least 4 times a day, 2 days before surgery to make sure that they are not too high or low.   Check your blood sugar the morning of your surgery when you wake up and every 2 hours until you get to the Short-Stay unit.  If your blood sugar is less than 70 mg/dL, you will need to treat for low blood sugar by:  Treat a low blood sugar (less than 70 mg/dL) with 1/2 cup of clear juice (cranberry or apple), 4 glucose tablets, OR glucose gel.  Recheck blood sugar in 15 minutes after treatment (to make sure it is greater than 70 mg/dL).  If blood sugar is not greater than 70 mg/dL on re-check, call [phone] for further instructions.   Report your blood sugar to the Short-Stay nurse when you get to Short-Stay.  References:  [facility], 2007 "How to Manage your Diabetes Before and After Surgery".  What do I do about my diabetes medications?   THE NIGHT BEFORE SURGERY, take 12 units of Lantus Insulin.   Do not wear jewelry, make-up or nail polish.  Do not wear lotions, powders, or perfumes.  You may not wear deodorant.  Do not shave 48 hours prior to surgery.    Do not bring valuables to the hospital.  [facility] is not responsible for any belongings or valuables.  Contacts, dentures or bridgework may not be worn into surgery.  Leave your suitcase in the  car.  After surgery it may be brought to your room.  For patients admitted to the hospital, discharge time will be determined by your treatment team.  Patients discharged the day of surgery will not be allowed to drive home.   Name and phone number of your driver:   Special instructions: Shower the night before surgery and the morning of surgery with CHG.  Please read over the following fact sheets that you were given. Pain Booklet, Coughing and Deep Breathing, MRSA Information and Surgical Site Infection  Prevention

## 2015-09-03 NOTE — Progress Notes (Signed)
Anesthesia Note: Patient is a 65 year old female posted for C3-4 anterior, posterior C3-4 decompression and fusion on 09/10/15 by Dr. Shon Baton.   Patient was recently referred to neurologist Dr. Lucia Gaskins who 08/20/15 note states, "65 year old female here with her daughter with 3-4 years of progressive gait ataxia, weakness with MRI of the cervical cord with multilevel degenerative changes most significant for C3-C4 severe central canal stenosis with moderate market mass effect on the cord, left worse than right, and associated cord edema. She has hyperrflexia, upgong toes, +hoffmans, clonus and inability to ambulate independently.Marland KitchenMarland KitchenNeeds follow up with Dr. Shon Baton for discussion of decompression surgery for severe spinal stenosis at a high level of the cervical cord which is the likely cause of her symptoms. Daughter and patient plan on proceeding with e surgery with Dr. Shon Baton. After surgery recommend rehab with Dr. Wynn Banker and Riley Kill at [facility], inpatient if possible would be best."  Other history includes HTN, DM2, asthma, COPD, non-smoker, peripheral vertigo, anxiety, depression, HLD, substance abuse (cocaine, ETOH; stopped 06/2015), neuromuscular disorder, arthritis, headaches, hysterectomy.   Patient was seen by Dr. Monica Martinez [facility]) on 09/02/15 for preoperative surgical clearance. He writes: # pre- Operative clearance. She is at average to increase risk for the surgery, however given her pain and debility she would like to proceed regardless. Her EKG is normal, her chest x-ray is also normal. She will proceed with surgery.  Meds include albuterol, ASA , Lipitor, buspirone, Advair, Lantus, lisinopril, fish oil, Lyrica, sodium chloride, Topamax, Effexor.  PAT Vitals: BP 101/68, HR 92, RR 20, O2 sat 98%, T 37C. CBG 107.  09/02/15 EKG: SR, RSR (V1), non-diagnostic, low voltage with rightward P-axis and rotation, possibly pulmonary disease.  09/02/15 CXR: IMPRESSION: There is  no active cardiopulmonary disease.   Preoperative labs noted.  Patient has been medically cleared. She is refraining from ETOH and cocaine for the past several months. If no acute changes then I anticipate that she can proceed as planned.  Velna Ochs [facility] Short Stay Center/Anesthesiology Phone [phone] 09/03/2015 3:28 PM

## 2015-09-10 ENCOUNTER — Inpatient Hospital Stay (HOSPITAL_COMMUNITY): Payer: BLUE CROSS/BLUE SHIELD

## 2015-09-10 ENCOUNTER — Encounter (HOSPITAL_COMMUNITY)
Admission: RE | Disposition: A | Discharge status: Skilled Nursing Facility | Payer: Self-pay | Source: Ambulatory Visit | Attending: Pulmonary Disease

## 2015-09-10 ENCOUNTER — Encounter (HOSPITAL_COMMUNITY): Payer: Self-pay | Admitting: Anesthesiology

## 2015-09-10 ENCOUNTER — Inpatient Hospital Stay (HOSPITAL_COMMUNITY): Payer: BLUE CROSS/BLUE SHIELD | Admitting: Vascular Surgery

## 2015-09-10 ENCOUNTER — Inpatient Hospital Stay (HOSPITAL_COMMUNITY): Payer: BLUE CROSS/BLUE SHIELD | Admitting: Anesthesiology

## 2015-09-10 ENCOUNTER — Inpatient Hospital Stay (HOSPITAL_COMMUNITY)
Admission: RE | Admit: 2015-09-10 | Discharge: 2015-09-18 | DRG: 472 | Disposition: A | Discharge status: Skilled Nursing Facility | Payer: BLUE CROSS/BLUE SHIELD | Source: Ambulatory Visit | Attending: Internal Medicine | Admitting: Internal Medicine

## 2015-09-10 DIAGNOSIS — J9601 Acute respiratory failure with hypoxia: Secondary | ICD-10-CM

## 2015-09-10 DIAGNOSIS — R1310 Dysphagia, unspecified: Secondary | ICD-10-CM | POA: Insufficient documentation

## 2015-09-10 DIAGNOSIS — M5001 Cervical disc disorder with myelopathy,  high cervical region: Principal | ICD-10-CM | POA: Diagnosis present

## 2015-09-10 DIAGNOSIS — Z419 Encounter for procedure for purposes other than remedying health state, unspecified: Secondary | ICD-10-CM

## 2015-09-10 DIAGNOSIS — M4802 Spinal stenosis, cervical region: Secondary | ICD-10-CM | POA: Diagnosis present

## 2015-09-10 DIAGNOSIS — F418 Other specified anxiety disorders: Secondary | ICD-10-CM | POA: Diagnosis present

## 2015-09-10 DIAGNOSIS — E1142 Type 2 diabetes mellitus with diabetic polyneuropathy: Secondary | ICD-10-CM | POA: Diagnosis present

## 2015-09-10 DIAGNOSIS — R21 Rash and other nonspecific skin eruption: Secondary | ICD-10-CM | POA: Diagnosis not present

## 2015-09-10 DIAGNOSIS — I9581 Postprocedural hypotension: Secondary | ICD-10-CM | POA: Diagnosis not present

## 2015-09-10 DIAGNOSIS — M4712 Other spondylosis with myelopathy, cervical region: Secondary | ICD-10-CM | POA: Diagnosis present

## 2015-09-10 DIAGNOSIS — J45909 Unspecified asthma, uncomplicated: Secondary | ICD-10-CM | POA: Diagnosis present

## 2015-09-10 DIAGNOSIS — Z8249 Family history of ischemic heart disease and other diseases of the circulatory system: Secondary | ICD-10-CM | POA: Diagnosis not present

## 2015-09-10 DIAGNOSIS — Z7982 Long term (current) use of aspirin: Secondary | ICD-10-CM | POA: Diagnosis not present

## 2015-09-10 DIAGNOSIS — Z87891 Personal history of nicotine dependence: Secondary | ICD-10-CM | POA: Diagnosis not present

## 2015-09-10 DIAGNOSIS — Z931 Gastrostomy status: Secondary | ICD-10-CM

## 2015-09-10 DIAGNOSIS — Z9911 Dependence on respirator [ventilator] status: Secondary | ICD-10-CM

## 2015-09-10 DIAGNOSIS — G959 Disease of spinal cord, unspecified: Secondary | ICD-10-CM | POA: Diagnosis present

## 2015-09-10 DIAGNOSIS — J9690 Respiratory failure, unspecified, unspecified whether with hypoxia or hypercapnia: Secondary | ICD-10-CM

## 2015-09-10 DIAGNOSIS — Z888 Allergy status to other drugs, medicaments and biological substances status: Secondary | ICD-10-CM

## 2015-09-10 DIAGNOSIS — E1165 Type 2 diabetes mellitus with hyperglycemia: Secondary | ICD-10-CM | POA: Diagnosis present

## 2015-09-10 DIAGNOSIS — R1313 Dysphagia, pharyngeal phase: Secondary | ICD-10-CM | POA: Diagnosis not present

## 2015-09-10 DIAGNOSIS — Z981 Arthrodesis status: Secondary | ICD-10-CM | POA: Diagnosis not present

## 2015-09-10 DIAGNOSIS — Z79899 Other long term (current) drug therapy: Secondary | ICD-10-CM

## 2015-09-10 DIAGNOSIS — K5900 Constipation, unspecified: Secondary | ICD-10-CM | POA: Diagnosis not present

## 2015-09-10 DIAGNOSIS — J384 Edema of larynx: Secondary | ICD-10-CM | POA: Diagnosis not present

## 2015-09-10 DIAGNOSIS — I10 Essential (primary) hypertension: Secondary | ICD-10-CM | POA: Diagnosis present

## 2015-09-10 DIAGNOSIS — Z794 Long term (current) use of insulin: Secondary | ICD-10-CM | POA: Diagnosis not present

## 2015-09-10 DIAGNOSIS — E785 Hyperlipidemia, unspecified: Secondary | ICD-10-CM | POA: Diagnosis present

## 2015-09-10 DIAGNOSIS — Z993 Dependence on wheelchair: Secondary | ICD-10-CM | POA: Diagnosis not present

## 2015-09-10 DIAGNOSIS — D62 Acute posthemorrhagic anemia: Secondary | ICD-10-CM | POA: Insufficient documentation

## 2015-09-10 DIAGNOSIS — T380X5A Adverse effect of glucocorticoids and synthetic analogues, initial encounter: Secondary | ICD-10-CM | POA: Diagnosis present

## 2015-09-10 DIAGNOSIS — J449 Chronic obstructive pulmonary disease, unspecified: Secondary | ICD-10-CM | POA: Insufficient documentation

## 2015-09-10 DIAGNOSIS — E119 Type 2 diabetes mellitus without complications: Secondary | ICD-10-CM | POA: Insufficient documentation

## 2015-09-10 DIAGNOSIS — G8918 Other acute postprocedural pain: Secondary | ICD-10-CM | POA: Insufficient documentation

## 2015-09-10 DIAGNOSIS — M5412 Radiculopathy, cervical region: Secondary | ICD-10-CM | POA: Diagnosis present

## 2015-09-10 DIAGNOSIS — J9600 Acute respiratory failure, unspecified whether with hypoxia or hypercapnia: Secondary | ICD-10-CM | POA: Diagnosis not present

## 2015-09-10 DIAGNOSIS — J988 Other specified respiratory disorders: Secondary | ICD-10-CM | POA: Diagnosis not present

## 2015-09-10 HISTORY — PX: ANTERIOR CERVICAL DECOMP/DISCECTOMY FUSION: SHX1161

## 2015-09-10 HISTORY — PX: POSTERIOR CERVICAL FUSION/FORAMINOTOMY: SHX5038

## 2015-09-10 LAB — COMPREHENSIVE METABOLIC PANEL
ALT: 14 U/L (ref 14–54)
AST: 23 U/L (ref 15–41)
Albumin: 3.3 g/dL — ABNORMAL LOW (ref 3.5–5.0)
Alkaline Phosphatase: 104 U/L (ref 38–126)
Anion gap: 13 (ref 5–15)
BUN: 24 mg/dL — ABNORMAL HIGH (ref 6–20)
CO2: 21 mmol/L — ABNORMAL LOW (ref 22–32)
Calcium: 9.3 mg/dL (ref 8.9–10.3)
Chloride: 109 mmol/L (ref 101–111)
Creatinine, Ser: 1.17 mg/dL — ABNORMAL HIGH (ref 0.44–1.00)
GFR calc Af Amer: 56 mL/min — ABNORMAL LOW (ref >60)
GFR calc non Af Amer: 48 mL/min — ABNORMAL LOW (ref >60)
Glucose, Bld: 196 mg/dL — ABNORMAL HIGH (ref 65–99)
Potassium: 4.4 mmol/L (ref 3.5–5.1)
Sodium: 143 mmol/L (ref 135–145)
Total Bilirubin: 0.4 mg/dL (ref 0.3–1.2)
Total Protein: 5.5 g/dL — ABNORMAL LOW (ref 6.5–8.1)

## 2015-09-10 LAB — TRIGLYCERIDES: Triglycerides: 189 mg/dL — ABNORMAL HIGH (ref <150)

## 2015-09-10 LAB — CBC
HCT: 35.5 % — ABNORMAL LOW (ref 36.0–46.0)
Hemoglobin: 11.4 g/dL — ABNORMAL LOW (ref 12.0–15.0)
MCH: 28.3 pg (ref 26.0–34.0)
MCHC: 32.1 g/dL (ref 30.0–36.0)
MCV: 88.1 fL (ref 78.0–100.0)
Platelets: 268 10*3/uL (ref 150–400)
RBC: 4.03 MIL/uL (ref 3.87–5.11)
RDW: 12.5 % (ref 11.5–15.5)
WBC: 9.3 10*3/uL (ref 4.0–10.5)

## 2015-09-10 LAB — GLUCOSE, CAPILLARY
Glucose-Capillary: 111 mg/dL — ABNORMAL HIGH (ref 65–99)
Glucose-Capillary: 84 mg/dL (ref 65–99)
Glucose-Capillary: 83 mg/dL (ref 65–99)
Glucose-Capillary: 175 mg/dL — ABNORMAL HIGH (ref 65–99)

## 2015-09-10 SURGERY — ANTERIOR CERVICAL DECOMPRESSION/DISCECTOMY FUSION 1 LEVEL
Anesthesia: General

## 2015-09-10 MED ORDER — SUCCINYLCHOLINE CHLORIDE 20 MG/ML IJ SOLN
INTRAMUSCULAR | Status: DC | PRN
  Administered 2015-09-10: 100 mg via INTRAVENOUS

## 2015-09-10 MED ORDER — ONDANSETRON HCL 4 MG/2ML IJ SOLN
4.0000 mg | INTRAMUSCULAR | Status: DC | PRN
  Administered 2015-09-14: 4 mg via INTRAVENOUS
  Filled 2015-09-10: qty 2

## 2015-09-10 MED ORDER — TOPIRAMATE 100 MG PO TABS: 100.0000 mg | ORAL_TABLET | Freq: Two times a day (BID) | ORAL | Status: DC

## 2015-09-10 MED ORDER — BUPIVACAINE-EPINEPHRINE 0.25% -1:200000 IJ SOLN
INTRAMUSCULAR | Status: DC | PRN
  Administered 2015-09-10 (×2): 7 mL

## 2015-09-10 MED ORDER — OXYCODONE HCL 5 MG PO TABS: 10.0000 mg | ORAL_TABLET | ORAL | Status: DC | PRN

## 2015-09-10 MED ORDER — SODIUM CHLORIDE 0.9% FLUSH: 3.0000 mL | INTRAVENOUS | Status: DC | PRN

## 2015-09-10 MED ORDER — HEMOSTATIC AGENTS (NO CHARGE) OPTIME
TOPICAL | Status: DC | PRN
  Administered 2015-09-10: 1 via TOPICAL

## 2015-09-10 MED ORDER — ONDANSETRON HCL 4 MG/2ML IJ SOLN
INTRAMUSCULAR | Status: DC | PRN
  Administered 2015-09-10: 4 mg via INTRAVENOUS

## 2015-09-10 MED ORDER — GLYCOPYRROLATE 0.2 MG/ML IJ SOLN
INTRAMUSCULAR | Status: DC | PRN
  Administered 2015-09-10: .2 mg via INTRAVENOUS
  Administered 2015-09-10: .4 mg via INTRAVENOUS

## 2015-09-10 MED ORDER — FENTANYL CITRATE (PF) 100 MCG/2ML IJ SOLN
INTRAMUSCULAR | Status: DC | PRN
  Administered 2015-09-10: 50 ug via INTRAVENOUS
  Administered 2015-09-10: 100 ug via INTRAVENOUS
  Administered 2015-09-10: 50 ug via INTRAVENOUS
  Administered 2015-09-10 (×2): 100 ug via INTRAVENOUS
  Administered 2015-09-10 (×2): 50 ug via INTRAVENOUS

## 2015-09-10 MED ORDER — FENTANYL CITRATE (PF) 100 MCG/2ML IJ SOLN
100.0000 ug | INTRAMUSCULAR | Status: DC | PRN
  Administered 2015-09-12: 100 ug via INTRAVENOUS
  Filled 2015-09-10: qty 2

## 2015-09-10 MED ORDER — ALBUTEROL SULFATE (2.5 MG/3ML) 0.083% IN NEBU: 2.5000 mg | INHALATION_SOLUTION | RESPIRATORY_TRACT | Status: DC | PRN

## 2015-09-10 MED ORDER — HYDROMORPHONE HCL 1 MG/ML IJ SOLN
INTRAMUSCULAR | Status: AC
  Filled 2015-09-10: qty 1

## 2015-09-10 MED ORDER — PROPOFOL 1000 MG/100ML IV EMUL
0.0000 ug/kg/min | INTRAVENOUS | Status: DC
  Administered 2015-09-11 (×4): 40 ug/kg/min via INTRAVENOUS
  Administered 2015-09-12: 45 ug/kg/min via INTRAVENOUS
  Filled 2015-09-10 (×7): qty 100

## 2015-09-10 MED ORDER — 0.9 % SODIUM CHLORIDE (POUR BTL) OPTIME
TOPICAL | Status: DC | PRN
  Administered 2015-09-10 (×2): 1000 mL

## 2015-09-10 MED ORDER — DEXAMETHASONE SODIUM PHOSPHATE 4 MG/ML IJ SOLN
4.0000 mg | Freq: Four times a day (QID) | INTRAMUSCULAR | Status: DC
  Administered 2015-09-10 – 2015-09-13 (×11): 4 mg via INTRAVENOUS
  Filled 2015-09-10 (×13): qty 1

## 2015-09-10 MED ORDER — LACTATED RINGERS IV SOLN
INTRAVENOUS | Status: DC
  Administered 2015-09-10 (×4): via INTRAVENOUS

## 2015-09-10 MED ORDER — MIDAZOLAM HCL 2 MG/2ML IJ SOLN
INTRAMUSCULAR | Status: AC
  Filled 2015-09-10: qty 2

## 2015-09-10 MED ORDER — FENTANYL CITRATE (PF) 100 MCG/2ML IJ SOLN: 100.0000 ug | INTRAMUSCULAR | Status: DC | PRN

## 2015-09-10 MED ORDER — INSULIN ASPART 100 UNIT/ML ~~LOC~~ SOLN
0.0000 [IU] | SUBCUTANEOUS | Status: DC
  Administered 2015-09-10: 3 [IU] via SUBCUTANEOUS
  Administered 2015-09-11: 1 [IU] via SUBCUTANEOUS
  Administered 2015-09-11 (×2): 3 [IU] via SUBCUTANEOUS
  Administered 2015-09-11: 2 [IU] via SUBCUTANEOUS
  Administered 2015-09-11: 3 [IU] via SUBCUTANEOUS
  Administered 2015-09-11: 5 [IU] via SUBCUTANEOUS
  Administered 2015-09-12 (×2): 3 [IU] via SUBCUTANEOUS
  Administered 2015-09-12: 2 [IU] via SUBCUTANEOUS
  Administered 2015-09-12: 3 [IU] via SUBCUTANEOUS
  Administered 2015-09-12 – 2015-09-14 (×4): 2 [IU] via SUBCUTANEOUS
  Administered 2015-09-14: 3 [IU] via SUBCUTANEOUS
  Administered 2015-09-15: 2 [IU] via SUBCUTANEOUS
  Administered 2015-09-16: 3 [IU] via SUBCUTANEOUS
  Administered 2015-09-16 (×2): 2 [IU] via SUBCUTANEOUS
  Administered 2015-09-17: 5 [IU] via SUBCUTANEOUS
  Administered 2015-09-17 – 2015-09-18 (×4): 2 [IU] via SUBCUTANEOUS
  Administered 2015-09-18: 3 [IU] via SUBCUTANEOUS
  Administered 2015-09-18: 2 [IU] via SUBCUTANEOUS

## 2015-09-10 MED ORDER — MIDAZOLAM HCL 5 MG/5ML IJ SOLN
INTRAMUSCULAR | Status: DC | PRN
  Administered 2015-09-10 (×2): 1 mg via INTRAVENOUS
  Administered 2015-09-10: 2 mg via INTRAVENOUS

## 2015-09-10 MED ORDER — BUDESONIDE 0.5 MG/2ML IN SUSP
0.5000 mg | Freq: Two times a day (BID) | RESPIRATORY_TRACT | Status: DC
  Administered 2015-09-10 – 2015-09-18 (×16): 0.5 mg via RESPIRATORY_TRACT
  Filled 2015-09-10 (×16): qty 2

## 2015-09-10 MED ORDER — PHENYLEPHRINE HCL 10 MG/ML IJ SOLN
INTRAMUSCULAR | Status: DC | PRN
  Administered 2015-09-10 (×2): 80 ug via INTRAVENOUS

## 2015-09-10 MED ORDER — FENTANYL CITRATE (PF) 250 MCG/5ML IJ SOLN
INTRAMUSCULAR | Status: AC
  Filled 2015-09-10: qty 5

## 2015-09-10 MED ORDER — CEFAZOLIN SODIUM-DEXTROSE 2-3 GM-% IV SOLR
2.0000 g | INTRAVENOUS | Status: AC
  Administered 2015-09-10 (×2): 2 g via INTRAVENOUS

## 2015-09-10 MED ORDER — PREGABALIN 25 MG PO CAPS: 75.0000 mg | ORAL_CAPSULE | Freq: Two times a day (BID) | ORAL | Status: DC

## 2015-09-10 MED ORDER — INSULIN GLARGINE 100 UNIT/ML ~~LOC~~ SOLN: 15.0000 [IU] | Freq: Every day | SUBCUTANEOUS | Status: DC

## 2015-09-10 MED ORDER — EPHEDRINE SULFATE 50 MG/ML IJ SOLN
INTRAMUSCULAR | Status: DC | PRN
  Administered 2015-09-10: 50 mg via INTRAVENOUS

## 2015-09-10 MED ORDER — DEXAMETHASONE SODIUM PHOSPHATE 4 MG/ML IJ SOLN
INTRAMUSCULAR | Status: DC | PRN
Start: 2015-09-10 — End: 2015-09-10
  Administered 2015-09-10: 4 mg via INTRAVENOUS

## 2015-09-10 MED ORDER — MENTHOL 3 MG MT LOZG
1.0000 | LOZENGE | OROMUCOSAL | Status: DC | PRN
  Filled 2015-09-10: qty 9

## 2015-09-10 MED ORDER — DEXAMETHASONE 4 MG PO TABS
4.0000 mg | ORAL_TABLET | Freq: Four times a day (QID) | ORAL | Status: DC
Start: 2015-09-10 — End: 2015-09-13
  Filled 2015-09-10 (×13): qty 1

## 2015-09-10 MED ORDER — BUPIVACAINE-EPINEPHRINE (PF) 0.25% -1:200000 IJ SOLN
INTRAMUSCULAR | Status: AC
  Filled 2015-09-10: qty 60

## 2015-09-10 MED ORDER — PROPOFOL 10 MG/ML IV BOLUS
INTRAVENOUS | Status: AC
  Filled 2015-09-10: qty 20

## 2015-09-10 MED ORDER — ARFORMOTEROL TARTRATE 15 MCG/2ML IN NEBU
15.0000 ug | INHALATION_SOLUTION | Freq: Two times a day (BID) | RESPIRATORY_TRACT | Status: DC
  Administered 2015-09-10 – 2015-09-18 (×16): 15 ug via RESPIRATORY_TRACT
  Filled 2015-09-10 (×16): qty 2

## 2015-09-10 MED ORDER — DEXTROSE 5 % IV SOLN
10.0000 mg | INTRAVENOUS | Status: DC | PRN
  Administered 2015-09-10: 50 ug/min via INTRAVENOUS

## 2015-09-10 MED ORDER — CEFAZOLIN SODIUM 1-5 GM-% IV SOLN
1.0000 g | Freq: Three times a day (TID) | INTRAVENOUS | Status: AC
Start: 2015-09-10 — End: 2015-09-11
  Administered 2015-09-10 – 2015-09-11 (×2): 1 g via INTRAVENOUS
  Filled 2015-09-10 (×2): qty 50

## 2015-09-10 MED ORDER — TOPIRAMATE 100 MG PO TABS
100.0000 mg | ORAL_TABLET | Freq: Two times a day (BID) | ORAL | Status: DC
  Administered 2015-09-11 – 2015-09-18 (×8): 100 mg
  Filled 2015-09-10 (×15): qty 1

## 2015-09-10 MED ORDER — DEXTROSE 5 % IV SOLN
500.0000 mg | Freq: Four times a day (QID) | INTRAVENOUS | Status: DC | PRN
  Filled 2015-09-10: qty 5

## 2015-09-10 MED ORDER — THROMBIN 20000 UNITS EX SOLR
CUTANEOUS | Status: AC
  Filled 2015-09-10: qty 20000

## 2015-09-10 MED ORDER — SODIUM CHLORIDE 0.9 % IV SOLN
INTRAVENOUS | Status: DC
  Administered 2015-09-10: 100 mL via INTRAVENOUS

## 2015-09-10 MED ORDER — PROPOFOL 10 MG/ML IV BOLUS
INTRAVENOUS | Status: DC | PRN
  Administered 2015-09-10: 100 mg via INTRAVENOUS
  Administered 2015-09-10: 20 mg via INTRAVENOUS

## 2015-09-10 MED ORDER — PANTOPRAZOLE SODIUM 40 MG IV SOLR
40.0000 mg | Freq: Every day | INTRAVENOUS | Status: DC
  Administered 2015-09-10 – 2015-09-12 (×3): 40 mg via INTRAVENOUS
  Filled 2015-09-10 (×3): qty 40

## 2015-09-10 MED ORDER — GLYCOPYRROLATE 0.2 MG/ML IJ SOLN
INTRAMUSCULAR | Status: AC
  Filled 2015-09-10: qty 2

## 2015-09-10 MED ORDER — ATORVASTATIN CALCIUM 10 MG PO TABS: 10.0000 mg | ORAL_TABLET | Freq: Every day | ORAL | Status: DC

## 2015-09-10 MED ORDER — SODIUM CHLORIDE 0.9 % IV SOLN: 250.0000 mL | INTRAVENOUS | Status: DC

## 2015-09-10 MED ORDER — THROMBIN 20000 UNITS EX KIT
PACK | CUTANEOUS | Status: AC
  Filled 2015-09-10: qty 1

## 2015-09-10 MED ORDER — VENLAFAXINE HCL 75 MG PO TABS: 75.0000 mg | ORAL_TABLET | Freq: Every day | ORAL | Status: DC

## 2015-09-10 MED ORDER — MORPHINE SULFATE (PF) 2 MG/ML IV SOLN: 1.0000 mg | INTRAVENOUS | Status: DC | PRN

## 2015-09-10 MED ORDER — LACTATED RINGERS IV SOLN: INTRAVENOUS | Status: DC

## 2015-09-10 MED ORDER — ROCURONIUM BROMIDE 50 MG/5ML IV SOLN
INTRAVENOUS | Status: AC
  Filled 2015-09-10: qty 1

## 2015-09-10 MED ORDER — PHENOL 1.4 % MT LIQD: 1.0000 | OROMUCOSAL | Status: DC | PRN

## 2015-09-10 MED ORDER — ONDANSETRON HCL 4 MG/2ML IJ SOLN
INTRAMUSCULAR | Status: AC
  Filled 2015-09-10: qty 2

## 2015-09-10 MED ORDER — ALBUTEROL SULFATE HFA 108 (90 BASE) MCG/ACT IN AERS: 2.0000 | INHALATION_SPRAY | RESPIRATORY_TRACT | Status: DC | PRN

## 2015-09-10 MED ORDER — MOMETASONE FURO-FORMOTEROL FUM 200-5 MCG/ACT IN AERO: 2.0000 | INHALATION_SPRAY | Freq: Two times a day (BID) | RESPIRATORY_TRACT | Status: DC

## 2015-09-10 MED ORDER — METHOCARBAMOL 500 MG PO TABS
500.0000 mg | ORAL_TABLET | Freq: Four times a day (QID) | ORAL | Status: DC | PRN
  Administered 2015-09-16: 500 mg via ORAL
  Filled 2015-09-10 (×2): qty 1

## 2015-09-10 MED ORDER — CEFAZOLIN SODIUM-DEXTROSE 2-3 GM-% IV SOLR
INTRAVENOUS | Status: AC
  Filled 2015-09-10: qty 50

## 2015-09-10 MED ORDER — THROMBIN 20000 UNITS EX KIT
PACK | CUTANEOUS | Status: DC | PRN
  Administered 2015-09-10: 20000 [IU] via TOPICAL

## 2015-09-10 MED ORDER — SODIUM CHLORIDE 0.9% FLUSH: 3.0000 mL | Freq: Two times a day (BID) | INTRAVENOUS | Status: DC

## 2015-09-10 MED ORDER — LISINOPRIL 20 MG PO TABS: 20.0000 mg | ORAL_TABLET | Freq: Every day | ORAL | Status: DC

## 2015-09-10 MED ORDER — HYDROMORPHONE HCL 1 MG/ML IJ SOLN
INTRAMUSCULAR | Status: DC | PRN
  Administered 2015-09-10: 1 mg via INTRAVENOUS

## 2015-09-10 MED ORDER — LIDOCAINE HCL (CARDIAC) 20 MG/ML IV SOLN
INTRAVENOUS | Status: DC | PRN
  Administered 2015-09-10: 100 mg via INTRAVENOUS

## 2015-09-10 MED ORDER — ATORVASTATIN CALCIUM 10 MG PO TABS
10.0000 mg | ORAL_TABLET | Freq: Every day | ORAL | Status: DC
  Administered 2015-09-11 – 2015-09-18 (×5): 10 mg
  Filled 2015-09-10 (×5): qty 1

## 2015-09-10 MED ORDER — PROPOFOL 500 MG/50ML IV EMUL
INTRAVENOUS | Status: DC | PRN
  Administered 2015-09-10: 50 ug/kg/min via INTRAVENOUS

## 2015-09-10 SURGICAL SUPPLY — 98 items
BIT DRILL MOUNTAINEER FIX 14 (BIT) ×1
BIT DRILL MOUNTAINEER FIX 14MM (BIT) ×1 IMPLANT
BLADE CLIPPER SURG (BLADE) ×2 IMPLANT
BLADE SURG ROTATE 9660 (MISCELLANEOUS) IMPLANT
CANISTER SUCTION 2500CC (MISCELLANEOUS) ×2 IMPLANT
CLSR STERI-STRIP ANTIMIC 1/2X4 (GAUZE/BANDAGES/DRESSINGS) ×4 IMPLANT
CORDS BIPOLAR (ELECTRODE) ×4 IMPLANT
COVER MAYO STAND STRL (DRAPES) ×4 IMPLANT
COVER SURGICAL LIGHT HANDLE (MISCELLANEOUS) ×4 IMPLANT
CRADLE DONUT ADULT HEAD (MISCELLANEOUS) ×2 IMPLANT
DERMABOND ADVANCED (GAUZE/BANDAGES/DRESSINGS)
DERMABOND ADVANCED .7 DNX12 (GAUZE/BANDAGES/DRESSINGS) IMPLANT
DRAPE C-ARM 42X72 X-RAY (DRAPES) ×2 IMPLANT
DRAPE C-ARMOR (DRAPES) ×2 IMPLANT
DRAPE POUCH INSTRU U-SHP 10X18 (DRAPES) ×2 IMPLANT
DRAPE SURG 17X23 STRL (DRAPES) ×2 IMPLANT
DRAPE U-SHAPE 47X51 STRL (DRAPES) ×2 IMPLANT
DRILL BIT MOUNTAINEER FIX 14MM (BIT) ×1
DRSG ADAPTIC 3X8 NADH LF (GAUZE/BANDAGES/DRESSINGS) IMPLANT
DRSG AQUACEL AG ADV 3.5X10 (GAUZE/BANDAGES/DRESSINGS) ×2 IMPLANT
DRSG MEPILEX BORDER 4X4 (GAUZE/BANDAGES/DRESSINGS) ×4 IMPLANT
DRSG MEPILEX BORDER 4X8 (GAUZE/BANDAGES/DRESSINGS) IMPLANT
DURAPREP 26ML APPLICATOR (WOUND CARE) ×2 IMPLANT
DURAPREP 6ML APPLICATOR 50/CS (WOUND CARE) ×2 IMPLANT
ELECT BLADE 4.0 EZ CLEAN MEGAD (MISCELLANEOUS)
ELECT BLADE 6.5 EXT (BLADE) ×2 IMPLANT
ELECT CAUTERY BLADE 6.4 (BLADE) ×2 IMPLANT
ELECT COATED BLADE 2.86 ST (ELECTRODE) ×2 IMPLANT
ELECT PENCIL ROCKER SW 15FT (MISCELLANEOUS) ×2 IMPLANT
ELECT REM PT RETURN 9FT ADLT (ELECTROSURGICAL) ×2
ELECTRODE BLDE 4.0 EZ CLN MEGD (MISCELLANEOUS) IMPLANT
ELECTRODE REM PT RTRN 9FT ADLT (ELECTROSURGICAL) ×1 IMPLANT
EVACUATOR 1/8 PVC DRAIN (DRAIN) IMPLANT
GLOVE BIO SURGEON STRL SZ 6.5 (GLOVE) ×4 IMPLANT
GLOVE BIOGEL PI IND STRL 6.5 (GLOVE) ×2 IMPLANT
GLOVE BIOGEL PI IND STRL 8.5 (GLOVE) ×2 IMPLANT
GLOVE BIOGEL PI INDICATOR 6.5 (GLOVE) ×2
GLOVE BIOGEL PI INDICATOR 8.5 (GLOVE) ×2
GLOVE SS BIOGEL STRL SZ 8.5 (GLOVE) ×3 IMPLANT
GLOVE SUPERSENSE BIOGEL SZ 8.5 (GLOVE) ×3
GOWN STRL REUS W/ TWL LRG LVL3 (GOWN DISPOSABLE) ×4 IMPLANT
GOWN STRL REUS W/TWL 2XL LVL3 (GOWN DISPOSABLE) ×4 IMPLANT
GOWN STRL REUS W/TWL LRG LVL3 (GOWN DISPOSABLE) ×4
IV CATH 14GX2 1/4 (CATHETERS) IMPLANT
KIT BASIN OR (CUSTOM PROCEDURE TRAY) ×2 IMPLANT
KIT POSITION SURG JACKSON T1 (MISCELLANEOUS) ×2 IMPLANT
KIT ROOM TURNOVER OR (KITS) ×2 IMPLANT
NEEDLE 22X1 1/2 (OR ONLY) (NEEDLE) ×2 IMPLANT
NEEDLE SPNL 18GX3.5 QUINCKE PK (NEEDLE) ×2 IMPLANT
NS IRRIG 1000ML POUR BTL (IV SOLUTION) ×2 IMPLANT
PACK LAMINECTOMY ORTHO (CUSTOM PROCEDURE TRAY) ×2 IMPLANT
PACK ORTHO CERVICAL (CUSTOM PROCEDURE TRAY) IMPLANT
PACK UNIVERSAL I (CUSTOM PROCEDURE TRAY) ×4 IMPLANT
PAD ARMBOARD 7.5X6 YLW CONV (MISCELLANEOUS) ×6 IMPLANT
PATTIES SURGICAL .25X.25 (GAUZE/BANDAGES/DRESSINGS) ×2 IMPLANT
PATTIES SURGICAL .5 X.5 (GAUZE/BANDAGES/DRESSINGS) IMPLANT
PATTIES SURGICAL .5 X1 (DISPOSABLE) ×2 IMPLANT
PIN RETAINER PRODISC 14 MM (PIN) ×4 IMPLANT
PUTTY BONE DBX 2.5 MIS (Bone Implant) ×2 IMPLANT
RESTRAINT LIMB HOLDER UNIV (RESTRAINTS) IMPLANT
ROD MOUNTAINEER 3.5X60 (Rod) ×2 IMPLANT
SCREW ENDO BONE 3.8X14MM (Screw) ×2 IMPLANT
SCREW F A 3.5X14 (Screw) ×4 IMPLANT
SCREW INNER (Screw) ×2 IMPLANT
SCREW LOCKING 14MMX3.5MM (Screw) ×4 IMPLANT
SCREW TCS 14X3.5MM (Screw) ×2 IMPLANT
SPACER ENDOSKELTON TCS 12X14X7 (Spacer) ×2 IMPLANT
SPONGE INTESTINAL PEANUT (DISPOSABLE) ×2 IMPLANT
SPONGE LAP 4X18 X RAY DECT (DISPOSABLE) ×4 IMPLANT
SPONGE SURGIFOAM ABS GEL 100 (HEMOSTASIS) ×2 IMPLANT
STAPLER VISISTAT 35W (STAPLE) ×2 IMPLANT
STRIP CLOSURE SKIN 1/2X4 (GAUZE/BANDAGES/DRESSINGS) IMPLANT
SURGIFLO W/THROMBIN 8M KIT (HEMOSTASIS) ×2 IMPLANT
SURGILUBE 2OZ TUBE FLIPTOP (MISCELLANEOUS) IMPLANT
SUT BONE WAX W31G (SUTURE) ×2 IMPLANT
SUT ETHILON 3 0 FSL (SUTURE) IMPLANT
SUT MON AB 3-0 SH 27 (SUTURE) ×1
SUT MON AB 3-0 SH27 (SUTURE) ×1 IMPLANT
SUT PDS AB 1 CTX 36 (SUTURE) ×2 IMPLANT
SUT PROLENE 0 CT (SUTURE) ×2 IMPLANT
SUT PROLENE 2 0 CT2 30 (SUTURE) ×2 IMPLANT
SUT PROLENE 3 0 PS 1 (SUTURE) ×2 IMPLANT
SUT STRATAFIX 1PDS 45CM VIOLET (SUTURE) ×2 IMPLANT
SUT VIC AB 0 CTB1 27 (SUTURE) ×4 IMPLANT
SUT VIC AB 1 CTX 36 (SUTURE) ×2
SUT VIC AB 1 CTX36XBRD ANBCTR (SUTURE) ×2 IMPLANT
SUT VIC AB 2-0 CT1 18 (SUTURE) ×4 IMPLANT
SYR BULB IRRIGATION 50ML (SYRINGE) ×2 IMPLANT
SYR CONTROL 10ML LL (SYRINGE) ×2 IMPLANT
TAPE CLOTH 4X10 WHT NS (GAUZE/BANDAGES/DRESSINGS) ×2 IMPLANT
TAPE UMBILICAL COTTON 1/8X30 (MISCELLANEOUS) ×2 IMPLANT
TOWEL OR 17X24 6PK STRL BLUE (TOWEL DISPOSABLE) ×2 IMPLANT
TOWEL OR 17X26 10 PK STRL BLUE (TOWEL DISPOSABLE) ×2 IMPLANT
TRAY FOLEY CATH 14FR (SET/KITS/TRAYS/PACK) ×2 IMPLANT
TRAY FOLEY CATH 16FRSI W/METER (SET/KITS/TRAYS/PACK) IMPLANT
TUBE CONNECTING 12X1/4 (SUCTIONS) ×2 IMPLANT
WATER STERILE IRR 1000ML POUR (IV SOLUTION) ×2 IMPLANT
YANKAUER SUCT BULB TIP NO VENT (SUCTIONS) ×4 IMPLANT

## 2015-09-10 NOTE — Anesthesia Preprocedure Evaluation (Addendum)
Anesthesia Evaluation  Patient identified by MRN, date of birth, ID band Patient awake    Reviewed: Allergy & Precautions, NPO status , Patient's Chart, lab work & pertinent test results  Airway Mallampati: I  TM Distance: >3 FB Neck ROM: Full    Dental   Pulmonary COPD, former smoker,    Pulmonary exam normal        Cardiovascular hypertension, Pt. on medications Normal cardiovascular exam     Neuro/Psych Anxiety Depression    GI/Hepatic   Endo/Other  diabetes, Type 2, Oral Hypoglycemic Agents  Renal/GU      Musculoskeletal   Abdominal   Peds  Hematology   Anesthesia Other Findings   Reproductive/Obstetrics                            Anesthesia Physical Anesthesia Plan  ASA: II  Anesthesia Plan: General   Post-op Pain Management:    Induction: Intravenous  Airway Management Planned: Oral ETT  Additional Equipment:   Intra-op Plan:   Post-operative Plan: Extubation in OR  Informed Consent: I have reviewed the patients History and Physical, chart, labs and discussed the procedure including the risks, benefits and alternatives for the proposed anesthesia with the patient or authorized representative who has indicated his/her understanding and acceptance.     Plan Discussed with: CRNA and Surgeon  Anesthesia Plan Comments:         Anesthesia Quick Evaluation

## 2015-09-10 NOTE — Transfer of Care (Signed)
Immediate Anesthesia Transfer of Care Note  Patient: Lindsey Melton  Procedure(s) Performed: Procedure(s): ANTERIOR C3-4 DECOMPRESSION AND FUSION (N/A) POSTERIOR CERVICAL FUSION/FORAMINOTOMY Cervical 3-4 (N/A)  Patient Location: SICU  Anesthesia Type:General  Level of Consciousness: Patient remains intubated per anesthesia plan  Airway & Oxygen Therapy: Patient remains intubated per anesthesia plan and Patient placed on Ventilator (see vital sign flow sheet for setting)  Post-op Assessment: Report given to RN and Post -op Vital signs reviewed and stable  Post vital signs: Reviewed and stable  Last Vitals:  Filed Vitals:   09/10/15 0939 09/10/15 1012  BP: 83/50 87/63  Pulse: 75   Temp: 36.9 C   Resp: 20     Complications: No apparent anesthesia complications

## 2015-09-10 NOTE — Anesthesia Procedure Notes (Signed)
Procedure Name: Intubation Date/Time: 09/10/2015 12:23 PM Performed by: Burna Cash Pre-anesthesia Checklist: Patient identified, Emergency Drugs available, Suction available and Patient being monitored Patient Re-evaluated:Patient Re-evaluated prior to inductionOxygen Delivery Method: Circle System Utilized Preoxygenation: Pre-oxygenation with 100% oxygen Intubation Type: IV induction Ventilation: Mask ventilation without difficulty Laryngoscope Size: Mac and 3 Grade View: Grade II Tube type: Oral Tube size: 7.0 mm Number of attempts: 1 Airway Equipment and Method: Stylet and Oral airway Placement Confirmation: ETT inserted through vocal cords under direct vision,  positive ETCO2 and breath sounds checked- equal and bilateral Secured at: 20 cm Tube secured with: Tape Dental Injury: Teeth and Oropharynx as per pre-operative assessment

## 2015-09-10 NOTE — Anesthesia Postprocedure Evaluation (Signed)
Anesthesia Post Note  Patient: Lindsey Melton  Procedure(s) Performed: Procedure(s) (LRB): ANTERIOR C3-4 DECOMPRESSION AND FUSION (N/A) POSTERIOR CERVICAL FUSION/FORAMINOTOMY Cervical 3-4 (N/A)  Patient location during evaluation: SICU Anesthesia Type: General Level of consciousness: sedated Pain management: pain level controlled Vital Signs Assessment: post-procedure vital signs reviewed and stable Respiratory status: patient remains intubated per anesthesia plan Cardiovascular status: stable Anesthetic complications: no    Last Vitals:  Filed Vitals:   09/10/15 1012 09/10/15 2000  BP: 87/63 97/55  Pulse:  88  Temp:    Resp:  12    Last Pain: There were no vitals filed for this visit.               Sydell Prowell,W. EDMOND

## 2015-09-10 NOTE — Consult Note (Signed)
PULMONARY / CRITICAL CARE MEDICINE   Name: Lindsey Melton MRN: [id number] DOB: [birth year]    ADMISSION DATE:  09/10/2015 CONSULTATION DATE:  09/10/15  REFERRING MD:  Shon Baton  CHIEF COMPLAINT:  VDRF post C3 - C4 fusion  HISTORY OF PRESENT ILLNESS:  Pt is encephelopathic; therefore, this HPI is obtained from chart review. Lindsey Melton is a 65 y.o. female with PMH as outlined below. She began to have neck pain with left arm paraesthesias 3 years ago and was seen by PCP as well as ortho.  She was initially started on conservative therapies but had minimal response.  Starting in December 2016, she began to experience difficulty ambulating.  She was seen in ED and had MRI done which showed atypical appearance at the C3-C4 level. She was referred to ortho and had cervical MRI done which revealed severe canal stenosis at C3 - C4 with marked anterior and posterior compression.  She was then referred to Dr. Shon Baton as well as Dr. Lucia Gaskins of neurology and after discussion with pt, decision was made to proceed with surgical intervention.  On 09/10/15, she was taken to the OR and had ACDF as well as posterior decompression and fusion of C3-C4.  Post operatively, she returned to the ICU on the vent and PCCM was called for medical management.  PAST MEDICAL HISTORY :  She  has a past medical history of Hypertension; Diabetes mellitus; Asthma; COPD (chronic obstructive pulmonary disease) (HCC); Peripheral vertigo; Headache; Anxiety; Arthritis; Depression; Hyperlipidemia; Neuromuscular disorder (HCC); Substance abuse; Wears glasses; Kidney stones; History of hiatal hernia; Cervical myelopathy (HCC); and Cataract.  PAST SURGICAL HISTORY: She  has past surgical history that includes Abdominal hysterectomy and Appendectomy.  Allergies  Allergen Reactions  . Prozac [Fluoxetine Hcl] Itching and Other (See Comments)    REACTION: Jaundice--shut down liver function    No current facility-administered medications on  file prior to encounter.   Current Outpatient Prescriptions on File Prior to Encounter  Medication Sig  . Aspirin-Salicylamide-Caffeine (BC HEADACHE PO) Take 1 Package by mouth daily as needed (pain).   Marland Kitchen atorvastatin (LIPITOR) 10 MG tablet Take 1 tablet (10 mg total) by mouth daily.  . busPIRone (BUSPAR) 10 MG tablet Take 15 mg by mouth 3 (three) times daily.   . HydrOXYzine Pamoate (VISTARIL PO) Take 25 mg by mouth 2 (two) times daily.   . insulin glargine (LANTUS) 100 UNIT/ML injection Inject 15 Units into the skin at bedtime.   Marland Kitchen lisinopril (PRINIVIL,ZESTRIL) 10 MG tablet Take 20 mg by mouth daily.   . pregabalin (LYRICA) 75 MG capsule Take 1 capsule (75 mg total) by mouth 2 (two) times daily.  . sodium chloride 1 g tablet Take 1 g by mouth daily.  Marland Kitchen topiramate (TOPAMAX) 100 MG tablet Take 1 tablet (100 mg total) by mouth 2 (two) times daily.  Marland Kitchen venlafaxine (EFFEXOR) 75 MG tablet Take 75 mg by mouth daily.     FAMILY HISTORY:  Her indicated that her mother is deceased. She indicated that her father is deceased. She indicated that her daughter is alive. She indicated that her son is alive.   SOCIAL HISTORY: She  reports that she has quit smoking. Her smoking use included Cigarettes. She has never used smokeless tobacco. She reports that she drinks alcohol. She reports that she uses illicit drugs ("Crack" cocaine).  REVIEW OF SYSTEMS:   Unable to obtain as pt is encephalopathic.  SUBJECTIVE:  On vent, non-responsive.  VITAL SIGNS: BP 97/55 mmHg  Pulse 88  Temp(Src) 98.5 F (36.9 C) (Oral)  Resp 12  Ht  (1.499 m)  Wt 62.143 kg (137 lb)  BMI 27.66 kg/m2  SpO2 99%  HEMODYNAMICS:    VENTILATOR SETTINGS: Vent Mode:  [-] SIMV;PRVC;PSV FiO2 (%):  [50 %] 50 % Set Rate:  [12 bmp] 12 bmp Vt Set:  [350 mL] 350 mL PEEP:  [5 cmH20] 5 cmH20 Plateau Pressure:  [19 cmH20] 19 cmH20  INTAKE / OUTPUT: I/O last 3 completed shifts: In: 2600 [I.V.:2600] Out: 525 [Urine:500;  Blood:25]   PHYSICAL EXAMINATION: General: Adult female, in NAD. Neuro: Sedated, does not follow commands. HEENT: [place]/AT. PERRL, sclerae anicteric.  Hard C-collar in place. Cardiovascular: RRR, no M/R/G.  Lungs: Respirations even and unlabored.  CTA bilaterally, No W/R/R.  Abdomen: BS x 4, soft, NT/ND.  Musculoskeletal: No gross deformities, no edema.  Skin: Intact, warm, no rashes.  LABS:  BMET No results for input(s): NA, K, CL, CO2, BUN, CREATININE, GLUCOSE in the last 168 hours.  Electrolytes No results for input(s): CALCIUM, MG, PHOS in the last 168 hours.  CBC No results for input(s): WBC, HGB, HCT, PLT in the last 168 hours.  Coag's No results for input(s): APTT, INR in the last 168 hours.  Sepsis Markers No results for input(s): LATICACIDVEN, PROCALCITON, O2SATVEN in the last 168 hours.  ABG No results for input(s): PHART, PCO2ART, PO2ART in the last 168 hours.  Liver Enzymes No results for input(s): AST, ALT, ALKPHOS, BILITOT, ALBUMIN in the last 168 hours.  Cardiac Enzymes No results for input(s): TROPONINI, PROBNP in the last 168 hours.  Glucose  Recent Labs Lab 09/10/15 0946 09/10/15 1142 09/10/15 1418 09/10/15 2016  GLUCAP 111* 84 83 175*    Imaging Dg Cervical Spine 2-3 Views  09/10/2015  CLINICAL DATA:  Intraoperative imaging.  C3-4 posterior fusion. EXAM: CERVICAL SPINE - 2-3 VIEW; DG C-ARM 61-120 MIN COMPARISON:  None. FINDINGS: We are provided with 2 fluoroscopic spot views of the cervical spine. Images demonstrate pedicle screws and stabilization bars in place at C3-4. The patient is status post prior anterior discectomy and fusion at this level. No acute abnormality. IMPRESSION: C3-4 posterior fusion. Electronically Signed   By: Drusilla Kanner M.D.   On: 09/10/2015 18:21   Dg C-arm 61-120 Min  09/10/2015  CLINICAL DATA:  Intraoperative imaging.  C3-4 posterior fusion. EXAM: CERVICAL SPINE - 2-3 VIEW; DG C-ARM 61-120 MIN COMPARISON:  None.  FINDINGS: We are provided with 2 fluoroscopic spot views of the cervical spine. Images demonstrate pedicle screws and stabilization bars in place at C3-4. The patient is status post prior anterior discectomy and fusion at this level. No acute abnormality. IMPRESSION: C3-4 posterior fusion. Electronically Signed   By: Drusilla Kanner M.D.   On: 09/10/2015 18:21     STUDIES:  None.  CULTURES: None.  ANTIBIOTICS: Surgical prophylaxis.  SIGNIFICANT EVENTS: 03/01 > taken to OR for ACDF with posterior decompression and fusion C3-C4.  LINES/TUBES: ETT 03/01 >   ASSESSMENT / PLAN:  PULMONARY A: VDRF - s/p ACDF with posterior decompression and fusion of C3-C4. Hx COPD by report P:   Full vent support. Wean as able. VAP prevention measures. SBT in AM if able. Albuterol PRN. Budesonide / Brovana in lieu of outpatient advair. CXR in AM.  CARDIOVASCULAR A:  Hypotension - presumed sedation related. Hx HTN, HLD P:  Monitor hemodynamics with propofol infusion. D/c outpatient lisinopril. Continue outpatient atorvastatin. Hold outpatient ASA - defer timing of resuming to ortho.  RENAL  A:   No known issues. P:   NS @ 100. CMP now - correct electrolytes as indicated. BMP in AM.  GASTROINTESTINAL A:   GI prophylaxis. Nutrition. P:   SUP: Pantoprazole. NPO.  HEMATOLOGIC A:   VTE Prophylaxis. P:  SCD's. CBC in AM.  INFECTIOUS A:   No indication of infection. P:   Monitor clinically.  ENDOCRINE A:   DM.   P:   SSI.  NEUROLOGIC A:   C3-C4 cervical myelopathy - s/p ACDF with posterior decompression and fusion of C3-C4. Hx anxiety, depression, headache, substance abuse. P:   Sedation:  Propofol gtt / Fentanyl PRN. RASS goal: 0 to -1. Daily WUA. Continue outpatient topiramate per tube. Hold outpatient buspirone, vistaril, pregabalin, venlafaxine.   Family updated: none.  Interdisciplinary Family Meeting v Palliative Care Meeting:  Due by:  03/07.  CC time: 30 minutes.   Rutherford Guys, [place] [facility] Pager: [phone]  or [phone] 09/10/2015, 8:35 PM

## 2015-09-10 NOTE — Brief Op Note (Signed)
09/10/2015  6:14 PM  PATIENT:  Lindsey Melton  65 y.o. female  PRE-OPERATIVE DIAGNOSIS:  CERVICAL MYELOPATHY  POST-OPERATIVE DIAGNOSIS:  CERVICAL MYELOPATHY  PROCEDURE:  Procedure(s): ANTERIOR C3-4 DECOMPRESSION AND FUSION (N/A) POSTERIOR CERVICAL FUSION/FORAMINOTOMY Cervical 3-4 (N/A)  SURGEON:  Surgeon(s) and Role:    * Venita Lick, MD - Primary  PHYSICIAN ASSISTANT:   ASSISTANTS: Carmen Mayo   ANESTHESIA:   general  EBL:  Total I/O In: 2600 [I.V.:2600] Out: 525 [Urine:500; Blood:25]  BLOOD ADMINISTERED:none  DRAINS: none   LOCAL MEDICATIONS USED:  MARCAINE     SPECIMEN:  No Specimen  DISPOSITION OF SPECIMEN:  N/A  COUNTS:  YES  TOURNIQUET:  * No tourniquets in log *  DICTATION: .Other Dictation: Dictation Number [id number]  PLAN OF CARE: Admit to inpatient   PATIENT DISPOSITION:  PACU - hemodynamically stable.

## 2015-09-10 NOTE — Progress Notes (Signed)
Bp 87/63 Dr Michelle Piper called and informed.

## 2015-09-10 NOTE — H&P (Signed)
History of Present Illness The patient is a 65 year old female who presents to the practice today for a transition into care. The patient is transitioning into care from another physician (Dr Sheran Luz for Neck pain) and a summary of care was reviewed.  Additional reasons for visit:  Neck pain is described as the following: The patient is seen today in referral from Dr. Ermalinda Memos [facility]). which began 3 year(s) ago. The symptoms began without any known injury (that began in 2014). Symptoms include neck pain (posteriorly. She has left arm pain and numbness and tingling in her hands bilaterally), neck stiffness, crepitus, tenderness, numbness, tingling, upper extremity weakness, weakness and headaches ("sometimes"), while symptoms do not include muscle spasm, impaired range of motion ('sometimes it is in pain") or shoulder pain. There is no radiation. The patient describes the pain as aching ("a nagging pain all the time").The patient describes their symptoms as mild (she rates her pain right now at 4-5/10).The patient does feel that the symptoms are worsening. Symptoms are not exacerbated by turning the head to the right, turning the head to the left, use of the right arm, use of the left arm, neck flexion, neck extension or neck movement. Associated symptoms include upper extremity paresthesias (left). Current treatment includes nonsteroidal anti-inflammatory drugs (Naprosyn  bid She has gotten a Rx for Lyrica from Dr Ermalinda Memos "but she has not filled it yet"). Pertinent medical history does not include neck pain, spinal surgery, low back pain, cervical disc herniation, neck injury or cervical spondylosis. Prior to being seen today the patient was previously evaluated by a colleague (Dr Ethelene Hal) 1 week(s) ago. Past evaluation has included cervical spine MRI (that was done at [facility]). The patient states that this is not a Financial risk analyst case. Note for "Neck pain": Patient  has previously seen a doctor about her spinal stenosis. He ordered PT (patient had initial evaluation plus a few visits), brain MRI, cervical spine x-rays.    Subjective Transcription Trinh is a very pleasant, once very active young woman who started having difficulty walking in December 2016. After seeing her primary care physician, she ultimately went to the emergency room because of difficulty ambulating. She was now using a cane and a walker. At the early part of December 2016, she was admitted for about two days and then ultimately discharged. Approximately a week and a half later, she was readmitted to the hospital because now she was having much more difficulty walking and she was using a walker and occasionally though wheelchair. At the time of her admission on or about the 06/30/2015, both the patient and her daughter state that she was using a walker, but was also needing a wheelchair outside of the house and was getting worse. She ultimately had an MRI done on 06/30/2015 to rule out a stroke. According to the impression, there was no evidence of an acute infarct or intracranial hemorrhage. It did note slightly atypical appearance at the C3-4 level and potentially minimal cord flattening and a cervical spine MRI was recommended for further delineation. The patient was ultimately discharged from [facility]. Probably right around the January 1, she could no longer support her own weight and she was in a wheelchair. Because of the rapid deterioration, her daughter sought a new primary care physician for evaluation. She was evaluated by Dr. Ermalinda Memos and cervical MRI was recommended. Because of insurance issues, they would not approve it and so she ultimately saw my partner Dr. Ethelene Hal who  went ahead and ordered the cervical MRI. The patient presents to me now because of the MRI findings. After discussing things with them, it is clear that at least for the last four weeks she has been non-ambulatory.  She has been confined to a wheelchair, unable to stand and support her weight, unable to walk.   Problem List/Past Medical DDD of high cervical region (M50.31)  Cervical pain (M54.2)  RUE weakness (R29.898)  Spinal stenosis of cervical region (M48.02)  Babinski reflex (R29.2)  Problems Reconciled  Recent illicit drug use (crack - 2 months ago)  Allergies  PROzac *ANTIDEPRESSANTS*   Family History Cerebrovascular Accident  Mother. Congestive Heart Failure  Father. Diabetes Mellitus  Brother, Mother. First Degree Relatives  reported Heart Disease  Mother. Heart disease in female family member before age 44  Hypertension  Mother, Sister. Osteoarthritis  Mother. Osteoporosis  Mother.  Social History  Tobacco use  Never smoker. 08/11/2015: uses less than 1/2 can(s) smokeless per week Tobacco / smoke exposure  08/11/2015: no Children  2 Current work status  unemployed Exercise  Exercises rarely; does other Former drinker  08/11/2015: In the past drank History of drug/alcohol rehab  Living situation  live with caregiver Marital status  widowed Not under pain contract  Number of flights of stairs before winded  less than 1 Previously addicted to/Dependent on drugs or pain medications   Medication History  Effexor XR (  Capsule ER 24HR, Oral) Active. (qd) Topamax (  Tablet, Oral) Active. (bid) Sodium Chloride (1GM Tablet, Oral) Active. Lyrica (  Capsule, Oral) Active. (just got RX and has not started//smt) Lisinopril (  Tablet, Oral) Active. (qd) Lantus (100UNIT/ML Solution, Subcutaneous) Active. HydrOXYzine HCl (  Tablet, Oral) Active. (1/2 tab bid prn) GlipiZIDE (  Tablet, Oral) Active. (qd) BusPIRone HCl (  Tablet, Oral) Active. (tid) Atorvastatin Calcium (  Tablet, Oral) Active. (qd) Aspirin (  Tablet DR, Oral) Active. (qd) Fish Oil (  Capsule, Oral) Active. (tid) Calcium 1000 + D (1000-800MG -UNIT  Tablet, Oral) Active. (qd) Advair HFA (Inhalation) Specific strength unknown - Active. (bid) Albuterol Sulfate (Inhalation) Specific strength unknown - Active. (qd) Naproxen DR (  Tablet DR, Oral) Active. (bid) Sodium Fluoride (1.1 (0.5 F)MG Tablet Chewable, Oral) Active. (qd) Medications Reconciled  Objective Transcription She is a pleasant woman, appears younger than her stated age. She is alert. She is oriented x3. She has hyperreflexia in the upper and lower extremities. She has a positive Babinski test, positive sustained clonus, positive Hoffmann sign bilaterally. She could not stand and support her weight due to significant proximal leg weakness. She has got global diminished strength in all muscle groups. No shortness of breath or chest pain. No loss of bowel and bladder control. Abdomen is soft and nontender. No shortness of breath or chest pain.  IMAGING Cervical MRI from 08/12/2015 was evaluated. She had severe canal stenosis at C3-4 with marked anterior and posterior compression. The posterior compression is more of a cystic type lesion. Anteriorly, it is more hard disc osteophyte. There are cord signal changes consistent with myelomalacia (cord edema). She also has kind of multilevel degenerative changes C5-6, C6-7 and to a lesser degree C4-5. Slight anterolisthesis. There is mild mass effect.     Assessment & Plan   At this point in time, both the patient and the daughter had many questions. I have tried to answer them to the best of my ability. We have had a long discussion about the underlying diagnosis of myelopathy. This tends to progress in a stepwise fashion. Patients will often  have initially subtle findings but can rapidly progress level off and then rapidly progress again. Unfortunately in this case, she is presenting to me with at least four weeks of non-ambulation. Given the fact that she has advanced neurological deficits, the likelihood that aggressive surgical  management at this point will restore her ability to walk is bare at best. It would require most likely an anterior and posterior decompression and instrumented fusion. The patient really did not want to entertain that especially since the likelihood of restoring the ability to walk is low. I did tell the family that the goal of myelopathy surgery is to prevent progression of the disease. Once the disease causes the loss of ambulation that is often used as a benchmark to not consider surgery since it is advance at that point. At this point, I think it is advisable that she see a neurologist just for further intervention, especially given her confusion and difficulty with comprehension. I do not think that is related to the myelopathy. I have given her daughter a copy of MRI report that she is essentially her primary care provider. I will see her again after she has the appointment which I will help make with a colleague of mine, Dr. Lucia Gaskins at [facility]. The patient and her daughter were present for this dictation.  Patient was evaluated by Dr Lucia Gaskins who essentially confirmed diagnosis and encouraged surgical management.  Patient returned to see me with new interest in surgical management.  I again reviewed risk/benefits and goals - especially worsening neurological function or no change to her current neurologic deficits.    Plan is for ACDF as well as posterior decompression and fusion of C3/4

## 2015-09-11 ENCOUNTER — Inpatient Hospital Stay (HOSPITAL_COMMUNITY): Payer: BLUE CROSS/BLUE SHIELD

## 2015-09-11 ENCOUNTER — Ambulatory Visit: Payer: BLUE CROSS/BLUE SHIELD | Admitting: Family Medicine

## 2015-09-11 DIAGNOSIS — Z9911 Dependence on respirator [ventilator] status: Secondary | ICD-10-CM

## 2015-09-11 DIAGNOSIS — J988 Other specified respiratory disorders: Secondary | ICD-10-CM

## 2015-09-11 LAB — POCT I-STAT 3, ART BLOOD GAS (G3+)
Acid-base deficit: 6 mmol/L — ABNORMAL HIGH (ref 0.0–2.0)
Bicarbonate: 21.3 mEq/L (ref 20.0–24.0)
O2 Saturation: 97 %
Patient temperature: 98.6
TCO2: 23 mmol/L (ref 0–100)
pCO2 arterial: 50.8 mmHg — ABNORMAL HIGH (ref 35.0–45.0)
pH, Arterial: 7.23 — ABNORMAL LOW (ref 7.350–7.450)
pO2, Arterial: 105 mmHg — ABNORMAL HIGH (ref 80.0–100.0)

## 2015-09-11 LAB — GLUCOSE, CAPILLARY
Glucose-Capillary: 122 mg/dL — ABNORMAL HIGH (ref 65–99)
Glucose-Capillary: 133 mg/dL — ABNORMAL HIGH (ref 65–99)
Glucose-Capillary: 140 mg/dL — ABNORMAL HIGH (ref 65–99)
Glucose-Capillary: 153 mg/dL — ABNORMAL HIGH (ref 65–99)
Glucose-Capillary: 176 mg/dL — ABNORMAL HIGH (ref 65–99)
Glucose-Capillary: 188 mg/dL — ABNORMAL HIGH (ref 65–99)
Glucose-Capillary: 201 mg/dL — ABNORMAL HIGH (ref 65–99)

## 2015-09-11 LAB — CBC
HCT: 32.5 % — ABNORMAL LOW (ref 36.0–46.0)
Hemoglobin: 10.7 g/dL — ABNORMAL LOW (ref 12.0–15.0)
MCH: 28.7 pg (ref 26.0–34.0)
MCHC: 32.9 g/dL (ref 30.0–36.0)
MCV: 87.1 fL (ref 78.0–100.0)
Platelets: 262 10*3/uL (ref 150–400)
RBC: 3.73 MIL/uL — ABNORMAL LOW (ref 3.87–5.11)
RDW: 12.6 % (ref 11.5–15.5)
WBC: 9.3 10*3/uL (ref 4.0–10.5)

## 2015-09-11 LAB — BASIC METABOLIC PANEL
Anion gap: 12 (ref 5–15)
BUN: 22 mg/dL — ABNORMAL HIGH (ref 6–20)
CO2: 19 mmol/L — ABNORMAL LOW (ref 22–32)
Calcium: 8.9 mg/dL (ref 8.9–10.3)
Chloride: 109 mmol/L (ref 101–111)
Creatinine, Ser: 1.14 mg/dL — ABNORMAL HIGH (ref 0.44–1.00)
GFR calc Af Amer: 58 mL/min — ABNORMAL LOW (ref >60)
GFR calc non Af Amer: 50 mL/min — ABNORMAL LOW (ref >60)
Glucose, Bld: 216 mg/dL — ABNORMAL HIGH (ref 65–99)
Potassium: 4.4 mmol/L (ref 3.5–5.1)
Sodium: 140 mmol/L (ref 135–145)

## 2015-09-11 LAB — PHOSPHORUS: Phosphorus: 4.5 mg/dL (ref 2.5–4.6)

## 2015-09-11 LAB — MAGNESIUM: Magnesium: 1.7 mg/dL (ref 1.7–2.4)

## 2015-09-11 MED ORDER — ANTISEPTIC ORAL RINSE SOLUTION (CORINZ)
7.0000 mL | Freq: Four times a day (QID) | OROMUCOSAL | Status: DC
  Administered 2015-09-12 – 2015-09-18 (×13): 7 mL via OROMUCOSAL

## 2015-09-11 MED ORDER — PRO-STAT SUGAR FREE PO LIQD
60.0000 mL | Freq: Two times a day (BID) | ORAL | Status: DC
  Administered 2015-09-11: 60 mL
  Filled 2015-09-11: qty 60

## 2015-09-11 MED ORDER — VITAL HIGH PROTEIN PO LIQD: 1000.0000 mL | ORAL | Status: DC

## 2015-09-11 MED ORDER — VITAL HIGH PROTEIN PO LIQD
1000.0000 mL | ORAL | Status: DC
Start: 2015-09-12 — End: 2015-09-11

## 2015-09-11 MED ORDER — VITAL HIGH PROTEIN PO LIQD
1000.0000 mL | ORAL | Status: DC
  Administered 2015-09-11: 1000 mL
  Administered 2015-09-11: 11:00:00

## 2015-09-11 MED ORDER — CHLORHEXIDINE GLUCONATE 0.12% ORAL RINSE (MEDLINE KIT)
15.0000 mL | Freq: Two times a day (BID) | OROMUCOSAL | Status: DC
  Administered 2015-09-11 – 2015-09-18 (×10): 15 mL via OROMUCOSAL

## 2015-09-11 NOTE — Progress Notes (Signed)
PULMONARY / CRITICAL CARE MEDICINE   Name: Lindsey Melton MRN: [id number] DOB: February 16, 1951    ADMISSION DATE:  09/10/2015 CONSULTATION DATE:  09/10/2015  REFERRING MD:  Dr. Shon Baton  CHIEF COMPLAINT:  Neck pain  SUBJECTIVE:  No cuff leak  VITAL SIGNS: BP 114/72 mmHg  Pulse 87  Temp(Src) 97.7 F (36.5 C) (Oral)  Resp 16  Ht  (1.499 m)  Wt 146 lb 9.7 oz (66.5 kg)  BMI 29.60 kg/m2  SpO2 99%  VENTILATOR SETTINGS: Vent Mode:  [-] PRVC FiO2 (%):  [40 %-50 %] 40 % Set Rate:  [12 bmp-16 bmp] 16 bmp Vt Set:  [350 mL] 350 mL PEEP:  [5 cmH20] 5 cmH20 Plateau Pressure:  [15 cmH20-19 cmH20] 15 cmH20  INTAKE / OUTPUT: I/O last 3 completed shifts: In: 3849.3 [I.V.:3749.3; IV Piggyback:100] Out: 1075 [Urine:1050; Blood:25]  PHYSICAL EXAMINATION: General:  Follows commands Neuro:  RASS -1 HEENT:  C collar in place Cardiovascular:  Regular, no murmur Lungs:  No wheeze Abdomen:  Soft, non tender Musculoskeletal:  No edema Skin:  No rashes  LABS:  BMET  Recent Labs Lab 09/10/15 2140 09/11/15 0320  NA 143 140  K 4.4 4.4  CL 109 109  CO2 21* 19*  BUN 24* 22*  CREATININE 1.17* 1.14*  GLUCOSE 196* 216*    Electrolytes  Recent Labs Lab 09/10/15 2140 09/11/15 0320  CALCIUM 9.3 8.9  MG  --  1.7  PHOS  --  4.5    CBC  Recent Labs Lab 09/10/15 2140 09/11/15 0320  WBC 9.3 9.3  HGB 11.4* 10.7*  HCT 35.5* 32.5*  PLT 268 262    ABG No results for input(s): PHART, PCO2ART, PO2ART in the last 168 hours.  Liver Enzymes  Recent Labs Lab 09/10/15 2140  AST 23  ALT 14  ALKPHOS 104  BILITOT 0.4  ALBUMIN 3.3*    Glucose  Recent Labs Lab 09/10/15 0946 09/10/15 1142 09/10/15 1418 09/10/15 2016 09/10/15 2342 09/11/15 0409  GLUCAP 111* 84 83 175* 188* 201*    Imaging Dg Cervical Spine 2-3 Views  09/10/2015  CLINICAL DATA:  Intraoperative imaging.  C3-4 posterior fusion. EXAM: CERVICAL SPINE - 2-3 VIEW; DG C-ARM 61-120 MIN COMPARISON:  None.  FINDINGS: We are provided with 2 fluoroscopic spot views of the cervical spine. Images demonstrate pedicle screws and stabilization bars in place at C3-4. The patient is status post prior anterior discectomy and fusion at this level. No acute abnormality. IMPRESSION: C3-4 posterior fusion. Electronically Signed   By: Drusilla Kanner M.D.   On: 09/10/2015 18:21   Dg Chest Port 1 View  09/10/2015  CLINICAL DATA:  65 year old female with history of respiratory failure. EXAM: PORTABLE CHEST 1 VIEW COMPARISON:  Chest x-ray 06/30/2015. FINDINGS: Endotracheal tube in position with tip 2.9 cm above the carina. Patchy interstitial and airspace opacities throughout the mid to lower lungs bilaterally. Low lung volumes. No pleural effusions. No evidence of pulmonary edema. Heart size is normal. Upper mediastinal contours are within normal limits. IMPRESSION: 1. Endotracheal tube tip 2.9 cm above the carina. 2. Low lung volumes with ill-defined interstitial and airspace opacities throughout the mid to lower lungs bilaterally, which could reflect sequela of recent aspiration or developing infection. Electronically Signed   By: Trudie Reed M.D.   On: 09/10/2015 21:37   Dg C-arm 61-120 Min  09/10/2015  CLINICAL DATA:  Intraoperative imaging.  C3-4 posterior fusion. EXAM: CERVICAL SPINE - 2-3 VIEW; DG C-ARM 61-120 MIN COMPARISON:  None.  FINDINGS: We are provided with 2 fluoroscopic spot views of the cervical spine. Images demonstrate pedicle screws and stabilization bars in place at C3-4. The patient is status post prior anterior discectomy and fusion at this level. No acute abnormality. IMPRESSION: C3-4 posterior fusion. Electronically Signed   By: Drusilla Kanner M.D.   On: 09/10/2015 18:21     STUDIES:   CULTURES:  ANTIBIOTICS:  SIGNIFICANT EVENTS: 3/01 Admit C3-4 decompression and fusion >> no cuff leak so left with ETT 3/02 No cuff leak  LINES/TUBES: 3/01 ETT >>   DISCUSSION: 65 yo female former  smoker with upper extremity numbness and pain from cervical myelopathy.  She had anterior C3-4 decompression and fusion, and posterior cervical fusion with foraminotomy on 3/01.  She remained on vent post-op due to concern for upper airway edema.  She has PMhx of HTN, HLD, DM, COPD, Vertigo, Anxiety/depression  ASSESSMENT / PLAN:  PULMONARY A: Compromised airway. Hx of COPD. P:   Pressure support wean as tolerated  Continue decadron for upper airway edema Brovana, pulmicort with prn albuterol F/u CXR  CARDIOVASCULAR A:  Hx of HTN, HLD. P:  Continue lipitor Hold outpt ASA, lisinopril for now  RENAL A:   No acute issues. P:   Monitor renal fx, urine outpt  GASTROINTESTINAL A:   Nutrition. P:   Place OG tube and start tube feeds Protonix for SUP  HEMATOLOGIC A:   Anemia. P:  F/u CBC SCDs for DVT prevention >> add lovenox when okay with ortho  INFECTIOUS A:   No evidence for infection. P:   Monitor clinically  ENDOCRINE A:   Steroid induced hyperglycemia.   P:   SSI while on decadron  NEUROLOGIC A:   Cervical myelopathy s/p C3-4 decompression with fusion. P:   RASS goal: 0 to -1 Post op care per orthopedics  CC time 32 minutes.  Coralyn Helling, MD [facility] Pulmonary/Critical Care 09/11/2015, 7:36 AM Pager:  [phone] After 3pm call: [phone]

## 2015-09-11 NOTE — Progress Notes (Signed)
Initial Nutrition Assessment  DOCUMENTATION CODES:   Not applicable  INTERVENTION:    Vital High Protein formula at goal rate of 20 ml/hr with Prostat liquid protein 60 ml BID  Above TF regimen + Propofol infusion to provide 1273 total kcals, 102 gm protien, 401 ml of free water  NUTRITION DIAGNOSIS:   Inadequate oral intake related to inability to eat as evidenced by NPO status  GOAL:   Patient will meet greater than or equal to 90% of their needs  MONITOR:   TF tolerance, Vent status, Labs, Weight trends, I & O's  REASON FOR ASSESSMENT:   Consult Enteral/tube feeding initiation and management  ASSESSMENT:   65 yo Female with COPD, severe cervical spine stenosis with myelopathy, C3-4 admitted for diskectomy and fusion. Post op she was not extubated as there was no cuff leak. PCCM consulted for management of vent.  Patient s/p procedure 3/2: ANTERIOR CERVICAL DISKECTOMY AND FUSION POSTERIOR C3-4 DECOMPRESSION AND POSTERIOR C3-4 INSTRUMENTED FUSION  Patient is currently intubated on ventilator support -- NGT in  place MV: 5.9 L/min Temp (24hrs), Avg:97.9 F (36.6 C), Min:97.7 F (36.5 C), Max:98.1 F (36.7 C)   Propofol: 14.9 ml/hr -----> 393 fat kcals  CCM note 3/2 reviewed.  Patient remained intubated post-op due to concern for upper airway edema.  Nutrition focused physical exam completed.  No muscle or subcutaneous fat depletion noticed.  Diet Order:  Diet NPO time specified  Skin:  Reviewed, no issues  Last BM:  N/A  Height:   Ht Readings from Last 1 Encounters:  09/10/15  (1.499 m)    Weight:   Wt Readings from Last 1 Encounters:  09/11/15 146 lb 9.7 oz (66.5 kg)    Ideal Body Weight:  44.6 kg  BMI:  Body mass index is 29.6 kg/(m^2).  Estimated Nutritional Needs:   Kcal:  1175  Protein:  100-110 gm  Fluid:  per MD  EDUCATION NEEDS:   No education needs identified at this time  Maureen Chatters, RD, LDN Pager #:  [phone] After-Hours Pager #: [phone]

## 2015-09-11 NOTE — Progress Notes (Signed)
UR Completed. Aamira Bischoff, RN, BSN.  [phone] 

## 2015-09-11 NOTE — Progress Notes (Signed)
Physical medicine rehabilitation consult requested chart reviewed. Patient status post anterior cervical discectomy and currently intubated. Will await extubation and formal physical occupational therapy evaluations and follow-up with appropriate recommendations

## 2015-09-11 NOTE — Progress Notes (Signed)
Patient ID: Lindsey Melton, female   DOB: 1950-11-19, 65 y.o.   MRN: [id number]    Subjective: 1 Day Post-Op Procedure(s) (LRB): ANTERIOR C3-4 DECOMPRESSION AND FUSION (N/A) POSTERIOR CERVICAL FUSION/FORAMINOTOMY Cervical 3-4 (N/A) The pt continues to be intubated in [facility] - ICU The pt is not following commands  Objective: Vital signs in last 24 hours: Temp:  [97.7 F (36.5 C)-98.5 F (36.9 C)] 97.7 F (36.5 C) (03/02 0400) Pulse Rate:  [69-88] 87 (03/02 0700) Resp:  [12-20] 16 (03/02 0700) BP: (83-129)/(50-80) 114/72 mmHg (03/02 0700) SpO2:  [96 %-100 %] 99 % (03/02 0700) FiO2 (%):  [40 %-50 %] 40 % (03/02 0600) Weight:  [62.143 kg (137 lb)-66.5 kg (146 lb 9.7 oz)] 66.5 kg (146 lb 9.7 oz) (03/02 0500)  Intake/Output from previous day: 03/01 0701 - 03/02 0700 In: 3849.3 [I.V.:3749.3; IV Piggyback:100] Out: 1075 [Urine:1050; Blood:25] Intake/Output this shift:    Labs:  Recent Labs  09/10/15 2140 09/11/15 0320  HGB 11.4* 10.7*    Recent Labs  09/10/15 2140 09/11/15 0320  WBC 9.3 9.3  RBC 4.03 3.73*  HCT 35.5* 32.5*  PLT 268 262    Recent Labs  09/10/15 2140 09/11/15 0320  NA 143 140  K 4.4 4.4  CL 109 109  CO2 21* 19*  BUN 24* 22*  CREATININE 1.17* 1.14*  GLUCOSE 196* 216*  CALCIUM 9.3 8.9   No results for input(s): LABPT, INR in the last 72 hours.  Physical Exam:  ABD soft Incision: dressing C/D/I No cellulitis present Compartment soft Cervical collar in place Assessment/Plan: 1 Day Post-Op Procedure(s) (LRB): ANTERIOR C3-4 DECOMPRESSION AND FUSION (N/A) POSTERIOR CERVICAL FUSION/FORAMINOTOMY Cervical 3-4 (N/A) Pt still does not pass the leak test It is recomended she stay intubated one more day Continue care in ICU  [place], Baxter Kail for Dr. Venita Lick [facility] [phone] 09/11/2015, 7:31 AM    Per Nsg staff: patient was moving all four extremities when sedation decreased for assessment Remains  intubated Overall stable Will continue to monitor

## 2015-09-11 NOTE — Care Management Note (Addendum)
Case Management Note  Patient Details  Name: Toniette Devera MRN: [id number] Date of Birth: 01-Feb-1951  Subjective/Objective:   Pt is post spinal surgery - remained intubated post procedure due to swelling                 Action/Plan:  Pt is from home with daughter Eunice Blase [phone].  Per daughter; pt was very dependent for ADLs,  Per daughter; since January pt hasn't been able to independently ambulate nor independently perform ADLs  "she has only been  able to feed herself and brush her teeth with her right hand.  Daughter is interested in CIR/SNF as back up plan (CIR already consulted).  PT/OT assessment will be peformed post extubation.  CM will continue to monitor for disposition needs.   Expected Discharge Date:                  Expected Discharge Plan:  Home/Self Care  In-House Referral:     Discharge planning Services  CM Consult  Post Acute Care Choice:    Choice offered to:     DME Arranged:    DME Agency:     HH Arranged:    HH Agency:     Status of Service:  In process, will continue to follow  Medicare Important Message Given:    Date Medicare IM Given:    Medicare IM give by:    Date Additional Medicare IM Given:    Additional Medicare Important Message give by:     If discussed at Long Length of Stay Meetings, dates discussed:    Additional Comments:  Cherylann Parr, RN 09/11/2015, 4:07 PM

## 2015-09-11 NOTE — Progress Notes (Addendum)
PT Cancellation Note  Patient Details Name: Shira Bobst MRN: [id number] DOB: 03/20/1951   Cancelled Treatment:    Reason Eval/Treat Not Completed: Patient not medically ready. Pt remains intubated s/p ACDF C 3-4 on propofol. Plan for possible extubation tomorrow. Will follow.   Blake Divine A Peyson Delao 09/11/2015, 1:01 PM  Mylo Red, PT, DPT [phone]

## 2015-09-11 NOTE — Progress Notes (Signed)
OT Cancellation Note  Patient Details Name: Lindsey Melton MRN: [id number] DOB: 09-13-1950   Cancelled Treatment:    Reason Eval/Treat Not Completed: Patient not medically ready. Pt remains intubated s/p ACDF C 3-4. Will follow.  Evern Bio 09/11/2015, 9:35 AM  [phone]

## 2015-09-11 NOTE — Progress Notes (Signed)
eLink Physician-Brief Progress Note Patient Name: Lindsey Melton DOB: 1950/10/26 MRN: [id number]   Date of Service  09/11/2015  HPI/Events of Note    eICU Interventions  Clarified TF orders, started this pm     Intervention Category Minor Interventions: Routine modifications to care plan (e.g. PRN medications for pain, fever)  Doreather Hoxworth S. 09/11/2015, 5:23 PM

## 2015-09-11 NOTE — Op Note (Signed)
Lindsey Melton, Lindsey Melton               ACCOUNT NO.:  [id number]  MEDICAL RECORD NO.:  [id number]  LOCATION:  2S09C                        FACILITY:  [facility]  PHYSICIAN:  Tessi Eustache D. Shon Baton, M.D. DATE OF BIRTH:  1950-08-04  DATE OF PROCEDURE:  09/10/2015 DATE OF DISCHARGE:                              OPERATIVE REPORT   PREOPERATIVE DIAGNOSIS:  Severe cervical spine stenosis with myelopathy, C3-4.  POSTOPERATIVE DIAGNOSIS:  Severe cervical spine stenosis with myelopathy, C3-4.  PROCEDURES: 1. Anterior cervical diskectomy and fusion. 2. Posterior C3-4 decompression and posterior C3-4 instrumented     fusion.  COMPLICATIONS:  None.  CONDITION:  Stable.  FIRST ASSISTANT:  [facility].  Instrumentation system used:  0 profile Titan titanium intervertebral cage, packed with DBX mix, used two 14 mm locking screws, posterior DePuy mountaineer lateral mass screw fixation with 14 mm lateral mass screws with contoured rod.  HISTORY:  This is a very pleasant 65 year old woman, who has had progressive cervical spondylitic myelopathy and has lost the ability to ambulate.  She presented to my office.  After discussing treatment plans and options, she ultimately elected to proceed with surgery.  All appropriate risks, benefits, and alternatives were discussed with the patient and consent was obtained.  OPERATIVE NOTE:  The patient was brought to the operating room, placed supine on the operating table.  After successful induction of general anesthesia and endotracheal intubation, TED's and SCDs were applied. The arms were tucked to the side, and the anterior cervical spine was prepped and draped in a standard fashion.  Time-out was taken confirming patient, procedure, and all other pertinent important data.  Once this was done, I used x-ray to identify the C3-4 disc space and marked the incision site.  I infiltrated with 0.25% Marcaine and then made an incision.  This was a transverse  incision starting midline and proceeding to the left.  I then performed a standard Smith-Robinson approach to the anterior cervical spine.  I sharply dissected down along the medial border of the sternocleidomastoid, swept the trachea and esophagus to the right and protected it.  Identified and protected the carotid sheath laterally.  I used Pension scheme manager to dissect the remaining portion of the prevertebral fascia and expose the anterior longitudinal ligament. I then placed a needle into the 3-4 disk and took an intraoperative x- ray and confirmed that was at the appropriate level.  I then mobilized the longus colli muscles from the midbody of C3 to the midbody of C4 and then placed retractors underneath the longus colli muscle.  I deflated the endotracheal cuff, expanded the retractor to the appropriate width, and then reinflated the cuff.  An annulotomy was performed with a 15 blade scalpel, and then the bulk of the disk material was removed using pituitary rongeur.  I then placed distraction pins into the body of C3 and 4 and then distracted the intervertebral space and then maintained it with distraction pins.  I continued to work posteriorly using neuro curette removing the remaining posterior portion of the disk and anulus.  I then used a fine nerve hook to gently dissect through the remaining anulus and posterior longitudinal ligament.  I then used a 1  mm Kerrison to resect the PLL and annulus and exposed the thecal sac.  There were some hard disc bone spurs noted which were down to allow for an adequate decompression.  At this point, I had released the PLL and had an adequate decompression.  I rasped the endplates to remove all of the cartilaginous portion and expose bleeding subchondral bone.  I then trialed and then placed the size 7 Titan titanium intervertebral cage.  I had excellent purchase and fixation.  I then placed 2 locking screws through the cage into the  vertebral, one into the vertebral body of C3 and the other in 4.  Both screws had excellent purchase.  I then irrigated the wound copiously with normal saline and then made sure I had hemostasis using bipolar electrocautery and FloSeal.  Once I had hemostasis, I then returned the trachea and esophagus to midline, removed all the retractors and closed the platysma with interrupted 2-0 Vicryl suture and a 3-0 Monocryl for the skin. Steri-Strips were then applied as was the dry dressing.  At this point in time, the drapes were removed.  A Foley was placed, and the patient's arms were kept at the side and the spine frame was secured over the patient.  She was then rotated into the prone position.  All bony prominences were well padded and the back of the cervical thoracic spine was prepped and draped in standard fashion.  A second time-out was taken just to confirm the ongoing procedure on the same patient.  I infiltrated the midline incision and then made a midline incision starting just below the inion of the skull and proceeding down to about the C5 spinous process.  I sharply dissected using Bovie down the midline.  I then palpated the C5 and C4 spinous processes, and then on the ipsilateral side that I was standing on, I began dissecting the paraspinal muscles using Bovie and Cobb to expose the spinous process.  The right side of C3 was somewhat congenitally deformed.  I could not get a sense of a stable lateral mass and facet complex on that side.  As such, I gently dissected out until I had both sides exposed.  I took x- rays to confirm the C3-C4 level.  At this point, since I had good fixation anteriorly, I elected just to have a unilateral posterior construct.  I then used my awl, placed it on the inferior inner quadrant of the lateral mass and then drilled aiming slightly lateral and caudal. I then tapped with a 14 mm Kerrison, tapped the hole and palpated it.  I repeated this  procedure at C3.  Once I had both lateral masses drilled, I then proceeded my decompression.  I elected to start where I had normal anatomy.  I removed the posterior aspect of the spinous process of C4 and then started dissecting along the lamina of C4.  I dissected and created a plane between the ligamentum flavum and lateral in the lamina and then used my 1 and 2 mm Kerrison punch to perform a complete laminectomy on the lateral side of C4.  On the contralateral side, I did the same thing.  This allowed me to gently elevate and removed the posterior portion of C4.  Once this was done, I could visualize the thecal sac and I could see the cystic-like structure.  It was significantly adherent to the thecal sac.  I started to attempt to gently dissected off the thecal sac using a Penfield 4, but  it was very adherent.  I was quite concerned about a tear to the thecal sac and neural injury.  I then elected to leave that and I performed a laminotomy of C3, specially midline over to the left which was where the maximum compression was based on the MRI.  At this point, I checked my x-ray to ensure that my decompression was above the disk space confirming that I had adequately decompressed the area of maximum compression based on the MRI.  I irrigated the wound copiously with normal saline.  At this point, the neuromonitoring representative again tested the SSEP and free running motors, and there was no decrease in activity or evidence of spinal cord injury.  At this point, I had an adequate decompression, and so I placed the left C3 and C4 lateral mass screws and cut and contoured a rod and secured it to the construct.  The locking nuts were applied and then torqued off according to manufacturer's instructions.  I then obtained hemostasis using bipolar electrocautery and FloSeal and then irrigated the wound copiously with normal saline.  Once I had hemostasis, I closed the deep fascia using  a barbed #1 suture and this closed quite well.  I then closed superficially with 2-0 Vicryl suture and a 2-0 Prolene for the skin. Steri-Strips and Aquacel dressing were applied.  Patient noted to be swollen after anterior / posterior surgery and so the decision was made to keep her intubated and admitted to ICU.  At the end of the case, all needle and sponge counts were correct.  No adverse intraoperative neuromonitoring events.     Tylesha Gibeault D. Shon Baton, M.D.     DDB/MEDQ  D:  09/10/2015  T:  09/10/2015  Job:  [id number]  cc:   [facility]

## 2015-09-12 ENCOUNTER — Encounter (HOSPITAL_COMMUNITY): Payer: Self-pay | Admitting: Orthopedic Surgery

## 2015-09-12 ENCOUNTER — Inpatient Hospital Stay (HOSPITAL_COMMUNITY): Payer: BLUE CROSS/BLUE SHIELD

## 2015-09-12 LAB — BASIC METABOLIC PANEL
Anion gap: 11 (ref 5–15)
BUN: 29 mg/dL — ABNORMAL HIGH (ref 6–20)
CO2: 20 mmol/L — ABNORMAL LOW (ref 22–32)
Calcium: 9 mg/dL (ref 8.9–10.3)
Chloride: 110 mmol/L (ref 101–111)
Creatinine, Ser: 0.98 mg/dL (ref 0.44–1.00)
GFR calc Af Amer: >60 mL/min (ref >60)
GFR calc non Af Amer: 60 mL/min — ABNORMAL LOW (ref >60)
Glucose, Bld: 180 mg/dL — ABNORMAL HIGH (ref 65–99)
Potassium: 4.1 mmol/L (ref 3.5–5.1)
Sodium: 141 mmol/L (ref 135–145)

## 2015-09-12 LAB — GLUCOSE, CAPILLARY
Glucose-Capillary: 114 mg/dL — ABNORMAL HIGH (ref 65–99)
Glucose-Capillary: 143 mg/dL — ABNORMAL HIGH (ref 65–99)
Glucose-Capillary: 143 mg/dL — ABNORMAL HIGH (ref 65–99)
Glucose-Capillary: 149 mg/dL — ABNORMAL HIGH (ref 65–99)
Glucose-Capillary: 173 mg/dL — ABNORMAL HIGH (ref 65–99)
Glucose-Capillary: 183 mg/dL — ABNORMAL HIGH (ref 65–99)

## 2015-09-12 LAB — CBC
HCT: 32.7 % — ABNORMAL LOW (ref 36.0–46.0)
Hemoglobin: 10.7 g/dL — ABNORMAL LOW (ref 12.0–15.0)
MCH: 28.6 pg (ref 26.0–34.0)
MCHC: 32.7 g/dL (ref 30.0–36.0)
MCV: 87.4 fL (ref 78.0–100.0)
Platelets: 310 10*3/uL (ref 150–400)
RBC: 3.74 MIL/uL — ABNORMAL LOW (ref 3.87–5.11)
RDW: 12.6 % (ref 11.5–15.5)
WBC: 10.3 10*3/uL (ref 4.0–10.5)

## 2015-09-12 MED ORDER — DIPHENHYDRAMINE HCL 50 MG/ML IJ SOLN
50.0000 mg | Freq: Four times a day (QID) | INTRAMUSCULAR | Status: DC | PRN
  Administered 2015-09-12 – 2015-09-14 (×3): 50 mg via INTRAVENOUS
  Filled 2015-09-12 (×3): qty 1

## 2015-09-12 MED ORDER — MORPHINE SULFATE (PF) 2 MG/ML IV SOLN
2.0000 mg | INTRAVENOUS | Status: DC | PRN
  Administered 2015-09-12 – 2015-09-13 (×5): 2 mg via INTRAVENOUS
  Filled 2015-09-12 (×6): qty 1

## 2015-09-12 NOTE — Evaluation (Signed)
Occupational Therapy Evaluation Patient Details Name: Lindsey Melton Lady MRN: [id number] DOB: Jul 16, 1950 Today's Date: 09/12/2015    History of Present Illness Pt admitted for ACDF C3-4. Intubated 3/1-3/3. PMH: COPD, HTN, HLD, vertigo, anxiety and depression.   Clinical Impression   Prior to hospitalization in November, pt was independent and active. She has demonstrated a gradual decline in mobility and ability to care for herself since mid-January per her daughter. Pt presents with generalized weakness, impaired cognition, emotional lability and decreased balance. Began instruction in cervical precautions. Pt currently requiring +2 assistance for all mobility.  Pt likely to progress well with intensive rehab.  Recommending CIR. Will follow acutely.    Follow Up Recommendations  CIR;Supervision/Assistance - 24 hour    Equipment Recommendations  3 in 1 bedside comode    Recommendations for Other Services Rehab consult     Precautions / Restrictions Precautions Precautions: Fall;Cervical Required Braces or Orthoses: Cervical Brace Cervical Brace: Hard collar (when OOB) Restrictions Weight Bearing Restrictions: No      Mobility Bed Mobility Overal bed mobility: +2 for physical assistance;Needs Assistance Bed Mobility: Rolling;Sidelying to Sit Rolling: +2 for physical assistance;Min guard Sidelying to sit: +2 for physical assistance;Min assist       General bed mobility comments: instructed in log roll technique, pt very eager to get OOB  Transfers Overall transfer level: Needs assistance Equipment used: 2 person hand held assist Transfers: Sit to/from Stand;Stand Pivot Transfers Sit to Stand: +2 physical assistance;Min assist Stand pivot transfers: Min assist;+2 physical assistance       General transfer comment: assist to rise and for balance and she pivoted to chair    Balance Overall balance assessment: Needs assistance   Sitting balance-Leahy Scale: Fair        Standing balance-Leahy Scale: Poor                              ADL Overall ADL's : Needs assistance/impaired Eating/Feeding: NPO   Grooming: Maximal assistance;Sitting Grooming Details (indicate cue type and reason): can wipe nose Upper Body Bathing: Maximal assistance;Sitting   Lower Body Bathing: +2 for physical assistance;Maximal assistance;Sit to/from stand   Upper Body Dressing : Moderate assistance;Sitting   Lower Body Dressing: Maximal assistance;Sit to/from stand;+2 for physical assistance   Toilet Transfer: Minimal assistance;BSC;+2 for physical assistance   Toileting- Clothing Manipulation and Hygiene: Maximal assistance;Sit to/from stand;+2 for physical assistance               Vision     Perception     Praxis      Pertinent Vitals/Pain Pain Assessment: Faces Faces Pain Scale: Hurts little more Pain Location: head Pain Descriptors / Indicators: Aching Pain Intervention(s): Patient requesting pain meds-RN notified     Hand Dominance Right   Extremity/Trunk Assessment Upper Extremity Assessment Upper Extremity Assessment: Generalized weakness (moderate edema)   Lower Extremity Assessment Lower Extremity Assessment: Defer to PT evaluation       Communication Communication Communication: No difficulties   Cognition Arousal/Alertness: Awake/alert Behavior During Therapy: Impulsive (labile) Overall Cognitive Status: Impaired/Different from baseline Area of Impairment: Orientation;Attention;Memory;Safety/judgement;Problem solving Orientation Level: Disoriented to;Time;Situation Current Attention Level: Sustained Memory: Decreased short-term memory   Safety/Judgement: Decreased awareness of safety;Decreased awareness of deficits   Problem Solving: Slow processing;Difficulty sequencing;Requires verbal cues     General Comments       Exercises       Shoulder Instructions      Home Living Family/patient expects  to be  discharged to:: Private residence Living Arrangements: Children (daughter) Available Help at Discharge: Family;Available PRN/intermittently Type of Home: House Home Access: Stairs to enter [employer] of Steps: 2   Home Layout: One level     Bathroom Shower/Tub: Chief Strategy Officer: Standard     Home Equipment: Environmental consultant - 2 wheels;Cane - single point;Shower seat          Prior Functioning/Environment Level of Independence: Needs assistance  Gait / Transfers Assistance Needed: pt's daughter was holding pt up under her shoulders from behind to assist her in walking  ADL's / Homemaking Assistance Needed: assist for bathing, dressing, toileting, could self feed and perform grooming, total assist for IADL        OT Diagnosis: Generalized weakness;Cognitive deficits;Acute pain   OT Problem List: Decreased strength;Decreased activity tolerance;Impaired balance (sitting and/or standing);Decreased knowledge of use of DME or AE;Impaired UE functional use;Pain;Decreased safety awareness;Decreased cognition   OT Treatment/Interventions: Self-care/ADL training;DME and/or AE instruction;Therapeutic activities;Patient/family education;Balance training;Cognitive remediation/compensation    OT Goals(Current goals can be found in the care plan section) Acute Rehab OT Goals Patient Stated Goal: Walk, regain memory OT Goal Formulation: With patient Time For Goal Achievement: 09/26/15 Potential to Achieve Goals: Good ADL Goals Pt Will Perform Grooming: with supervision;standing Pt Will Perform Upper Body Bathing: with supervision;sitting Pt Will Perform Lower Body Bathing: with supervision;sit to/from stand Pt Will Perform Upper Body Dressing: with supervision;sitting Pt Will Perform Lower Body Dressing: with supervision;sit to/from stand Pt Will Transfer to Toilet: with supervision;ambulating;regular height toilet Pt Will Perform Toileting - Clothing Manipulation and  hygiene: with supervision;sit to/from stand Pt Will Perform Tub/Shower Transfer: Tub transfer;with min guard assist;shower seat;ambulating Additional ADL Goal #1: Pt will generalize cervical precautions in ADL with supervision.  OT Frequency: Min 2X/week   Barriers to D/C:            Co-evaluation PT/OT/SLP Co-Evaluation/Treatment: Yes Reason for Co-Treatment: For patient/therapist safety;Necessary to address cognition/behavior during functional activity   OT goals addressed during session: ADL's and self-care      End of Session Nurse Communication: Mobility status (ok to leave 02 off)  Activity Tolerance: Patient tolerated treatment well Patient left: in chair;with call bell/phone within reach   Time: 1337-1410 OT Time Calculation (min): 33 min Charges:  OT General Charges $OT Visit: 1 Procedure OT Evaluation $OT Eval Moderate Complexity: 1 Procedure G-Codes:    Evern Bio 09/12/2015, 2:30 PM  [phone]

## 2015-09-12 NOTE — Progress Notes (Signed)
SLP Cancellation Note  Patient Details Name: Lindsey Melton MRN: [id number] DOB: 11-Mar-1951   Cancelled treatment:       Reason Eval/Treat Not Completed: Patient not medically ready. Will f/u with swallow eval 3/4 to allow at least 24 hrs post extubation in setting of airway edema.    Vernessa Likes, Riley NearingBonnie Caroline 09/12/2015, 11:51 AM

## 2015-09-12 NOTE — Progress Notes (Signed)
Dr. Delton CoombesByrum paged and made aware of pt c/o HA, getting morphine for pain with increased confusion and stating "I can't seem to get my words out", MD aware, no interventions at this time, will continue to monitor. Louie BunWilson,Katilin Raynes S 5:00 PM

## 2015-09-12 NOTE — Progress Notes (Addendum)
PULMONARY / CRITICAL CARE MEDICINE   Name: Lindsey Melton MRN: [id number] DOB: [birth year]    ADMISSION DATE:  09/10/2015 CONSULTATION DATE:  09/10/2015  REFERRING MD:  Dr. Shon BatonBrooks  CHIEF COMPLAINT:  Neck pain  SUBJECTIVE:  Better cuff leak.  Tolerating pressure support.  Developed rash since yesterday.  VITAL SIGNS: BP 123/75 mmHg  Pulse 82  Temp(Src) 98.2 F (36.8 C) (Axillary)  Resp 18  Ht 4\' 11"  (1.499 m)  Wt 146 lb 9.7 oz (66.5 kg)  BMI 29.60 kg/m2  SpO2 100%  VENTILATOR SETTINGS: Vent Mode:  [-] PRVC FiO2 (%):  [40 %] 40 % Set Rate:  [16 bmp] 16 bmp Vt Set:  [350 mL] 350 mL PEEP:  [5 cmH20] 5 cmH20 Plateau Pressure:  [9 cmH20-15 cmH20] 15 cmH20  INTAKE / OUTPUT: I/O last 3 completed shifts: In: 8541.4 [I.V.:7597; NG/GT:844.3; IV Piggyback:100] Out: 3235 [Urine:3235]  PHYSICAL EXAMINATION: General:  Follows commands Neuro:  RASS 0, moves extremities HEENT:  C collar in place Cardiovascular:  Regular, no murmur Lungs:  No wheeze Abdomen:  Soft, non tender Musculoskeletal:  No edema Skin:  Faint, red rash on arms b/l  LABS:  BMET  Recent Labs Lab 09/10/15 2140 09/11/15 0320 09/12/15 0250  NA 143 140 141  K 4.4 4.4 4.1  CL 109 109 110  CO2 21* 19* 20*  BUN 24* 22* 29*  CREATININE 1.17* 1.14* 0.98  GLUCOSE 196* 216* 180*    Electrolytes  Recent Labs Lab 09/10/15 2140 09/11/15 0320 09/12/15 0250  CALCIUM 9.3 8.9 9.0  MG  --  1.7  --   PHOS  --  4.5  --     CBC  Recent Labs Lab 09/10/15 2140 09/11/15 0320 09/12/15 0250  WBC 9.3 9.3 10.3  HGB 11.4* 10.7* 10.7*  HCT 35.5* 32.5* 32.7*  PLT 268 262 310    ABG  Recent Labs Lab 09/10/15 2102  PHART 7.230*  PCO2ART 50.8*  PO2ART 105.0*    Liver Enzymes  Recent Labs Lab 09/10/15 2140  AST 23  ALT 14  ALKPHOS 104  BILITOT 0.4  ALBUMIN 3.3*    Glucose  Recent Labs Lab 09/11/15 0806 09/11/15 1138 09/11/15 1523 09/11/15 1938 09/11/15 2342 09/12/15 0351  GLUCAP  176* 122* 133* 153* 149* 183*    Imaging Dg Chest Port 1 View  09/12/2015  CLINICAL DATA:  Respiratory failure, while op with a, history of COPD, former smoker. EXAM: PORTABLE CHEST 1 VIEW COMPARISON:  Portable chest x-ray of September 11, 2015 FINDINGS: The lungs are reasonably well inflated. There is hazy increased density at both lung bases more conspicuous today. Traces of pleural fluid blunt the costophrenic angles. The endotracheal tube tip lies approximately 4.5 cm above the carina. The esophagogastric tube tip projects below the inferior margin of the image. The heart is normal in size. The pulmonary vascularity is not engorged. IMPRESSION: Slight interval increase in bibasilar atelectasis. A trace of pleural fluid is suspected especially on the right. The support tubes are in reasonable position. Electronically Signed   By: David  SwazilandJordan M.D.   On: 09/12/2015 07:46   Dg Abd Portable 1v  09/11/2015  CLINICAL DATA:  Z93.1 (ICD-10-CM) - Feeding by G-tube (HCC) EXAM: PORTABLE ABDOMEN - 1 VIEW COMPARISON:  None. FINDINGS: Nasogastric tube has been placed, tip overlying the level of the distal stomach. Bowel gas pattern is nonobstructed. IMPRESSION: Nasogastric tube tip overlying the distal stomach. Electronically Signed   By: Norva PavlovElizabeth  Brown M.D.   On:  09/11/2015 10:00     STUDIES:   CULTURES:  ANTIBIOTICS:  SIGNIFICANT EVENTS: 3/01 Admit C3-4 decompression and fusion >> no cuff leak so left with ETT 3/02 No cuff leak  LINES/TUBES: 3/01 ETT >>   DISCUSSION: 65 yo female former smoker with upper extremity numbness and pain from cervical myelopathy.  She had anterior C3-4 decompression and fusion, and posterior cervical fusion with foraminotomy on 3/01.  She remained on vent post-op due to concern for upper airway edema.  She has PMhx of HTN, HLD, DM, COPD, Vertigo, Anxiety/depression  ASSESSMENT / PLAN:  PULMONARY A: Compromised airway. Hx of COPD. P:   Pressure support wean as  tolerated >> might be ready for extubation soon Continue decadron for upper airway edema Brovana, pulmicort with prn albuterol F/u CXR  CARDIOVASCULAR A:  Hx of HTN, HLD. P:  Continue lipitor Hold outpt ASA, lisinopril for now  RENAL A:   No acute issues. P:   Monitor renal fx, urine outpt  GASTROINTESTINAL A:   Nutrition. P:   Tube feeds while on vent Protonix for SUP  HEMATOLOGIC A:   Anemia. P:  F/u CBC SCDs for DVT prevention >> add lovenox when okay with ortho  INFECTIOUS A:   No evidence for infection. P:   Monitor clinically  ENDOCRINE A:   Steroid induced hyperglycemia.   P:   SSI while on decadron  NEUROLOGIC A:   Cervical myelopathy s/p C3-4 decompression with fusion. P:   RASS goal: 0 to -1 Post op care per orthopedics  DERMATOLOGY A: Rash developed 3/02. P: Prn benadryl  CC time 31 minutes.  Coralyn Helling, MD [facility] Pulmonary/Critical Care 09/12/2015, 8:31 AM Pager:  [phone] After 3pm call: [phone]

## 2015-09-12 NOTE — Progress Notes (Signed)
Rehab Admissions Coordinator Note:  Patient was screened by Trish MageLogue, Tilly Pernice M for appropriateness for an Inpatient Acute Rehab Consult.  At this time, a rehab consult has been ordered and is pending completion.  We will follow up once consult is completed.  Lelon FrohlichLogue, Bilal Manzer M 09/12/2015, 3:02 PM  I can be reached at [phone].

## 2015-09-12 NOTE — Progress Notes (Signed)
    Subjective: Procedure(s) (LRB): ANTERIOR C3-4 DECOMPRESSION AND FUSION (N/A) POSTERIOR CERVICAL FUSION/FORAMINOTOMY Cervical 3-4 (N/A) 2 Days Post-Op  Patient reports pain as 3 on 0-10 scale.  Reports decreased arm pain reports incisional neck pain   Negative void Negative bowel movement Positive flatus Negative chest pain or shortness of breath  Objective: Vital signs in last 24 hours: Temp:  [97.8 F (36.6 C)-101 F (38.3 C)] 97.8 F (36.6 C) (03/03 1100) Pulse Rate:  [82-111] 83 (03/03 1200) Resp:  [14-23] 19 (03/03 1200) BP: (100-168)/(62-108) 142/88 mmHg (03/03 1200) SpO2:  [100 %] 100 % (03/03 1200) FiO2 (%):  [40 %] 40 % (03/03 0831)  Intake/Output from previous day: 03/02 0701 - 03/03 0700 In: 7307 [I.V.:6462.6; NG/GT:844.3] Out: 2685 [Urine:2685]  Labs:  Recent Labs  09/11/15 0320 09/12/15 0250  WBC 9.3 10.3  RBC 3.73* 3.74*  HCT 32.5* 32.7*  PLT 262 310    Recent Labs  09/11/15 0320 09/12/15 0250  NA 140 141  K 4.4 4.1  CL 109 110  CO2 19* 20*  BUN 22* 29*  CREATININE 1.14* 0.98  GLUCOSE 216* 180*  CALCIUM 8.9 9.0   No results for input(s): LABPT, INR in the last 72 hours.  Physical Exam: ABD soft Intact pulses distally Incision: dressing C/D/I Compartment soft  Assessment/Plan: Patient stable  xrays satisfactory Mobilization with physical therapy Encourage incentive spirometry Continue care  Advance diet Up with therapy  If stable can transfer to [facility] in the AM   Venita Lickahari Jaquarius Seder, MD [facility] [phone]

## 2015-09-12 NOTE — Procedures (Signed)
Extubation Procedure Note  Patient Details:   Name: Lindsey Melton DOB: 27-Aug-1950 MRN: [id number]   Airway Documentation:   Patient extubated after having an audible cuff leak per MD order with no complications. Patient was extubated to a 3LNC. BS are equal with no stridor present.   Evaluation  O2 sats: stable throughout Complications: No apparent complications Patient did tolerate procedure well. Bilateral Breath Sounds: Diminished, Clear, Rhonchi Suctioning: Airway Yes  Libby MawBrittney M Khyli Swaim 09/12/2015, 9:41 AM

## 2015-09-12 NOTE — Evaluation (Signed)
Physical Therapy Evaluation Patient Details Name: Lindsey Melton MRN: [id number] DOB: [birth year] Today's Date: 09/12/2015   History of Present Illness  Pt admitted for ACDF C3-4. Intubated 3/1-3/3. PMH: COPD, HTN, HLD, vertigo, anxiety and depression.  Clinical Impression  Patient presents with decreased strength, balance, impaired cognition and emotionally labile s/p above surgery impacting safe mobility. Prior to November, pt independent and active. Pt with gradual decline since mid January requiring assist for ambulation and ADLs. Highly motivated to regain functional independence. Good CIR candidate. Will follow acutely to maximize independence and mobility.     Follow Up Recommendations CIR;Supervision/Assistance - 24 hour    Equipment Recommendations  None recommended by PT    Recommendations for Other Services Rehab consult     Precautions / Restrictions Precautions Precautions: Fall;Cervical Required Braces or Orthoses: Cervical Brace Cervical Brace: Hard collar (when OOB) Restrictions Weight Bearing Restrictions: No      Mobility  Bed Mobility Overal bed mobility: +2 for physical assistance;Needs Assistance Bed Mobility: Rolling;Sidelying to Sit Rolling: +2 for physical assistance;Min guard Sidelying to sit: +2 for physical assistance;Min assist       General bed mobility comments: instructed in log roll technique, pt very eager to get OOB.   Transfers Overall transfer level: Needs assistance Equipment used: 2 person hand held assist Transfers: Sit to/from [employer] Sit to Stand: +2 physical assistance;Min assist Stand pivot transfers: Min assist;+2 physical assistance       General transfer comment: assist to rise and for balance. SPT bed to chair with MIn A for lines/balance.  Ambulation/Gait                Stairs            Wheelchair Mobility    Modified Rankin (Stroke Patients Only)       Balance Overall balance  assessment: Needs assistance Sitting-balance support: Feet supported;No upper extremity supported Sitting balance-Leahy Scale: Fair     Standing balance support: During functional activity Standing balance-Leahy Scale: Poor Standing balance comment: Reliant on UEs for support in static and dynamic standing.                             Pertinent Vitals/Pain Pain Assessment: Faces Pain Score: 4  Faces Pain Scale: Hurts little more Pain Location: head Pain Descriptors / Indicators: Aching Pain Intervention(s): Patient requesting pain meds-RN notified    Home Living Family/patient expects to be discharged to:: Private residence Living Arrangements: Children (daughter) Available Help at Discharge: Family;Available PRN/intermittently Type of Home: House Home Access: Stairs to enter   [employer] of Steps: 2 (Church builing a ramp) Home Layout: One level Home Equipment: Environmental consultant - 2 wheels;Cane - single point;Shower seat      Prior Function Level of Independence: Needs assistance   Gait / Transfers Assistance Needed: pt's daughter was holding pt up under her shoulders from behind to assist her in walking   ADL's / Homemaking Assistance Needed: assist for bathing, dressing, toileting, could self feed and perform grooming, total assist for IADL        Hand Dominance   Dominant Hand: Right    Extremity/Trunk Assessment   Upper Extremity Assessment: Defer to OT evaluation           Lower Extremity Assessment: Generalized weakness (Grossly ~3/5 throughout BLEs.)         Communication   Communication: No difficulties  Cognition Arousal/Alertness: Awake/alert Behavior During Therapy: Impulsive (emotionally  labile) Overall Cognitive Status: Impaired/Different from baseline Area of Impairment: Orientation;Attention;Memory;Safety/judgement;Problem solving Orientation Level: Disoriented to;Time;Situation Current Attention Level: Sustained Memory:  Decreased short-term memory   Safety/Judgement: Decreased awareness of safety;Decreased awareness of deficits   Problem Solving: Slow processing;Difficulty sequencing;Requires verbal cues General Comments: Pt with difficulty finishing sentences and thought processes.Daughter reports this is not new.    General Comments General comments (skin integrity, edema, etc.): Daughter present during session.    Exercises        Assessment/Plan    PT Assessment Patient needs continued PT services  PT Diagnosis Altered mental status;Generalized weakness   PT Problem List Decreased strength;Pain;Cardiopulmonary status limiting activity;Decreased cognition;Decreased activity tolerance;Decreased knowledge of precautions;Decreased mobility;Decreased balance;Decreased safety awareness  PT Treatment Interventions Balance training;Gait training;Functional mobility training;Therapeutic activities;Therapeutic exercise;Patient/family education;Stair training   PT Goals (Current goals can be found in the Care Plan section) Acute Rehab PT Goals Patient Stated Goal: to walk PT Goal Formulation: With patient Time For Goal Achievement: 09/26/15 Potential to Achieve Goals: Good    Frequency Min 4X/week   Barriers to discharge        Co-evaluation PT/OT/SLP Co-Evaluation/Treatment: Yes Reason for Co-Treatment: Necessary to address cognition/behavior during functional activity;For patient/therapist safety PT goals addressed during session: Mobility/safety with mobility;Strengthening/ROM OT goals addressed during session: ADL's and self-care       End of Session Equipment Utilized During Treatment: Oxygen;Cervical collar Activity Tolerance: Patient tolerated treatment well Patient left: in chair;with call bell/phone within reach Nurse Communication: Mobility status;Patient requests pain meds         Time: 1334-1406 PT Time Calculation (min) (ACUTE ONLY): 32 min   Charges:   PT  Evaluation $PT Eval Moderate Complexity: 1 Procedure     PT G Codes:        Krystal Delduca A Alaiyah Bollman 09/12/2015, 2:58 PM  Mylo RedShauna Calem Cocozza, PT, DPT [phone]

## 2015-09-13 DIAGNOSIS — J9600 Acute respiratory failure, unspecified whether with hypoxia or hypercapnia: Secondary | ICD-10-CM

## 2015-09-13 LAB — GLUCOSE, CAPILLARY
Glucose-Capillary: 107 mg/dL — ABNORMAL HIGH (ref 65–99)
Glucose-Capillary: 109 mg/dL — ABNORMAL HIGH (ref 65–99)
Glucose-Capillary: 114 mg/dL — ABNORMAL HIGH (ref 65–99)
Glucose-Capillary: 124 mg/dL — ABNORMAL HIGH (ref 65–99)
Glucose-Capillary: 129 mg/dL — ABNORMAL HIGH (ref 65–99)
Glucose-Capillary: 84 mg/dL (ref 65–99)

## 2015-09-13 MED ORDER — DEXAMETHASONE SODIUM PHOSPHATE 4 MG/ML IJ SOLN
4.0000 mg | Freq: Two times a day (BID) | INTRAMUSCULAR | Status: DC
  Administered 2015-09-13 – 2015-09-14 (×3): 4 mg via INTRAVENOUS
  Filled 2015-09-13 (×3): qty 1

## 2015-09-13 MED ORDER — MEPERIDINE HCL 25 MG/ML IJ SOLN: 6.2500 mg | INTRAMUSCULAR | Status: DC | PRN

## 2015-09-13 MED ORDER — HYDROMORPHONE HCL 1 MG/ML IJ SOLN
0.2500 mg | INTRAMUSCULAR | Status: DC | PRN
  Administered 2015-09-14 (×2): 0.5 mg via INTRAVENOUS
  Filled 2015-09-13 (×2): qty 1

## 2015-09-13 MED ORDER — DEXAMETHASONE 4 MG PO TABS
4.0000 mg | ORAL_TABLET | Freq: Two times a day (BID) | ORAL | Status: DC
  Filled 2015-09-13: qty 1

## 2015-09-13 MED ORDER — ONDANSETRON HCL 4 MG/2ML IJ SOLN: 4.0000 mg | Freq: Once | INTRAMUSCULAR | Status: DC | PRN

## 2015-09-13 MED ORDER — SODIUM CHLORIDE 0.45 % IV SOLN
INTRAVENOUS | Status: DC
  Administered 2015-09-13: 12:00:00 via INTRAVENOUS

## 2015-09-13 NOTE — Progress Notes (Signed)
[facility]. Patient sister by the bedside.

## 2015-09-13 NOTE — Progress Notes (Signed)
Pt arrived to the unit via wheelchair as a transfer from 2s with sister and belongings to the side. Pt A&O x4; cervical collar on and aligned; pt oriented to the unit and room; IV intact and transfusing; skin intact except surgical incisions on neck which are clean, dry and intact with no active bleeding noted. Pt educated on fall and safety precautions/prevention. Pt sitting up in chair with alarm on, call light within reach and sister at side. Will closely monitor pt. Dionne BucyP. Amo Santresa Levett RN

## 2015-09-13 NOTE — Progress Notes (Signed)
PULMONARY / CRITICAL CARE MEDICINE   Name: Nelia Rogoff MRN: [id number] DOB: [birth year]    ADMISSION DATE:  09/10/2015 CONSULTATION DATE:  09/10/2015  REFERRING MD:  Dr. Shon Baton  CHIEF COMPLAINT:  Neck pain  SUBJECTIVE:  extubated, no distress  VITAL SIGNS: BP 146/79 mmHg  Pulse 69  Temp(Src) 97.5 F (36.4 C) (Oral)  Resp 17  Ht  (1.499 m)  Wt 66.5 kg (146 lb 9.7 oz)  BMI 29.60 kg/m2  SpO2 100%  VENTILATOR SETTINGS:    INTAKE / OUTPUT: I/O last 3 completed shifts: In: 2600.6 [I.V.:2000.6; NG/GT:600] Out: 2690 [Urine:2690]  PHYSICAL EXAMINATION: General:  Follows commands Neuro:  RASS 0, moves extremities, collar in place HEENT:  C collar in place Cardiovascular:   s1 s2  Regular, no murmur Lungs: CTA, some reduced bases Abdomen:  Soft, non tender, no r Musculoskeletal:  No edema Skin:  Faint, red rash on arms b/l  LABS:  BMET  Recent Labs Lab 09/10/15 2140 09/11/15 0320 09/12/15 0250  NA 143 140 141  K 4.4 4.4 4.1  CL 109 109 110  CO2 21* 19* 20*  BUN 24* 22* 29*  CREATININE 1.17* 1.14* 0.98  GLUCOSE 196* 216* 180*    Electrolytes  Recent Labs Lab 09/10/15 2140 09/11/15 0320 09/12/15 0250  CALCIUM 9.3 8.9 9.0  MG  --  1.7  --   PHOS  --  4.5  --     CBC  Recent Labs Lab 09/10/15 2140 09/11/15 0320 09/12/15 0250  WBC 9.3 9.3 10.3  HGB 11.4* 10.7* 10.7*  HCT 35.5* 32.5* 32.7*  PLT 268 262 310    ABG  Recent Labs Lab 09/10/15 2102  PHART 7.230*  PCO2ART 50.8*  PO2ART 105.0*    Liver Enzymes  Recent Labs Lab 09/10/15 2140  AST 23  ALT 14  ALKPHOS 104  BILITOT 0.4  ALBUMIN 3.3*    Glucose  Recent Labs Lab 09/12/15 1200 09/12/15 1637 09/12/15 2010 09/12/15 2341 09/13/15 0416 09/13/15 0824  GLUCAP 143* 114* 143* 124* 107* 129*    Imaging No results found.   STUDIES:   CULTURES:  ANTIBIOTICS:  SIGNIFICANT EVENTS: 3/01 Admit C3-4 decompression and fusion >> no cuff leak so left with ETT 3/02  No cuff leak  LINES/TUBES: 3/01 ETT >> 3/3  DISCUSSION: 65 yo female former smoker with upper extremity numbness and pain from cervical myelopathy.  She had anterior C3-4 decompression and fusion, and posterior cervical fusion with foraminotomy on 3/01.  She remained on vent post-op due to concern for upper airway edema.  She has PMhx of HTN, HLD, DM, COPD, Vertigo, Anxiety/depression  ASSESSMENT / PLAN:  PULMONARY A: Compromised airway. Hx of COPD. P:   Flutter, IS upright Continue decadron consider redcution Brovana, pulmicort with prn albuterol F/u CXR not needed , unless declines  CARDIOVASCULAR A:  Hx of HTN, HLD. P:  Continue lipitor Hold outpt ASA, lisinopril for now Consider dc tele  RENAL A:   Hypercloremia P:   Avoid saline kvo when eating  GASTROINTESTINAL A:   Nutrition, dysphagia P:   Protonix for SUP - consider dc slp following  HEMATOLOGIC A:   Anemia. P:  F/u CBC SCDs for DVT prevention >> add lovenox when okay with ortho  INFECTIOUS A:   No evidence for infection. P:   Monitor clinically for temps  ENDOCRINE A:   Steroid induced hyperglycemia.   P:   SSI while on decadron Reduce roidss light  NEUROLOGIC A:  Cervical myelopathy s/p C3-4 decompression with fusion. P:   RASS goal: 0 to -1 Post op care per orthopedics  To [facility], med floor  Mcarthur RossettiDaniel J. Tyson AliasFeinstein, MD, FACP Pgr: [phone] [facility]

## 2015-09-13 NOTE — Evaluation (Signed)
Clinical/Bedside Swallow Evaluation Patient Details  Name: Lindsey Melton MRN: [id number] Date of Birth: 03/03/1951  Today's Date: 09/13/2015 Time: SLP Start Time (ACUTE ONLY): 4782 SLP Stop Time (ACUTE ONLY): 0943 SLP Time Calculation (min) (ACUTE ONLY): 14 min  Past Medical History:  Past Medical History  Diagnosis Date  . Hypertension   . Diabetes mellitus   . Asthma   . COPD (chronic obstructive pulmonary disease) (HCC)   . Peripheral vertigo   . Headache   . Anxiety   . Arthritis   . Depression   . Hyperlipidemia   . Neuromuscular disorder (HCC)   . Substance abuse   . Wears glasses   . Kidney stones   . History of hiatal hernia   . Cervical myelopathy (HCC)   . Cataract    Past Surgical History:  Past Surgical History  Procedure Laterality Date  . Abdominal hysterectomy    . Appendectomy    . Anterior cervical decomp/discectomy fusion N/A 09/10/2015    Procedure: ANTERIOR C3-4 DECOMPRESSION AND FUSION;  Surgeon: Venita Lick, MD;  Location: [facility] OR;  Service: Orthopedics;  Laterality: N/A;  . Posterior cervical fusion/foraminotomy N/A 09/10/2015    Procedure: POSTERIOR CERVICAL FUSION/FORAMINOTOMY Cervical 3-4;  Surgeon: Venita Lick, MD;  Location: [facility] OR;  Service: Orthopedics;  Laterality: N/A;   HPI:  Pt admitted for ACDF C3-4. Intubated 3/1-3/3 due to upper airway edema. PMH: COPD, HTN, HLD, vertigo, anxiety and depression.   Assessment / Plan / Recommendation Clinical Impression  Patient presents with a moderate-severe pharyngeal dysphagia likely secondary to post op and post intubation edema resulting in decreased airway protection across consistencies assessed. Hopeful for acute recovery as swelling decreases. At this time however, patient not safe for a full po diet. Recommend NPO except ice chips after oral care. Will f/u next date to assess progress. Suspect that patient will benefit from instrumental testing to determine least restrictive diet when beginning to  shows signs of improvement at bedside.     Aspiration Risk  Severe aspiration risk    Diet Recommendation NPO;Ice chips PRN after oral care   Medication Administration: Via alternative means    Other  Recommendations Oral Care Recommendations: Oral care QID;Oral care prior to ice chip/H20   Follow up Recommendations  Inpatient Rehab    Frequency and Duration min 3x week  2 weeks       Prognosis Prognosis for Safe Diet Advancement: Good      Swallow Study   General HPI: Pt admitted for ACDF C3-4. Intubated 3/1-3/3 due to upper airway edema. PMH: COPD, HTN, HLD, vertigo, anxiety and depression. Type of Study: Bedside Swallow Evaluation Previous Swallow Assessment: none noted Diet Prior to this Study: NPO Temperature Spikes Noted: No Respiratory Status: Room air History of Recent Intubation: Yes Length of Intubations (days): 2 days Date extubated: 09/12/15 Behavior/Cognition: Alert;Cooperative;Pleasant mood Oral Cavity Assessment: Within Functional Limits Oral Care Completed by SLP: Recent completion by staff Oral Cavity - Dentition: Adequate natural dentition Vision: Functional for self-feeding Self-Feeding Abilities: Able to feed self Patient Positioning: Upright in chair Baseline Vocal Quality: Normal Volitional Cough: Strong Volitional Swallow: Able to elicit    Oral/Motor/Sensory Function Overall Oral Motor/Sensory Function: Within functional limits   Ice Chips Ice chips: Impaired Presentation: Spoon Pharyngeal Phase Impairments: Multiple swallows;Throat Clearing - Immediate;Throat Clearing - Delayed;Cough - Delayed;Cough - Immediate   Thin Liquid Thin Liquid: Impaired Presentation: Cup;Self Fed Pharyngeal  Phase Impairments: Multiple swallows;Throat Clearing - Immediate;Throat Clearing - Delayed;Cough - Immediate;Cough - Delayed  Nectar Thick Nectar Thick Liquid: Not tested   Honey Thick Honey Thick Liquid: Not tested   Puree Puree: Not tested    Solid   GO  Lindsey Chiles MA, CCC-SLP [phone]  Solid: Not tested        Lindsey Melton Lindsey Melton 09/13/2015,9:47 AM

## 2015-09-13 NOTE — Progress Notes (Signed)
Subjective: 3 Days Post-Op Procedure(s) (LRB): ANTERIOR C3-4 DECOMPRESSION AND FUSION (N/A) POSTERIOR CERVICAL FUSION/FORAMINOTOMY Cervical 3-4 (N/A) Patient reports pain as 3 on 0-10 scale.    Objective: Vital signs in last 24 hours: Temp:  [97.1 F (36.2 C)-98.3 F (36.8 C)] 97.5 F (36.4 C) (03/04 0827) Pulse Rate:  [58-87] 69 (03/04 0900) Resp:  [11-22] 17 (03/04 0900) BP: (122-157)/(72-107) 146/79 mmHg (03/04 0900) SpO2:  [92 %-100 %] 100 % (03/04 0900)  Intake/Output from previous day: 03/03 0701 - 03/04 0700 In: 1486.8 [I.V.:1216.8; NG/GT:270] Out: 1975 [Urine:1975] Intake/Output this shift: Total I/O In: 100 [I.V.:100] Out: 300 [Urine:300]   Recent Labs  09/10/15 2140 09/11/15 0320 09/12/15 0250  HGB 11.4* 10.7* 10.7*    Recent Labs  09/11/15 0320 09/12/15 0250  WBC 9.3 10.3  RBC 3.73* 3.74*  HCT 32.5* 32.7*  PLT 262 310    Recent Labs  09/11/15 0320 09/12/15 0250  NA 140 141  K 4.4 4.1  CL 109 110  CO2 19* 20*  BUN 22* 29*  CREATININE 1.14* 0.98  GLUCOSE 216* 180*  CALCIUM 8.9 9.0   No results for input(s): LABPT, INR in the last 72 hours.  Incision: dressing C/D/I Neuro exam global weakness UE/ LE 4/5 No clonus No DVT  Assessment/Plan: 3 Days Post-Op Procedure(s) (LRB): ANTERIOR C3-4 DECOMPRESSION AND FUSION (N/A) POSTERIOR CERVICAL FUSION/FORAMINOTOMY Cervical 3-4 (N/A) Advance diet Transfer to floor per Dr. Lajean SaverBrooks  Brandy Kabat C 09/13/2015, 11:51 AM

## 2015-09-14 LAB — GLUCOSE, CAPILLARY
Glucose-Capillary: 120 mg/dL — ABNORMAL HIGH (ref 65–99)
Glucose-Capillary: 131 mg/dL — ABNORMAL HIGH (ref 65–99)
Glucose-Capillary: 156 mg/dL — ABNORMAL HIGH (ref 65–99)
Glucose-Capillary: 97 mg/dL (ref 65–99)
Glucose-Capillary: 98 mg/dL (ref 65–99)
Glucose-Capillary: 99 mg/dL (ref 65–99)

## 2015-09-14 LAB — BASIC METABOLIC PANEL
Anion gap: 9 (ref 5–15)
BUN: 26 mg/dL — ABNORMAL HIGH (ref 6–20)
CO2: 19 mmol/L — ABNORMAL LOW (ref 22–32)
Calcium: 8.8 mg/dL — ABNORMAL LOW (ref 8.9–10.3)
Chloride: 113 mmol/L — ABNORMAL HIGH (ref 101–111)
Creatinine, Ser: 0.76 mg/dL (ref 0.44–1.00)
GFR calc Af Amer: >60 mL/min (ref >60)
GFR calc non Af Amer: >60 mL/min (ref >60)
Glucose, Bld: 166 mg/dL — ABNORMAL HIGH (ref 65–99)
Potassium: 3.9 mmol/L (ref 3.5–5.1)
Sodium: 141 mmol/L (ref 135–145)

## 2015-09-14 MED ORDER — HYDRALAZINE HCL 20 MG/ML IJ SOLN: 10.0000 mg | INTRAMUSCULAR | Status: DC | PRN

## 2015-09-14 MED ORDER — ENALAPRILAT 1.25 MG/ML IV SOLN
0.6250 mg | Freq: Four times a day (QID) | INTRAVENOUS | Status: DC
  Administered 2015-09-14 – 2015-09-18 (×17): 0.625 mg via INTRAVENOUS
  Filled 2015-09-14 (×20): qty 0.5

## 2015-09-14 NOTE — Progress Notes (Signed)
Subjective: 4 Days Post-Op Procedure(s) (LRB): ANTERIOR C3-4 DECOMPRESSION AND FUSION (N/A) POSTERIOR CERVICAL FUSION/FORAMINOTOMY Cervical 3-4 (N/A) Patient reports pain as well controlled.  Doing well on the Floor. Reports a good night. Progressing with PT.  Objective: Vital signs in last 24 hours: Temp:  [97.5 F (36.4 C)-98.4 F (36.9 C)] 97.6 F (36.4 C) (03/05 0523) Pulse Rate:  [58-78] 78 (03/05 0523) Resp:  [16-23] 16 (03/05 0523) BP: (150-167)/(80-96) 167/96 mmHg (03/05 0523) SpO2:  [98 %-100 %] 98 % (03/05 0523)  Intake/Output from previous day: 03/04 0701 - 03/05 0700 In: 443.3 [I.V.:443.3] Out: 300 [Urine:300] Intake/Output this shift:     Recent Labs  09/12/15 0250  HGB 10.7*    Recent Labs  09/12/15 0250  WBC 10.3  RBC 3.74*  HCT 32.7*  PLT 310    Recent Labs  09/12/15 0250 09/14/15 0416  NA 141 141  K 4.1 3.9  CL 110 113*  CO2 20* 19*  BUN 29* 26*  CREATININE 0.98 0.76  GLUCOSE 180* 166*  CALCIUM 9.0 8.8*   No results for input(s): LABPT, INR in the last 72 hours.  Alert and oriented x3. RRR, Lungs clear, BS x4. Calf soft and non tender. Dressing C/D/I. No DVT signs. No signs of infection or compartment syndrome. Bilateral LE grossly neurovascularly intact.   Assessment/Plan: 4 Days Post-Op Procedure(s) (LRB): ANTERIOR C3-4 DECOMPRESSION AND FUSION (N/A) POSTERIOR CERVICAL FUSION/FORAMINOTOMY Cervical 3-4 (N/A) Up with PT Continue care Doing well.   Lindsey Melton L 09/14/2015, 10:39 AM

## 2015-09-14 NOTE — Progress Notes (Signed)
Speech Language Pathology Treatment: Dysphagia  Patient Details Name: Lindsey Melton MRN: [id number] DOB: 1950/12/17 Today's Date: 09/14/2015 Time: [id number] SLP Time Calculation (min) (ACUTE ONLY): 12 min  Assessment / Plan / Recommendation Clinical Impression  ST follow up for possible initiation of a diet.  The patient reported that she did not feel that her swallowing had improved since yesterday.  The patient continued to present with a moderate-severe pharyngeal dysphagia with poor airway protection.  The patient was presented with an ice chip which led to immediate throat clear and multiple swallows to attempt to clear the bolus.  Vocal quality remained impaired and the patient had a weak throat clear/cough.  Given current results the patient is not ready for objective swallowing evaluation.  Recommend continue with STRICT NPO with meds given via alternative means.  ST will follow up tomorrow for PO readiness.     HPI HPI: Pt admitted for ACDF C3-4. Intubated 3/1-3/3 due to upper airway edema. PMH: COPD, HTN, HLD, vertigo, anxiety and depression.      SLP Plan  Continue with current plan of care     Recommendations  Diet recommendations: NPO (STRICT) Medication Administration: Via alternative means            Oral Care Recommendations: Oral care QID;Oral care prior to ice chip/H20 Follow up Recommendations: Inpatient Rehab Plan: Continue with current plan of care     GO               Dimas AguasMelissa Juletta Berhe, MA, CCC-SLP Acute Rehab SLP [phone]

## 2015-09-14 NOTE — Progress Notes (Signed)
[facility] PROGRESS NOTE  Galit Urich ZOX:[id number] DOB: 1950-09-20 DOA: 09/10/2015 PCP: Kevin Fenton, MD  HPI/Brief narrative [phone] with hx of COPD, HTN, DM, COPD who presented with cervical myelopathy. Patient underwent anterior C3- 4 decompression and fusion. Patient remained vent-dependent post-operatively  Assessment/Plan: 1. Possible upper airway edema 1. Required prolonged intubation post-operatively 2. Currently remains stable on RA 3. Continued on q12hr decadron since 3/1, consider wean soon 2. COPD 1. Stable at present 2. No wheezing currently 3. HTN 1. BP currently poorly controlled, likely secondary to being NPO and not taking PO meds 2. Chart reviewed. Lisinopril was held initially secondary to hypotension 3. Will resume IV ACEI while NPO 4. Hydralazine PRN for  sbp>160 4. DM2 1. On SSI coverage 2. Stable at present 5. HLD 1. On statin prior to admission 2. Would resume when able to tolerate PO 6. Dysphagia 1. SLP following. Continue NPO 2. IV meds as needed 7. Cervical myelopathy s/p C3-4 decompression 1. S/p surgery on 3/1 2. Orthopedic Surgery following 8. DVT prophylaxis 1. SCD's  Code Status: Full Family Communication: Pt in room Disposition Plan: Unclear at this time. CIR pending   Consultants:  PCCM  Orthopedic Surgery  Procedures:  C3-4 Anterior decompression and fusion, posterior C3-4 cervical fusion/foraminotomy  Antibiotics: Anti-infectives    Start     Dose/Rate Route Frequency Ordered Stop   09/10/15 2200  ceFAZolin (ANCEF) IVPB 1 g/50 mL premix     1 g 100 mL/hr over 30 Minutes Intravenous Every 8 hours 09/10/15 1959 09/11/15 0545   09/10/15 0945  ceFAZolin (ANCEF) IVPB 2 g/50 mL premix     2 g 100 mL/hr over 30 Minutes Intravenous To [facility]Stay [facility] 09/10/15 0939 09/10/15 1603   09/10/15 0942  ceFAZolin (ANCEF) 2-3 GM-% IVPB SOLR    Comments:  Scronce, Trina   : cabinet override      09/10/15 0942 09/10/15 2159       HPI/Subjective: Eager to go home  Objective: Filed Vitals:   09/13/15 2039 09/13/15 2054 09/14/15 0523 09/14/15 1407  BP: 161/93  167/96 177/96  Pulse: 58 68 78 71  Temp: 97.5 F (36.4 C)  97.6 F (36.4 C) 97.5 F (36.4 C)  TempSrc: Oral  Oral Oral  Resp: Height:      Weight:      SpO2: 99% 98% 98% 99%    Intake/Output Summary (Last 24 hours) at 09/14/15 1432 Last data filed at 09/13/15 1900  Gross per 24 hour  Intake 343.33 ml  Output      0 ml  Net 343.33 ml   Filed Weights   09/10/15 0939 09/11/15 0500  Weight: 62.143 kg (137 lb) 66.5 kg (146 lb 9.7 oz)    Exam:   General:  Awake, in nad  Cardiovascular: regular, s1, s2  Respiratory: normal resp effort, no wheezing  Abdomen: soft,nondistended  Musculoskeletal: perfused,no clubbing   Data Reviewed: Basic Metabolic Panel:  Recent Labs Lab 09/10/15 2140 09/11/15 0320 09/12/15 0250 09/14/15 0416  NA 143 140 141 141  K 4.4 4.4 4.1 3.9  CL 109 109 110 113*  CO2 21* 19* 20* 19*  GLUCOSE 196* 216* 180* 166*  BUN 24* 22* 29* 26*  CREATININE 1.17* 1.14* 0.98 0.76  CALCIUM 9.3 8.9 9.0 8.8*  MG  --  1.7  --   --   PHOS  --  4.5  --   --    Liver Function Tests:  Recent Labs Lab  09/10/15 2140  AST 23  ALT 14  ALKPHOS 104  BILITOT 0.4  PROT 5.5*  ALBUMIN 3.3*   No results for input(s): LIPASE, AMYLASE in the last 168 hours. No results for input(s): AMMONIA in the last 168 hours. CBC:  Recent Labs Lab 09/10/15 2140 09/11/15 0320 09/12/15 0250  WBC 9.3 9.3 10.3  HGB 11.4* 10.7* 10.7*  HCT 35.5* 32.5* 32.7*  MCV 88.1 87.1 87.4  PLT 268 262 310   Cardiac Enzymes: No results for input(s): CKTOTAL, CKMB, CKMBINDEX, TROPONINI in the last 168 hours. BNP (last 3 results) No results for input(s): BNP in the last 8760 hours.  ProBNP (last 3 results) No results for input(s): PROBNP in the last 8760 hours.  CBG:  Recent Labs Lab 09/13/15 2043 09/14/15 0039  09/14/15 0525 09/14/15 1020 09/14/15 1140  GLUCAP 84 120* 156* 97 99    No results found for this or any previous visit (from the past 240 hour(s)).   Studies: No results found.  Scheduled Meds: . antiseptic oral rinse  7 mL Mouth Rinse QID  . arformoterol  15 mcg Nebulization BID  . atorvastatin  10 mg Per Tube Daily  . budesonide (PULMICORT) nebulizer solution  0.5 mg Nebulization BID  . chlorhexidine gluconate  15 mL Mouth Rinse BID  . dexamethasone  4 mg Intravenous Q12H   Or  . dexamethasone  4 mg Oral Q12H  . enalaprilat  0.625 mg Intravenous 4 times per day  . insulin aspart  0-15 Units Subcutaneous 6 times per day  . topiramate  100 mg Per Tube BID   Continuous Infusions: . sodium chloride 50 mL/hr at 09/13/15 1208    Active Problems:   Myelopathy (HCC)   Respiratory failure (HCC)   S/P cervical spinal fusion   Surgery, elective   Postprocedural hypotension   CHIU, STEPHEN K  [facility] Pager [phone]. If 7PM-7AM, please contact night-coverage at [email or web address], password [email or web address] 09/14/2015, 2:32 PM  LOS: 4 days

## 2015-09-15 DIAGNOSIS — J449 Chronic obstructive pulmonary disease, unspecified: Secondary | ICD-10-CM | POA: Insufficient documentation

## 2015-09-15 DIAGNOSIS — G8918 Other acute postprocedural pain: Secondary | ICD-10-CM | POA: Insufficient documentation

## 2015-09-15 DIAGNOSIS — D62 Acute posthemorrhagic anemia: Secondary | ICD-10-CM | POA: Insufficient documentation

## 2015-09-15 DIAGNOSIS — R1310 Dysphagia, unspecified: Secondary | ICD-10-CM | POA: Insufficient documentation

## 2015-09-15 DIAGNOSIS — E119 Type 2 diabetes mellitus without complications: Secondary | ICD-10-CM | POA: Insufficient documentation

## 2015-09-15 DIAGNOSIS — E1142 Type 2 diabetes mellitus with diabetic polyneuropathy: Secondary | ICD-10-CM | POA: Insufficient documentation

## 2015-09-15 DIAGNOSIS — I10 Essential (primary) hypertension: Secondary | ICD-10-CM | POA: Insufficient documentation

## 2015-09-15 LAB — GLUCOSE, CAPILLARY
Glucose-Capillary: 110 mg/dL — ABNORMAL HIGH (ref 65–99)
Glucose-Capillary: 147 mg/dL — ABNORMAL HIGH (ref 65–99)
Glucose-Capillary: 106 mg/dL — ABNORMAL HIGH (ref 65–99)
Glucose-Capillary: 144 mg/dL — ABNORMAL HIGH (ref 65–99)
Glucose-Capillary: 54 mg/dL — ABNORMAL LOW (ref 65–99)
Glucose-Capillary: 119 mg/dL — ABNORMAL HIGH (ref 65–99)
Glucose-Capillary: 107 mg/dL — ABNORMAL HIGH (ref 65–99)
Glucose-Capillary: 131 mg/dL — ABNORMAL HIGH (ref 65–99)

## 2015-09-15 MED ORDER — DEXTROSE 50 % IV SOLN
INTRAVENOUS | Status: AC
  Administered 2015-09-15: 50 mL
  Filled 2015-09-15: qty 50

## 2015-09-15 MED ORDER — OXYCODONE HCL 5 MG PO TABS
10.0000 mg | ORAL_TABLET | Freq: Four times a day (QID) | ORAL | Status: DC | PRN
  Administered 2015-09-16: 10 mg via ORAL
  Filled 2015-09-15 (×2): qty 2

## 2015-09-15 MED ORDER — MORPHINE SULFATE (PF) 2 MG/ML IV SOLN
2.0000 mg | INTRAVENOUS | Status: DC | PRN
  Filled 2015-09-15: qty 1

## 2015-09-15 MED ORDER — DEXTROSE-NACL 5-0.9 % IV SOLN
INTRAVENOUS | Status: DC
  Administered 2015-09-15: 12:00:00 via INTRAVENOUS

## 2015-09-15 NOTE — Progress Notes (Signed)
Hypoglycemic Event  CBG:54  Treatment: D50 IV 25 mL  Symptoms: None  Follow-up CBG: Time:1317 CBG Result:144  Possible Reasons for Event: Inadequate meal intake  Comments/MD notified:**no*    Lindsey Melton, Lindsey Melton

## 2015-09-15 NOTE — Progress Notes (Signed)
    Subjective: Procedure(s) (LRB): ANTERIOR C3-4 DECOMPRESSION AND FUSION (N/A) POSTERIOR CERVICAL FUSION/FORAMINOTOMY Cervical 3-4 (N/A) 5 Days Post-Op  Patient reports pain as 3 on 0-10 scale.  Reports decreased arm pain Reports minimal incisional neck pain   Positive void Positive bowel movement Positive flatus Negative chest pain or shortness of breath  Objective: Vital signs in last 24 hours: Temp:  [97.5 F (36.4 C)-97.7 F (36.5 C)] 97.7 F (36.5 C) (03/06 0523) Pulse Rate:  [65-71] 65 (03/06 0523) Resp:  [16] 16 (03/06 0523) BP: (135-177)/(82-99) 135/82 mmHg (03/06 0523) SpO2:  [98 %-100 %] 99 % (03/06 0523)  Intake/Output from previous day: 03/05 0701 - 03/06 0700 In: 120 [P.O.:120] Out: -   Labs: No results for input(s): WBC, RBC, HCT, PLT in the last 72 hours.  Recent Labs  09/14/15 0416  NA 141  K 3.9  CL 113*  CO2 19*  BUN 26*  CREATININE 0.76  GLUCOSE 166*  CALCIUM 8.8*   No results for input(s): LABPT, INR in the last 72 hours.  Physical Exam: ABD soft Intact pulses distally Incision: dressing C/D/I Compartment soft Slight improvement with hand strength and ROM  Assessment/Plan: Patient stable  xrays satisfactory Mobilization with physical therapy Encourage incentive spirometry Continue care  Advance diet Up with therapy  Possible CIR vs HHPT vs SNF   Will determine after PT evaluation  Venita Lickahari Fynn Vanblarcom, MD [facility] [phone]

## 2015-09-15 NOTE — Progress Notes (Signed)
Physical Therapy Treatment Patient Details Name: Lindsey Melton MRN: [id number] DOB: Sep 28, 1950 Today's Date: 09/15/2015    History of Present Illness Pt admitted for ACDF C3-4. Intubated 3/1-3/3. PMH: COPD, HTN, HLD, vertigo, anxiety and depression.    PT Comments    Patient is making good progress with mobility and is eager to participate in PT. Continue to progress as tolerated.   Follow Up Recommendations  CIR;Supervision/Assistance - 24 hour     Equipment Recommendations  None recommended by PT    Recommendations for Other Services Rehab consult     Precautions / Restrictions Precautions Precautions: Fall;Cervical Required Braces or Orthoses: Cervical Brace Cervical Brace: Hard collar (when OOB) Restrictions Weight Bearing Restrictions: No    Mobility  Bed Mobility Overal bed mobility: Needs Assistance Bed Mobility: Rolling;Sit to Sidelying Rolling: Min guard       Sit to sidelying: Min assist General bed mobility comments: cues for technique with assist needed to elevate bilat LE into bed  Transfers Overall transfer level: Needs assistance Equipment used: Rolling walker (2 wheeled) Transfers: Sit to/from Stand Sit to Stand: Min guard;+2 safety/equipment         General transfer comment: min gaurd for safety; from recliner chair and BSC; cues for hand placement and technique; pt steady upon standing but anxious about possibility of falling; needs encouragement  Ambulation/Gait Ambulation/Gait assistance: Min guard;Min assist;+2 safety/equipment Ambulation Distance (Feet): 120 Feet Assistive device: Rolling walker (2 wheeled) Gait Pattern/deviations: Shuffle;Wide base of support     General Gait Details: cues posture, righting eyes, and for bilat step length and facilitation of bilat heel strike with pt tending to shuffle feet; ability to correct for very short distance with cues; pt remains anxious and with guarded movements; L hand repositioned on RW by  therapist X2 ; pt unsteady at times with min A once to regain balance when turning but no significant LOB   Stairs            Wheelchair Mobility    Modified Rankin (Stroke Patients Only)       Balance Overall balance assessment: Needs assistance Sitting-balance support: No upper extremity supported;Feet supported Sitting balance-Leahy Scale: Fair     Standing balance support: Bilateral upper extremity supported Standing balance-Leahy Scale: Poor                      Cognition Arousal/Alertness: Awake/alert Behavior During Therapy: WFL for tasks assessed/performed Overall Cognitive Status: Within Functional Limits for tasks assessed               Problem Solving: Slow processing;Requires verbal cues      Exercises      General Comments General comments (skin integrity, edema, etc.): daughter present for session      Pertinent Vitals/Pain Pain Assessment: Faces Faces Pain Scale: Hurts a little bit Pain Location: back of neck Pain Descriptors / Indicators: Sore Pain Intervention(s): Monitored during session;Premedicated before session;Repositioned    Home Living                      Prior Function            PT Goals (current goals can now be found in the care plan section) Acute Rehab PT Goals Patient Stated Goal: Walk, regain memory Progress towards PT goals: Progressing toward goals    Frequency  Min 4X/week    PT Plan Current plan remains appropriate    Co-evaluation  End of Session Equipment Utilized During Treatment: Gait belt;Cervical collar Activity Tolerance: Patient tolerated treatment well Patient left: with call bell/phone within reach;in bed;with bed alarm set;with family/visitor present     Time: 1401-1431 PT Time Calculation (min) (ACUTE ONLY): 30 min  Charges:  $Gait Training: 8-22 mins $Therapeutic Activity: 8-22 mins                    G Codes:      Skylinn Vialpando R TaylDerek Moundor  Olvin Rohr, PTA Pager: [phone]   09/15/2015, 2:57 PM

## 2015-09-15 NOTE — Consult Note (Signed)
Physical Medicine and Rehabilitation Consult Reason for Consult: Cervical myelopathy Referring Physician: Dr. Shon Baton  HPI: Lindsey Melton is a 65 y.o. right handed female with history of hypertension, diabetes mellitus peripheral neuropathy, COPD.  Patient lives with her daughter in [place]. Daughter has 2 children and works during the day.  NO 24/7 support at discharge. Used a cane and walker prior to admission, however, needed assistance with all ADLs and would wait for her daughter to come home to take her to the restroom. One level home with 2 steps to entry. Presented 09/10/2015 with progressive neck pain, tingling and numbness to her hands and upper extremity weakness as well as headaches. X-rays and imaging revealed severe cervical spine stenosis with myelopathy C3-4. Underwent anterior cervical discectomy with fusion. Posterior C3-for decompression and posterior C3-4 instrumented fusion 09/10/2015 per Dr. Shon Baton. Hospital course pain management. Acute blood loss anemia 10.7 and monitor. Decadron protocol completed. Patient with upper airway edema postoperatively required intubation for a short time. Bouts of hypotension with lisinopril held. Physical and occupational therapy evaluations completed with recommendations of physical medicine rehabilitation consult.   ROS Constitutional: Negative for fever and chills.  HENT: Negative for hearing loss.  Eyes: Negative for blurred vision and double vision.  Respiratory: Negative for cough.   Shortness of breath with heavy exertion  Cardiovascular: Negative for chest pain and palpitations.  Gastrointestinal: Positive for constipation. Negative for nausea and vomiting.  Genitourinary: Negative for dysuria and hematuria.  Musculoskeletal: Positive for myalgias, joint pain and neck pain.  Skin: Negative for rash.  Neurological: Positive for dizziness, tingling, weakness and headaches. Negative for seizures and loss of  consciousness.  Psychiatric/Behavioral: Positive for depression.   Anxiety  All other systems reviewed and are negative  Past Medical History  Diagnosis Date  . Hypertension   . Diabetes mellitus   . Asthma   . COPD (chronic obstructive pulmonary disease) (HCC)   . Peripheral vertigo   . Headache   . Anxiety   . Arthritis   . Depression   . Hyperlipidemia   . Neuromuscular disorder (HCC)   . Substance abuse   . Wears glasses   . Kidney stones   . History of hiatal hernia   . Cervical myelopathy (HCC)   . Cataract    Past Surgical History  Procedure Laterality Date  . Abdominal hysterectomy    . Appendectomy    . Anterior cervical decomp/discectomy fusion N/A 09/10/2015    Procedure: ANTERIOR C3-4 DECOMPRESSION AND FUSION;  Surgeon: Venita Lick, MD;  Location: [facility] OR;  Service: Orthopedics;  Laterality: N/A;  . Posterior cervical fusion/foraminotomy N/A 09/10/2015    Procedure: POSTERIOR CERVICAL FUSION/FORAMINOTOMY Cervical 3-4;  Surgeon: Venita Lick, MD;  Location: [facility] OR;  Service: Orthopedics;  Laterality: N/A;   Family History  Problem Relation Age of Onset  . Arthritis Mother   . Diabetes Mother   . Hearing loss Mother   . Heart disease Mother   . Hypertension Mother   . Vision loss Mother   . Stroke Mother   . Asthma Mother   . Heart disease Father   . Depression Daughter   . Mental illness Daughter   . Vision loss Daughter   . Anxiety disorder Daughter   . Alcohol abuse Son   . Drug abuse Son   . Anxiety disorder Son    Social History:  reports that she has quit smoking. Her smoking use included Cigarettes. She has never used smokeless  tobacco. She reports that she drinks alcohol. She reports that she uses illicit drugs ("Crack" cocaine). Allergies:  Allergies  Allergen Reactions  . Prozac [Fluoxetine Hcl] Itching and Other (See Comments)    REACTION: Jaundice--shut down liver function   Medications Prior to Admission  Medication Sig Dispense  Refill  . albuterol (PROVENTIL HFA;VENTOLIN HFA) 108 (90 Base) MCG/ACT inhaler Inhale 2 puffs into the lungs every 4 (four) hours as needed for wheezing or shortness of breath.    Marland Kitchen aspirin EC 81 MG tablet Take 81 mg by mouth daily.    . Aspirin-Salicylamide-Caffeine (BC HEADACHE PO) Take 1 Package by mouth daily as needed (pain).     Marland Kitchen atorvastatin (LIPITOR) 10 MG tablet Take 1 tablet (10 mg total) by mouth daily. 90 tablet 3  . busPIRone (BUSPAR) 10 MG tablet Take 15 mg by mouth 3 (three) times daily.     . Calcium-Magnesium-Vitamin D (CALCIUM 1200+D3 PO) Take 1 tablet by mouth 2 (two) times daily.    . fluticasone-salmeterol (ADVAIR HFA) 115-21 MCG/ACT inhaler Inhale 2 puffs into the lungs 2 (two) times daily.    . HydrOXYzine Pamoate (VISTARIL PO) Take 25 mg by mouth 2 (two) times daily.     . insulin glargine (LANTUS) 100 UNIT/ML injection Inject 15 Units into the skin at bedtime.     Marland Kitchen lisinopril (PRINIVIL,ZESTRIL) 10 MG tablet Take 20 mg by mouth daily.     . naproxen (NAPROSYN) 500 MG tablet Take 500 mg by mouth 2 (two) times daily with a meal.    . Omega-3 Fatty Acids (FISH OIL) 1000 MG CAPS Take 1,000 mg by mouth 2 (two) times daily.    Marland Kitchen omeprazole (PRILOSEC OTC) 20 MG tablet Take 20 mg by mouth daily.    . pregabalin (LYRICA) 75 MG capsule Take 1 capsule (75 mg total) by mouth 2 (two) times daily. 60 capsule 1  . sodium chloride 1 g tablet Take 1 g by mouth daily.    Marland Kitchen topiramate (TOPAMAX) 100 MG tablet Take 1 tablet (100 mg total) by mouth 2 (two) times daily. 180 tablet 3  . venlafaxine (EFFEXOR) 75 MG tablet Take 75 mg by mouth daily.       Home: Home Living Family/patient expects to be discharged to:: Private residence Living Arrangements: Children (daughter) Available Help at Discharge: Family, Available PRN/intermittently Type of Home: House Home Access: Stairs to enter Secretary/administrator of Steps: 2 (Church builing a ramp) Home Layout: One level Bathroom  Shower/Tub: Engineer, manufacturing systems: Standard Home Equipment: Environmental consultant - 2 wheels, [employer] - single point, Banker History: Prior Function Level of Independence: Needs assistance Gait / Transfers Assistance Needed: pt's daughter was holding pt up under her shoulders from behind to assist her in walking  ADL's / Homemaking Assistance Needed: assist for bathing, dressing, toileting, could self feed and perform grooming, total assist for IADL Functional Status:  Mobility: Bed Mobility Overal bed mobility: +2 for physical assistance, Needs Assistance Bed Mobility: Rolling, Sidelying to Sit Rolling: +2 for physical assistance, Min guard Sidelying to sit: +2 for physical assistance, Min assist General bed mobility comments: instructed in log roll technique, pt very eager to get OOB.  Transfers Overall transfer level: Needs assistance Equipment used: 2 person hand held assist Transfers: Sit to/from Stand, Stand Pivot Transfers Sit to Stand: +2 physical assistance, Min assist Stand pivot transfers: Min assist, +2 physical assistance General transfer comment: assist to rise and for balance. SPT bed to chair with  MIn A for lines/balance.      ADL: ADL Overall ADL's : Needs assistance/impaired Eating/Feeding: NPO Grooming: Maximal assistance, Sitting Grooming Details (indicate cue type and reason): can wipe nose Upper Body Bathing: Maximal assistance, Sitting Lower Body Bathing: +2 for physical assistance, Maximal assistance, Sit to/from stand Upper Body Dressing : Moderate assistance, Sitting Lower Body Dressing: Maximal assistance, Sit to/from stand, +2 for physical assistance Toilet Transfer: Minimal assistance, BSC, +2 for physical assistance Toileting- Clothing Manipulation and Hygiene: Maximal assistance, Sit to/from stand, +2 for physical assistance  Cognition: Cognition Overall Cognitive Status: Impaired/Different from baseline Orientation Level: Oriented to  person, Oriented to place, Oriented to situation, Disoriented to time Cognition Arousal/Alertness: Awake/alert Behavior During Therapy: Impulsive (emotionally labile) Overall Cognitive Status: Impaired/Different from baseline Area of Impairment: Orientation, Attention, Memory, Safety/judgement, Problem solving Orientation Level: Disoriented to, Time, Situation Current Attention Level: Sustained Memory: Decreased short-term memory Safety/Judgement: Decreased awareness of safety, Decreased awareness of deficits Problem Solving: Slow processing, Difficulty sequencing, Requires verbal cues General Comments: Pt with difficulty finishing sentences and thought processes.Daughter reports this is not new.  Blood pressure 135/82, pulse 65, temperature 97.7 F (36.5 C), temperature source Oral, resp. rate 16, height  (1.499 m), weight 66.5 kg (146 lb 9.7 oz), SpO2 99 %. Physical Exam  Vitals reviewed. Constitutional: She is oriented to person, place, and time. She appears well-developed and well-nourished.  HENT:  Head: Normocephalic and atraumatic.  Right Ear: External ear normal.  Left Ear: External ear normal.  Eyes: Conjunctivae and EOM are normal.  Neck:  Dressing in place  Cardiovascular: Normal rate and regular rhythm.   Respiratory: Effort normal and breath sounds normal. No respiratory distress.  GI: Soft. Bowel sounds are normal. She exhibits no distension.  Musculoskeletal: She exhibits no edema or tenderness.  Neurological: She is alert and oriented to person, place, and time.  Some delay in processing but oriented to person, age and date of birth.  Follow simple commands Sensation intact to light touch DTRs 3+ throughout Motor: RUE: 4/5 proximal to distal LUE: 4-/5 proximal to distal B/l LE: 4+/5 proximal to distal  Skin: Skin is warm and dry.  Surgical site clean, dry, intact  Psychiatric: She has a normal mood and affect. Her behavior is normal.    Results for  orders placed or performed during the hospital encounter of 09/10/15 (from the past 24 hour(s))  Glucose, capillary     Status: None   Collection Time: 09/14/15 10:20 AM  Result Value Ref Range   Glucose-Capillary 97 65 - 99 mg/dL   Comment 1 Notify RN   Glucose, capillary     Status: None   Collection Time: 09/14/15 11:40 AM  Result Value Ref Range   Glucose-Capillary 99 65 - 99 mg/dL   Comment 1 Notify RN   Glucose, capillary     Status: Abnormal   Collection Time: 09/14/15  3:42 PM  Result Value Ref Range   Glucose-Capillary 131 (H) 65 - 99 mg/dL  Glucose, capillary     Status: None   Collection Time: 09/14/15 10:07 PM  Result Value Ref Range   Glucose-Capillary 98 65 - 99 mg/dL  Glucose, capillary     Status: Abnormal   Collection Time: 09/15/15  1:36 AM  Result Value Ref Range   Glucose-Capillary 110 (H) 65 - 99 mg/dL  Glucose, capillary     Status: Abnormal   Collection Time: 09/15/15  5:23 AM  Result Value Ref Range   Glucose-Capillary 147 (H)  65 - 99 mg/dL   No results found.  Assessment/Plan: Diagnosis: Cervical myelopathy Labs and images independently reviewed.  Records reviewed and summated above.  1. Does the need for close, 24 hr/day medical supervision in concert with the patient's rehab needs make it unreasonable for this patient to be served in a less intensive setting? Potentially  2. Co-Morbidities requiring supervision/potential complications:  HTN (monitor and provide prns in accordance with increased physical exertion and pain, currently requiring IV meds - transition to oral when appropriate),  diabetes mellitus peripheral neuropathy (Monitor in accordance with exercise and adjust meds as necessary), COPD (cont to monitor RR and O2 sats with increased physical activity), post-op pain (Biofeedback training with therapies to help reduce reliance on opiate pain medications, monitor pain control during therapies, and sedation at rest and titrate to maximum  efficacy to ensure participation and gains in therapies), Acute blood loss anemia (transfuse if necessary to ensure appropriate perfusion for increased activity tolerance), dysphagia (Cont NPO, cont SLP, consider vital stim) 3. Due to safety, skin/wound care, disease management, pain management and patient education, does the patient require 24 hr/day rehab nursing? Potentially 4. Does the patient require coordinated care of a physician, rehab nurse, PT (1-2 hrs/day, 5 days/week), OT (1-2 hrs/day, 5 days/week) and SLP (1-2 hrs/day, 5 days/week) to address physical and functional deficits in the context of the above medical diagnosis(es)? Yes Addressing deficits in the following areas: balance, endurance, locomotion, strength, transferring, dressing, feeding, toileting, speech, swallowing and psychosocial support 5. Can the patient actively participate in an intensive therapy program of at least 3 hrs of therapy per day at least 5 days per week? Yes 6. The potential for patient to make measurable gains while on inpatient rehab is excellent 7. Anticipated functional outcomes upon discharge from inpatient rehab are modified independent  with PT, modified independent with OT, independent and modified independent with SLP. 8. Estimated rehab length of stay to reach the above functional goals is: 5-9 days. 9. Does the patient have adequate social supports and living environment to accommodate these discharge functional goals? Yes 10. Anticipated D/C setting: Home 11. Anticipated post D/C treatments: HH therapy and Home excercise program 12. Overall Rehab/Functional Prognosis: excellent  RECOMMENDATIONS: This patient's condition is appropriate for continued rehabilitative care in the following setting: CIR Patient has agreed to participate in recommended program. Yes Note that insurance prior authorization may be required for reimbursement for recommended care.  Comment: Rehab Admissions Coordinator to  follow up.  Maryla MorrowAnkit Karleigh Bunte, MD 09/15/2015

## 2015-09-15 NOTE — Progress Notes (Signed)
Speech Language Pathology Treatment: Dysphagia  Patient Details Name: Lindsey Melton MRN: [id number] DOB: Jul 17, 1950 Today's Date: 09/15/2015 Time: 1050-1105 SLP Time Calculation (min) (ACUTE ONLY): 15 min  Assessment / Plan / Recommendation Clinical Impression  Pt reports subjective improvement in swallowing function and is eager to begin eating and drinking. She does however share that she continues to cough and orally suction her secretions. Ice chip trials and small spoonfuls of water result in multiple swallows (4+ per bolus) as well as coughing/throat clearing. Despite these signs of dysphagia, her vocal quality does remain clear throughout. Would suggest remaining NPO with f/u on next date to determine readiness for MBS. Would allow a few ice chips at a time after oral care in the interim.   HPI HPI: Pt admitted for ACDF C3-4. Intubated 3/1-3/3 due to upper airway edema. PMH: COPD, HTN, HLD, vertigo, anxiety and depression.      SLP Plan  Continue with current plan of care     Recommendations  Diet recommendations: NPO Medication Administration: Via alternative means             Oral Care Recommendations: Oral care QID;Oral care prior to ice chip/H20 Follow up Recommendations: Inpatient Rehab Plan: Continue with current plan of care     GO               Lindsey Melton, M.A. CCC-SLP [phone]  Lindsey Hamaiewonsky, Lindsey Melton 09/15/2015, 11:20 AM

## 2015-09-15 NOTE — Progress Notes (Signed)
Nutrition Follow-up  DOCUMENTATION CODES:   Not applicable  INTERVENTION:  Diet advancement if/when able per MD and team.  If diet unable to be advanced and enteral nutrition is warranted. Recommend Glucerna 1.2 per tube at 20 ml/hr and increasing by 10 ml every 4 hours to goal rate of 60 ml/hr to provide 1728 kcal, 86 grams of protein, and 1166 ml of free water.   RD to continue to monitor.   NUTRITION DIAGNOSIS:   Inadequate oral intake related to inability to eat as evidenced by NPO status; ongoing  GOAL:   Patient will meet greater than or equal to 90% of their needs; not met  MONITOR:   Diet advancement, Weight trends, Labs, I & O's  REASON FOR ASSESSMENT:   Consult Enteral/tube feeding initiation and management  ASSESSMENT:   65 yo Female with COPD, severe cervical spine stenosis with myelopathy, C3-4 admitted for diskectomy and fusion. Post op she was not extubated as there was no cuff leak. PCCM consulted for management of vent. Extubated 3/3.  PROCEDURE (3/1): ANTERIOR C3-4 DECOMPRESSION AND FUSION (N/A) POSTERIOR CERVICAL FUSION/FORAMINOTOMY Cervical 3-4 (N/A)  Per SLP evaluation, recommendations for NPO. SLP to follow up for MBS at a later date. If diet is unable to be advanced and pt fails MBS, recommend enteral nutrition. TF recommendations have been stated above if needed. RD to continue to monitor.   Labs and medications reviewed.   Diet Order:   NPO  Skin:  Reviewed, no issues  Last BM:  3/5  Height:   Ht Readings from Last 1 Encounters:  09/10/15 4' 11"  (1.499 m)    Weight:   Wt Readings from Last 1 Encounters:  09/11/15 146 lb 9.7 oz (66.5 kg)    Ideal Body Weight:  44.6 kg  BMI:  Body mass index is 29.6 kg/(m^2).  Estimated Nutritional Needs:   Kcal:  1600-1800  Protein:  75-90 grams  Fluid:  1.6 - 1.8 L/day  EDUCATION NEEDS:   No education needs identified at this time  Corrin Parker, MS, RD, LDN Pager #  [phone] After hours/ weekend pager # [phone]

## 2015-09-15 NOTE — Progress Notes (Signed)
[facility] PROGRESS NOTE  Lindsey Melton Crombie JYN:[id number] DOB: 09/22/1950 DOA: 09/10/2015 PCP: Kevin FentonSamuel Bradshaw, MD  HPI/Brief narrative [phone] with hx of COPD, HTN, DM, COPD who presented with cervical myelopathy. Patient underwent anterior C3- 4 decompression and fusion. Patient remained vent-dependent post-operatively  Assessment/Plan: 1. Possible upper airway edema 1. Required prolonged intubation post-operatively 2. Currently remains stable on RA 3. Continued on q12hr decadron since 3/1, now d/c'd 2. COPD 1. Stable at present 2. No wheezing currently 3. HTN 1. BP currently poorly controlled, likely secondary to being NPO and not taking PO meds 2. Chart reviewed. Lisinopril was held initially secondary to hypotension 3. Continued on IV ACEI while NPO 4. Hydralazine PRN for  Sbp>160 5. BP better 4. DM2 1. On SSI coverage 2. Stable at present 5. HLD 1. On statin prior to admission 2. Would resume when able to tolerate PO 6. Dysphagia 1. SLP following. Continue NPO per slp recs 2. IV meds as needed 7. Cervical myelopathy s/p C3-4 decompression 1. S/p surgery on 3/1 2. Orthopedic Surgery following 8. DVT prophylaxis 1. SCD's  Code Status: Full Family Communication: Pt in room Disposition Plan: Unclear at this time. CIR pending   Consultants:  PCCM  Orthopedic Surgery  CIR  Procedures:  C3-4 Anterior decompression and fusion, posterior C3-4 cervical fusion/foraminotomy  Antibiotics: Anti-infectives    Start     Dose/Rate Route Frequency Ordered Stop   09/10/15 2200  ceFAZolin (ANCEF) IVPB 1 g/50 mL premix     1 g 100 mL/hr over 30 Minutes Intravenous Every 8 hours 09/10/15 1959 09/11/15 0545   09/10/15 0945  ceFAZolin (ANCEF) IVPB 2 g/50 mL premix     2 g 100 mL/hr over 30 Minutes Intravenous To ShortStay Surgical 09/10/15 0939 09/10/15 1603   09/10/15 0942  ceFAZolin (ANCEF) 2-3 GM-% IVPB SOLR    Comments:  Scronce, Trina   : cabinet override   09/10/15 0942 09/10/15 2159      HPI/Subjective: No complaints  Objective: Filed Vitals:   09/14/15 2005 09/14/15 2102 09/15/15 0523 09/15/15 0834  BP: 171/94  135/82   Pulse: 66  65   Temp: 97.7 F (36.5 C)  97.7 F (36.5 C)   TempSrc: Oral  Oral   Resp: 16  16   Height:      Weight:      SpO2: 100% 98% 99% 98%   No intake or output data in the 24 hours ending 09/15/15 1641 Filed Weights   09/10/15 0939 09/11/15 0500  Weight: 62.143 kg (137 lb) 66.5 kg (146 lb 9.7 oz)    Exam:   General:  Awake, in nad, sitting in chair  Cardiovascular: regular, s1, s2  Respiratory: normal resp effort, no wheezing  Abdomen: soft,nondistended, pos BS  Musculoskeletal: perfused,no clubbing, no cyanosis  Data Reviewed: Basic Metabolic Panel:  Recent Labs Lab 09/10/15 2140 09/11/15 0320 09/12/15 0250 09/14/15 0416  NA 143 140 141 141  K 4.4 4.4 4.1 3.9  CL 109 109 110 113*  CO2 21* 19* 20* 19*  GLUCOSE 196* 216* 180* 166*  BUN 24* 22* 29* 26*  CREATININE 1.17* 1.14* 0.98 0.76  CALCIUM 9.3 8.9 9.0 8.8*  MG  --  1.7  --   --   PHOS  --  4.5  --   --    Liver Function Tests:  Recent Labs Lab 09/10/15 2140  AST 23  ALT 14  ALKPHOS 104  BILITOT 0.4  PROT 5.5*  ALBUMIN 3.3*   No  results for input(s): LIPASE, AMYLASE in the last 168 hours. No results for input(s): AMMONIA in the last 168 hours. CBC:  Recent Labs Lab 09/10/15 2140 09/11/15 0320 09/12/15 0250  WBC 9.3 9.3 10.3  HGB 11.4* 10.7* 10.7*  HCT 35.5* 32.5* 32.7*  MCV 88.1 87.1 87.4  PLT 268 262 310   Cardiac Enzymes: No results for input(s): CKTOTAL, CKMB, CKMBINDEX, TROPONINI in the last 168 hours. BNP (last 3 results) No results for input(s): BNP in the last 8760 hours.  ProBNP (last 3 results) No results for input(s): PROBNP in the last 8760 hours.  CBG:  Recent Labs Lab 09/15/15 0136 09/15/15 0523 09/15/15 0828 09/15/15 1244 09/15/15 1317  GLUCAP 110* 147* 106* 54* 144*    No  results found for this or any previous visit (from the past 240 hour(s)).   Studies: No results found.  Scheduled Meds: . antiseptic oral rinse  7 mL Mouth Rinse QID  . arformoterol  15 mcg Nebulization BID  . atorvastatin  10 mg Per Tube Daily  . budesonide (PULMICORT) nebulizer solution  0.5 mg Nebulization BID  . chlorhexidine gluconate  15 mL Mouth Rinse BID  . enalaprilat  0.625 mg Intravenous 4 times per day  . insulin aspart  0-15 Units Subcutaneous 6 times per day  . topiramate  100 mg Per Tube BID   Continuous Infusions: . dextrose 5 % and 0.9% NaCl 75 mL/hr at 09/15/15 1210    Active Problems:   Myelopathy (HCC)   Respiratory failure (HCC)   S/P cervical spinal fusion   Surgery, elective   Postprocedural hypotension   Ronith Berti K  [facility] Pager [phone]. If 7PM-7AM, please contact night-coverage at [email or web address], password [facility] 09/15/2015, 4:41 PM  LOS: 5 days

## 2015-09-16 ENCOUNTER — Encounter (HOSPITAL_COMMUNITY): Payer: Self-pay | Admitting: Orthopedic Surgery

## 2015-09-16 ENCOUNTER — Inpatient Hospital Stay (HOSPITAL_COMMUNITY): Payer: BLUE CROSS/BLUE SHIELD

## 2015-09-16 DIAGNOSIS — M4712 Other spondylosis with myelopathy, cervical region: Secondary | ICD-10-CM | POA: Diagnosis present

## 2015-09-16 DIAGNOSIS — M5412 Radiculopathy, cervical region: Secondary | ICD-10-CM | POA: Diagnosis present

## 2015-09-16 DIAGNOSIS — G959 Disease of spinal cord, unspecified: Secondary | ICD-10-CM | POA: Diagnosis present

## 2015-09-16 LAB — GLUCOSE, CAPILLARY
Glucose-Capillary: 132 mg/dL — ABNORMAL HIGH (ref 65–99)
Glucose-Capillary: 119 mg/dL — ABNORMAL HIGH (ref 65–99)
Glucose-Capillary: 119 mg/dL — ABNORMAL HIGH (ref 65–99)
Glucose-Capillary: 180 mg/dL — ABNORMAL HIGH (ref 65–99)

## 2015-09-16 MED ORDER — KETOROLAC TROMETHAMINE 30 MG/ML IJ SOLN
30.0000 mg | Freq: Once | INTRAMUSCULAR | Status: AC
  Administered 2015-09-16: 30 mg via INTRAVENOUS
  Filled 2015-09-16: qty 1

## 2015-09-16 NOTE — [place] FL2 (Signed)
[place] MEDICAID FL2 LEVEL OF CARE SCREENING TOOL     IDENTIFICATION  Patient Name: Lindsey Melton Birthdate: [birth year] Sex: female Admission Date (Current Location): 09/10/2015  [facility] [place]  [place]   Facility and Address:  [facility] [place], [address], [place], [place] [address]      Provider Number: [id number]  Attending Physician Name and Address:  Jerald KiefStephen K Chiu, MD  Relative Name and Phone Number:       Current Level of Care: Hospital Recommended Level of Care: Skilled Nursing Facility Prior Approval Number:    Date Approved/Denied:   PASRR Number: [id number]  Discharge Plan: SNF    Current Diagnoses: Patient Active Problem List   Diagnosis Date Noted  . Cervical myelopathy with cervical radiculopathy 09/16/2015  . Benign essential HTN   . DM type 2 with diabetic peripheral neuropathy (HCC)   . Chronic obstructive pulmonary disease (HCC)   . Post-operative pain   . Acute blood loss anemia   . Dysphagia   . Myelopathy (HCC) 09/10/2015  . Respiratory failure (HCC)   . S/P cervical spinal fusion   . Surgery, elective   . Postprocedural hypotension   . Spondylosis, cervical, with myelopathy 08/24/2015  . Polysubstance abuse 08/24/2015  . UTI (urinary tract infection) 08/18/2015  . Cold extremities 08/08/2015  . Leg pain 08/08/2015  . Muscle weakness 07/29/2015  . Neck pain 07/25/2015  . T2DM (type 2 diabetes mellitus) (HCC) 07/25/2015  . Hyponatremia 07/25/2015    Orientation RESPIRATION BLADDER Height & Weight     Self, Time, Situation, Place  O2 (4L) Continent Weight: 146 lb 9.7 oz (66.5 kg) Height:  4\' 11"  (149.9 cm)  BEHAVIORAL SYMPTOMS/MOOD NEUROLOGICAL BOWEL NUTRITION STATUS      Continent Diet (please see dc summary for dietary needs)  AMBULATORY STATUS COMMUNICATION OF NEEDS Skin   Limited Assist Verbally Surgical wounds                       Personal Care Assistance Level of Assistance   Feeding, Dressing   Feeding assistance: Independent Dressing Assistance: Limited assistance     Functional Limitations Info             SPECIAL CARE FACTORS FREQUENCY  PT (By licensed PT), OT (By licensed OT)     PT Frequency: daily OT Frequency: daily            Contractures Contractures Info: Not present    Additional Factors Info  Allergies   Allergies Info: prozac           Current Medications (09/16/2015):  This is the current hospital active medication list Current Facility-Administered Medications  Medication Dose Route Frequency Provider Last Rate Last Dose  . albuterol (PROVENTIL) (2.5 MG/3ML) 0.083% nebulizer solution 2.5 mg  2.5 mg Nebulization Q3H PRN Rahul P Desai, PA-C      . antiseptic oral rinse solution (CORINZ)  7 mL Mouth Rinse QID Coralyn HellingVineet Sood, MD   7 mL at 09/16/15 0103  . arformoterol (BROVANA) nebulizer solution 15 mcg  15 mcg Nebulization BID Rahul P Desai, PA-C   15 mcg at 09/16/15 1042  . atorvastatin (LIPITOR) tablet 10 mg  10 mg Per Tube Daily Rahul P Desai, PA-C   10 mg at 09/12/15 0919  . budesonide (PULMICORT) nebulizer solution 0.5 mg  0.5 mg Nebulization BID Rahul P Desai, PA-C   0.5 mg at 09/16/15 1043  . chlorhexidine gluconate (PERIDEX) 0.12 %  solution 15 mL  15 mL Mouth Rinse BID Coralyn Helling, MD   15 mL at 09/15/15 2026  . dextrose 5 %-0.9 % sodium chloride infusion   Intravenous Continuous Jerald Kief, MD 75 mL/hr at 09/15/15 1210    . diphenhydrAMINE (BENADRYL) injection 50 mg  50 mg Intravenous Q6H PRN Coralyn Helling, MD   50 mg at 09/14/15 2227  . enalaprilat (VASOTEC) injection 0.625 mg  0.625 mg Intravenous 4 times per day Jerald Kief, MD   0.625 mg at 09/16/15 0533  . insulin aspart (novoLOG) injection 0-15 Units  0-15 Units Subcutaneous 6 times per day Venita Lick, MD   2 Units at 09/16/15 0448  . methocarbamol (ROBAXIN) tablet 500 mg  500 mg Oral Q6H PRN Venita Lick, MD      . ondansetron [facility]) injection 4 mg  4 mg  Intravenous Q4H PRN Venita Lick, MD   4 mg at 09/14/15 1355  . oxyCODONE (Oxy IR/ROXICODONE) immediate release tablet 10 mg  10 mg Oral Q6H PRN Venita Lick, MD      . topiramate (TOPAMAX) tablet 100 mg  100 mg Per Tube BID Rahul P Desai, PA-C   100 mg at 09/12/15 0919     Discharge Medications: Please see discharge summary for a list of discharge medications.  Relevant Imaging Results:  Relevant Lab Results:   Additional Information SSN: [id number]  Rondel Baton, LCSW

## 2015-09-16 NOTE — Progress Notes (Signed)
Rehab admissions - [facility].  Insurance case manager is open to SNF or HH therapies.  I will speak to patient and I will follow up with acute case manager.  Call me for questions.  #[phone]

## 2015-09-16 NOTE — Progress Notes (Signed)
Rehab admissions - I met with patient yesterday afternoon.  She would like to come to inpatient rehab.  I will open the case with BCBS and request acute inpatient rehab admission.  I will follow up with all once I hear back from insurance case manager.  Call me for questions.  #[phone]

## 2015-09-16 NOTE — Progress Notes (Signed)
Occupational Therapy Treatment Patient Details Name: Lindsey Melton MRN: [id number] DOB: 1951/04/02 Today's Date: 09/16/2015    History of present illness Pt admitted for ACDF C3-4. Intubated 3/1-3/3. PMH: COPD, HTN, HLD, vertigo, anxiety and depression.   OT comments  Pt making good progress. Using L hand as a functional assist. Ataxic gait. Continue to recommend CIR for rehab. Will continue to follow acutely.   Follow Up Recommendations  CIR;Supervision/Assistance - 24 hour    Equipment Recommendations  3 in 1 bedside comode    Recommendations for Other Services Rehab consult    Precautions / Restrictions Precautions Precautions: Fall;Cervical Required Braces or Orthoses: Cervical Brace Cervical Brace: Hard collar (when OOB) Restrictions Weight Bearing Restrictions: No       Mobility Bed Mobility               General bed mobility comments: OOB in chair upon arrival  Transfers Overall transfer level: Needs assistance Equipment used: Rolling walker (2 wheeled) Transfers: Sit to/from Stand Sit to Stand: Min assist         General transfer comment: from recliner and BSC; cues for hand placement and to keep RW close until descending to chair/BSC; min guard for safety with a little instability upon standing first trial; pt able to correct balance    Balance Overall balance assessment: Needs assistance Sitting-balance support: No upper extremity supported;Feet supported Sitting balance-Leahy Scale: Fair     Standing balance support: Bilateral upper extremity supported Standing balance-Leahy Scale: Poor (ataxic gait)                     ADL Overall ADL's : Needs assistance/impaired Eating/Feeding: NPO   Grooming: Minimal assistance Grooming Details (indicate cue type and reason): assist to set up and stabilize. Attempting to use LUE functionally Upper Body Bathing: Moderate assistance;Sitting   Lower Body Bathing: Moderate assistance;Sit to/from  stand           Toilet Transfer: Minimal assistance;Ambulation;RW;BSC   Toileting- Clothing Manipulation and Hygiene: Moderate assistance;Sit to/from stand       Functional mobility during ADLs: Minimal assistance;Rolling walker;Cueing for safety        Vision                     Perception     Praxis      Cognition   Behavior During Therapy: [facility] for tasks assessed/performed Overall Cognitive Status: Within Functional Limits for tasks assessed                Problem Solving: Slow processing;Requires verbal cues General Comments: Daughter not present to ask about cogntion. Pt states she did not sleep last night.    Extremity/Trunk Assessment               Exercises   Shoulder Instructions       General Comments      Pertinent Vitals/ Pain       Pain Assessment: Faces Faces Pain Scale: Hurts a little bit Pain Location: LUE/neck Pain Descriptors / Indicators: Discomfort Pain Intervention(s): Limited activity within patient's tolerance  Home Living                                          Prior Functioning/Environment              Frequency Min 2X/week     Progress Toward Goals  OT Goals(current goals can now be found in the care plan section)  Progress towards OT goals: Progressing toward goals  Acute Rehab OT Goals Patient Stated Goal: be more independent OT Goal Formulation: With patient Time For Goal Achievement: 09/26/15 Potential to Achieve Goals: Good ADL Goals Pt Will Perform Grooming: with supervision;standing Pt Will Perform Upper Body Bathing: with supervision;sitting Pt Will Perform Lower Body Bathing: with supervision;sit to/from stand Pt Will Perform Upper Body Dressing: with supervision;sitting Pt Will Perform Lower Body Dressing: with supervision;sit to/from stand Pt Will Transfer to Toilet: with supervision;ambulating;regular height toilet Pt Will Perform Toileting - Clothing Manipulation  and hygiene: with supervision;sit to/from stand Pt Will Perform Tub/Shower Transfer: Tub transfer;with min guard assist;shower seat;ambulating Additional ADL Goal #1: Pt will generalize cervical precautions in ADL with supervision.  Plan Discharge plan remains appropriate    Co-evaluation                 End of Session Equipment Utilized During Treatment: Gait belt;Rolling walker;Cervical collar   Activity Tolerance Patient tolerated treatment well   Patient Left in chair;with call bell/phone within reach;with chair alarm set   Nurse Communication Mobility status        Time: 1610-96041003-1026 OT Time Calculation (min): 23 min  Charges: OT General Charges $OT Visit: 1 Procedure OT Treatments $Self Care/Home Management : 23-37 mins  Daralyn Bert,HILLARY 09/16/2015, 1:30 PM   [facility] Treshawn Allen, OTR/L  [phone] 09/16/2015

## 2015-09-16 NOTE — Progress Notes (Signed)
[facility] PROGRESS NOTE  Lindsey Melton NWG:[id number] DOB: 04/03/1951 DOA: 09/10/2015 PCP: Kevin Fenton, MD  HPI/Brief narrative [phone] with hx of COPD, HTN, DM, COPD who presented with cervical myelopathy. Patient underwent anterior C3- 4 decompression and fusion. Patient remained vent-dependent post-operatively  Assessment/Plan: 1. Possible upper airway edema 1. Required prolonged intubation post-operatively 2. Currently remains stable on RA 3. Had been continued on q12hr decadron since 3/1, d/c'd on 3/6 2. COPD 1. Stable at present 2. No wheezing currently 3. HTN 1. BP currently poorly controlled, likely secondary to being NPO and not taking PO meds 2. Chart reviewed. Lisinopril was held initially secondary to hypotension 3. Continued on IV ACEI while NPO 4. Hydralazine PRN for  Sbp>160 5. BP better 6. Plan to resume home PO meds when able to tolerate PO (see below) 4. DM2 1. On SSI coverage 2. Stable at present 5. HLD 1. On statin prior to admission 2. Would resume when able to tolerate PO 6. Dysphagia 1. SLP following. Continue NPO per slp recs 2. IV meds as needed 3. Plan for MBS per SLP 7. Cervical myelopathy s/p C3-4 decompression 1. S/p surgery on 3/1 2. Orthopedic Surgery following 8. DVT prophylaxis 1. SCD's  Code Status: Full Family Communication: Pt in room Disposition Plan: Unclear at this time. CIR pending   Consultants:  PCCM  Orthopedic Surgery  CIR  Procedures:  C3-4 Anterior decompression and fusion, posterior C3-4 cervical fusion/foraminotomy  Antibiotics: Anti-infectives    Start     Dose/Rate Route Frequency Ordered Stop   09/10/15 2200  ceFAZolin (ANCEF) IVPB 1 g/50 mL premix     1 g 100 mL/hr over 30 Minutes Intravenous Every 8 hours 09/10/15 1959 09/11/15 0545   09/10/15 0945  ceFAZolin (ANCEF) IVPB 2 g/50 mL premix     2 g 100 mL/hr over 30 Minutes Intravenous To ShortStay Surgical 09/10/15 0939 09/10/15 1603   09/10/15  0942  ceFAZolin (ANCEF) 2-3 GM-% IVPB SOLR    Comments:  Scronce, Trina   : cabinet override      09/10/15 0942 09/10/15 2159      HPI/Subjective: Without complaints today  Objective: Filed Vitals:   09/15/15 1831 09/15/15 2000 09/15/15 2020 09/16/15 0439  BP: 151/83  157/81 156/92  Pulse:   63 66  Temp:   99.2 F (37.3 C) 98.3 F (36.8 C)  TempSrc:   Oral   Resp:   18 18  Height:      Weight:      SpO2:  98% 100% 99%   No intake or output data in the 24 hours ending 09/16/15 1153 Filed Weights   09/10/15 0939 09/11/15 0500  Weight: 62.143 kg (137 lb) 66.5 kg (146 lb 9.7 oz)    Exam:   General:  Awake, in nad, sitting in chair  Cardiovascular: regular, s1, s2  Respiratory: normal resp effort, no wheezing  Abdomen: soft, pos BS  Musculoskeletal: perfused,no clubbing, no cyanosis  Data Reviewed: Basic Metabolic Panel:  Recent Labs Lab 09/10/15 2140 09/11/15 0320 09/12/15 0250 09/14/15 0416  NA 143 140 141 141  K 4.4 4.4 4.1 3.9  CL 109 109 110 113*  CO2 21* 19* 20* 19*  GLUCOSE 196* 216* 180* 166*  BUN 24* 22* 29* 26*  CREATININE 1.17* 1.14* 0.98 0.76  CALCIUM 9.3 8.9 9.0 8.8*  MG  --  1.7  --   --   PHOS  --  4.5  --   --    Liver Function  Tests:  Recent Labs Lab 09/10/15 2140  AST 23  ALT 14  ALKPHOS 104  BILITOT 0.4  PROT 5.5*  ALBUMIN 3.3*   No results for input(s): LIPASE, AMYLASE in the last 168 hours. No results for input(s): AMMONIA in the last 168 hours. CBC:  Recent Labs Lab 09/10/15 2140 09/11/15 0320 09/12/15 0250  WBC 9.3 9.3 10.3  HGB 11.4* 10.7* 10.7*  HCT 35.5* 32.5* 32.7*  MCV 88.1 87.1 87.4  PLT 268 262 310   Cardiac Enzymes: No results for input(s): CKTOTAL, CKMB, CKMBINDEX, TROPONINI in the last 168 hours. BNP (last 3 results) No results for input(s): BNP in the last 8760 hours.  ProBNP (last 3 results) No results for input(s): PROBNP in the last 8760 hours.  CBG:  Recent Labs Lab 09/15/15 1317  09/15/15 1656 09/15/15 1955 09/15/15 2327 09/16/15 0428  GLUCAP 144* 119* 107* 131* 132*    No results found for this or any previous visit (from the past 240 hour(s)).   Studies: No results found.  Scheduled Meds: . antiseptic oral rinse  7 mL Mouth Rinse QID  . arformoterol  15 mcg Nebulization BID  . atorvastatin  10 mg Per Tube Daily  . budesonide (PULMICORT) nebulizer solution  0.5 mg Nebulization BID  . chlorhexidine gluconate  15 mL Mouth Rinse BID  . enalaprilat  0.625 mg Intravenous 4 times per day  . insulin aspart  0-15 Units Subcutaneous 6 times per day  . topiramate  100 mg Per Tube BID   Continuous Infusions: . dextrose 5 % and 0.9% NaCl 75 mL/hr at 09/15/15 1210    Active Problems:   Myelopathy (HCC)   Respiratory failure (HCC)   S/P cervical spinal fusion   Surgery, elective   Postprocedural hypotension   Benign essential HTN   DM type 2 with diabetic peripheral neuropathy (HCC)   Chronic obstructive pulmonary disease (HCC)   Post-operative pain   Acute blood loss anemia   Dysphagia   Cervical myelopathy with cervical radiculopathy   CHIU, STEPHEN K  [facility] Pager [phone]. If 7PM-7AM, please contact night-coverage at [email or web address], password [facility] 09/16/2015, 11:53 AM  LOS: 6 days

## 2015-09-16 NOTE — Progress Notes (Signed)
Patient ID: Lindsey Melton, female   DOB: Jul 18, 1950, 65 y.o.   MRN: [id number]    Subjective: 6 Days Post-Op Procedure(s) (LRB): ANTERIOR C3-4 DECOMPRESSION AND FUSION (N/A) POSTERIOR CERVICAL FUSION/FORAMINOTOMY Cervical 3-4 (N/A) Patient reports pain as 3 on 0-10 scale.   Denies CP or SOB.  Voiding without difficulty. Positive flatus. Objective: Vital signs in last 24 hours: Temp:  [97.1 F (36.2 C)-99.2 F (37.3 C)] 98.3 F (36.8 C) (03/07 0439) Pulse Rate:  [63-72] 66 (03/07 0439) Resp:  [18] 18 (03/07 0439) BP: (140-157)/(76-92) 156/92 mmHg (03/07 0439) SpO2:  [98 %-100 %] 99 % (03/07 0439)  Intake/Output from previous day:   Intake/Output this shift:    Labs: No results for input(s): HGB in the last 72 hours. No results for input(s): WBC, RBC, HCT, PLT in the last 72 hours.  Recent Labs  09/14/15 0416  NA 141  K 3.9  CL 113*  CO2 19*  BUN 26*  CREATININE 0.76  GLUCOSE 166*  CALCIUM 8.8*   No results for input(s): LABPT, INR in the last 72 hours.  Physical Exam: Neurologically intact ABD soft Sensation intact distally Incision: dressing C/D/I No cellulitis present Compartment soft Cervical brace in place Pt sitting up in chair - in good spirits Assessment/Plan: 6 Days Post-Op Procedure(s) (LRB): ANTERIOR C3-4 DECOMPRESSION AND FUSION (N/A) POSTERIOR CERVICAL FUSION/FORAMINOTOMY Cervical 3-4 (N/A) Up with therapy  Pt waiting for another swallowing evaluation - she is currently only on sips/chips In patient rehab was denied by her insurance co. SW working on placement at a SNF - they will let us know    Mayo, Baxter Kailarmen Christina for Dr. Venita Lickahari [facility] [phone] 09/16/2015, 12:50 PM

## 2015-09-16 NOTE — Progress Notes (Signed)
Physical Therapy Treatment Patient Details Name: Lindsey Melton MRN: [id number] DOB: [birth year] Today's Date: 09/16/2015    History of Present Illness Pt admitted for ACDF C3-4. Intubated 3/1-3/3. PMH: COPD, HTN, HLD, vertigo, anxiety and depression.    PT Comments    Patient demonstrated bilat LE weakness and a little more unsteady this session when ambulating but no LOB. Pt led through bilat LE exercises and tolerated well. Continue to progress as tolerated.    Follow Up Recommendations  CIR;Supervision/Assistance - 24 hour     Equipment Recommendations  None recommended by PT    Recommendations for Other Services Rehab consult     Precautions / Restrictions Precautions Precautions: Fall;Cervical Required Braces or Orthoses: Cervical Brace Cervical Brace: Hard collar (when OOB) Restrictions Weight Bearing Restrictions: No    Mobility  Bed Mobility               General bed mobility comments: OOB in chair upon arrival  Transfers Overall transfer level: Needs assistance Equipment used: Rolling walker (2 wheeled) Transfers: Sit to/from Stand Sit to Stand: Min guard;+2 safety/equipment         General transfer comment: from recliner and BSC; cues for hand placement and to keep RW close until descending to chair/BSC; min guard for safety with a little instability upon standing first trial; pt able to correct balance  Ambulation/Gait Ambulation/Gait assistance: Min guard;Min assist Ambulation Distance (Feet): 100 Feet Assistive device: Rolling walker (2 wheeled) Gait Pattern/deviations: Step-through pattern;Decreased stride length;Shuffle;Trunk flexed     General Gait Details: cues for posture, increased step length and facilitation of bilat heel strike; pt reported that L knee felt like it could buckle but no knee buckling noted; pt unsteady at times and required min A to regain balance X 2 and especially unsteady when turning; pt demonstrated weakness in bilat  LE when ambulating this session   Stairs            Wheelchair Mobility    Modified Rankin (Stroke Patients Only)       Balance Overall balance assessment: Needs assistance Sitting-balance support: No upper extremity supported;Feet supported Sitting balance-Leahy Scale: Fair     Standing balance support: Bilateral upper extremity supported Standing balance-Leahy Scale: Poor                      Cognition Arousal/Alertness: Awake/alert Behavior During Therapy: WFL for tasks assessed/performed Overall Cognitive Status: Within Functional Limits for tasks assessed               Problem Solving: Slow processing;Requires verbal cues      Exercises General Exercises - Lower Extremity Ankle Circles/Pumps: AROM;Both;10 reps;Seated Long Arc Quad: AROM;Both;Seated;15 reps Hip ABduction/ADduction: AROM;Both;15 reps;Seated Hip Flexion/Marching: AROM;Both;15 reps;Seated    General Comments General comments (skin integrity, edema, etc.): pt worried about being a burden to daughter and reported daughter has not been able to work since having to take care of her prior to surgery; pt with limited ROM of L UE       Pertinent Vitals/Pain Pain Assessment: Faces Faces Pain Scale: Hurts a little bit Pain Location: L UE Pain Descriptors / Indicators: Sore Pain Intervention(s): Monitored during session;Premedicated before session    Home Living                      Prior Function            PT Goals (current goals can now be found in the care plan  section) Acute Rehab PT Goals Patient Stated Goal: be more independent Progress towards PT goals: Progressing toward goals    Frequency  Min 4X/week    PT Plan Current plan remains appropriate    Co-evaluation             End of Session Equipment Utilized During Treatment: Gait belt;Cervical collar Activity Tolerance: Patient tolerated treatment well Patient left: with call bell/phone within  reach;in chair;with chair alarm set;with nursing/sitter in room     Time: 9147-82951158-1228 PT Time Calculation (min) (ACUTE ONLY): 30 min  Charges:  $Gait Training: 8-22 mins $Therapeutic Exercise: 8-22 mins                    G Codes:      Lindsey Melton, PTA Pager: [phone]   09/16/2015, 12:40 PM

## 2015-09-16 NOTE — Clinical Social Work Note (Signed)
Patient daughter is employed by [employer] in [place] and requests patient receive STR there if possible.  CSW sent referral to [facility] who is agreeable to initiating insurance authorization this afternoon.  Patient and daughter aware.  Vickii PennaGina Ermagene Saidi, LCSW [phone]  5N1-9; 2S 15-16 and Hospital Psychiatric Service Line Licensed Clinical Social Worker

## 2015-09-16 NOTE — Clinical Social Work Note (Signed)
Clinical Social Work Assessment  Patient Details  Name: Lindsey Melton MRN: [id number] Date of Birth: 1950/08/11  Date of referral:  09/16/15               Reason for consult:  Facility Placement                Permission sought to share information with:  Facility Sport and exercise psychologist, Family Supports Permission granted to share information::  Yes, Verbal Permission Granted  Name::        Agency::  [facility]  Relationship::   (daughter Autumn Messing)  Contact Information:     Housing/Transportation Living arrangements for the past 2 months:  [place] of Information:  Patient, Adult Children Patient Interpreter Needed:  None Criminal Activity/Legal Involvement Pertinent to Current Situation/Hospitalization:  No - Comment as needed Significant Relationships:  Adult Children Lives with:  Self Do you feel safe going back to the place where you live?  No Need for family participation in patient care:  Yes (Comment) (patient wishes for daughter to be involved in placement decisions)  Care giving concerns:  Daughter would like for patient to receive STR at a facility near their home.  The patient and family live in [place].   Social Worker assessment / plan:  CSW met with patient and daughter to review disposition.  PT originally recommended CIR.  CIR received insurance denial from [place].  Plan is SNF for STR at this time.  Patient states she is from home alone and does not feel comfortable being on her own after this surgery.  Daughter is also in agreement with placement.  Of note, daughter is employed at [employer] and wishes for CSW to initiate referral there.  Employment status:  Retired Forensic scientist:  Other (Comment Required) (BCBS other) PT Recommendations:  [place] / Referral to community resources:  [place]  Patient/Family's Response to care:  Daughter and patient are both  agreeable to SNF  Patient/Family's Understanding of and Emotional Response to Diagnosis, Current Treatment, and Prognosis:  Patient and daughter remain realistic regarding level of care needed at time of discharge.  Emotional Assessment Appearance:  Appears older than stated age Attitude/Demeanor/Rapport:    Affect (typically observed):  Accepting Orientation:  Oriented to Self, Oriented to Place, Oriented to  Time, Oriented to Situation Alcohol / Substance use:  Not Applicable Psych involvement (Current and /or in the community):  No (Comment)  Discharge Needs  Concerns to be addressed:  No discharge needs identified Readmission within the last 30 days:  No Current discharge risk:  None Barriers to Discharge:  Continued Medical Work up, [place], [place], LCSW 09/16/2015, 3:20 PM

## 2015-09-16 NOTE — Progress Notes (Signed)
MBSS complete. Full report located under chart review in imaging section.  Elray Dains Paiewonsky, M.A. CCC-SLP [phone]  

## 2015-09-16 NOTE — Progress Notes (Signed)
Speech Language Pathology Treatment: Dysphagia  Patient Details Name: Lindsey Melton MRN: [id number] DOB: [birth year] Today's Date: 09/16/2015 Time: [id number] SLP Time Calculation (min) (ACUTE ONLY): 9 min  Assessment / Plan / Recommendation Clinical Impression  Pt has decreased signs of dysphagia this morning. Only two swallows are observed on average per bolus, and only one throat clear is noted across all trials of ice chips and spoonfuls of thin liquids. She is ready for objective testing to determine readiness for POs. Will plan for this afternoon.   HPI HPI: Pt admitted for ACDF C3-4. Intubated 3/1-3/3 due to upper airway edema. PMH: COPD, HTN, HLD, vertigo, anxiety and depression.      SLP Plan  MBS     Recommendations  Diet recommendations: NPO Medication Administration: Via alternative means             Oral Care Recommendations: Oral care QID;Oral care prior to ice chip/H20 Follow up Recommendations: Inpatient Rehab Plan: MBS     GO               Maxcine HamLaura Paiewonsky, M.A. CCC-SLP [phone]  Maxcine Hamaiewonsky, Harue Pribble 09/16/2015, 9:32 AM

## 2015-09-16 NOTE — Clinical Social Work Placement (Signed)
   CLINICAL SOCIAL WORK PLACEMENT  NOTE  Date:  09/16/2015  Patient Details  Name: Lindsey Melton MRN: [id number] Date of Birth: March 06, 1951  Clinical Social Work is seeking post-discharge placement for this patient at the Skilled  Nursing Facility level of care (*CSW will initial, date and re-position this form in  chart as items are completed):  Yes   Patient/family provided with [facility] Clinical Social Work Department's list of facilities offering this level of care within the geographic area requested by the patient (or if unable, by the patient's family).  Yes   Patient/family informed of their freedom to choose among providers that offer the needed level of care, that participate in Medicare, Medicaid or managed care program needed by the patient, have an available bed and are willing to accept the patient.  Yes   Patient/family informed of [facility]'s ownership interest in [facility] and [facility], as well as of the fact that they are under no obligation to receive care at these facilities.  PASRR submitted to EDS on       PASRR number received on       Existing PASRR number confirmed on 09/16/15     FL2 transmitted to all facilities in geographic area requested by pt/family on 09/16/15     FL2 transmitted to all facilities within larger geographic area on       Patient informed that his/her managed care company has contracts with or will negotiate with certain facilities, including the following:        Yes   Patient/family informed of bed offers received.  Patient chooses bed at       Physician recommends and patient chooses bed at      Patient to be transferred to   on  .  Patient to be transferred to facility by       Patient family notified on   of transfer.  Name of family member notified:        PHYSICIAN       Additional Comment:    _______________________________________________ Rondel BatonIngle, Mylissa Lambe C, LCSW 09/16/2015, 3:16 PM

## 2015-09-17 DIAGNOSIS — M4712 Other spondylosis with myelopathy, cervical region: Secondary | ICD-10-CM

## 2015-09-17 LAB — GLUCOSE, CAPILLARY
Glucose-Capillary: 119 mg/dL — ABNORMAL HIGH (ref 65–99)
Glucose-Capillary: 125 mg/dL — ABNORMAL HIGH (ref 65–99)
Glucose-Capillary: 147 mg/dL — ABNORMAL HIGH (ref 65–99)
Glucose-Capillary: 159 mg/dL — ABNORMAL HIGH (ref 65–99)
Glucose-Capillary: 226 mg/dL — ABNORMAL HIGH (ref 65–99)
Glucose-Capillary: 87 mg/dL (ref 65–99)
Glucose-Capillary: 91 mg/dL (ref 65–99)

## 2015-09-17 MED ORDER — METHOCARBAMOL 500 MG PO TABS: 500.0000 mg | ORAL_TABLET | Freq: Three times a day (TID) | ORAL | Status: DC | PRN

## 2015-09-17 MED ORDER — POLYETHYLENE GLYCOL 3350 17 G PO PACK
17.0000 g | PACK | Freq: Every day | ORAL | Status: DC
  Administered 2015-09-17 – 2015-09-18 (×2): 17 g via ORAL
  Filled 2015-09-17 (×2): qty 1

## 2015-09-17 MED ORDER — ONDANSETRON HCL 4 MG PO TABS: 4.0000 mg | ORAL_TABLET | Freq: Three times a day (TID) | ORAL | Status: DC | PRN

## 2015-09-17 MED ORDER — OXYCODONE-ACETAMINOPHEN 10-325 MG PO TABS: 1.0000 | ORAL_TABLET | ORAL | Status: DC | PRN

## 2015-09-17 NOTE — Progress Notes (Signed)
Physical Therapy Treatment Patient Details Name: Lindsey Melton MRN: [id number] DOB: [birth year] Today's Date: 09/17/2015    History of Present Illness Pt admitted for ACDF C3-4. Intubated 3/1-3/3. PMH: COPD, HTN, HLD, vertigo, anxiety and depression.    PT Comments    Patient continues to c/o pain in L UE but able to maintain grip when using RW. Pt is unsteady with gait and will need supervision for safe mobility.  Continue to progress as tolerated.   Follow Up Recommendations  CIR;Supervision/Assistance - 24 hour     Equipment Recommendations  None recommended by PT    Recommendations for Other Services Rehab consult     Precautions / Restrictions Precautions Precautions: Fall;Cervical Required Braces or Orthoses: Cervical Brace Cervical Brace: Hard collar (when OOB) Restrictions Weight Bearing Restrictions: No    Mobility  Bed Mobility               General bed mobility comments: OOB in chair upon arrival  Transfers Overall transfer level: Needs assistance Equipment used: Rolling walker (2 wheeled)   Sit to Stand: Min guard         General transfer comment: cues for safe hand placement to stand and L hand placement positioning on RW; min guard for safety   Ambulation/Gait Ambulation/Gait assistance: Min guard;Min assist Ambulation Distance (Feet): 120 Feet Assistive device: Rolling walker (2 wheeled) Gait Pattern/deviations: Step-through pattern;Decreased stride length;Decreased dorsiflexion - right;Decreased dorsiflexion - left     General Gait Details: cues for position of RW and bilat heel strike; pt unsteady and bilat LE weakness with LOB X 2 once when turning; cues to maintian L hand positioning on RW   Stairs            Wheelchair Mobility    Modified Rankin (Stroke Patients Only)       Balance Overall balance assessment: Needs assistance Sitting-balance support: No upper extremity supported;Feet supported Sitting balance-Leahy  Scale: Fair     Standing balance support: Bilateral upper extremity supported Standing balance-Leahy Scale: Poor                      Cognition Arousal/Alertness: Awake/alert Behavior During Therapy: WFL for tasks assessed/performed Overall Cognitive Status: Within Functional Limits for tasks assessed Area of Impairment: Memory     Memory: Decreased short-term memory       Problem Solving: Slow processing;Requires verbal cues      Exercises      General Comments General comments (skin integrity, edema, etc.): pt does not remember a representative from CIR speaking wiht her or CM speaking with her about d/c plan; pt declined therex this session but reviewed exercises with pt and encouraged her to practice them throughout the day      Pertinent Vitals/Pain Pain Assessment: Faces Faces Pain Scale: Hurts a little bit Pain Location: L UE and back of neck Pain Descriptors / Indicators: Sore;Aching Pain Intervention(s): Monitored during session;Premedicated before session;Repositioned    Home Living                      Prior Function            PT Goals (current goals can now be found in the care plan section) Acute Rehab PT Goals Patient Stated Goal: be more independent Progress towards PT goals: Progressing toward goals    Frequency  Min 4X/week    PT Plan Current plan remains appropriate    Co-evaluation  End of Session Equipment Utilized During Treatment: Gait belt;Cervical collar Activity Tolerance: Patient tolerated treatment well Patient left: with call bell/phone within reach;in chair;with chair alarm set     Time: [id number] PT Time Calculation (min) (ACUTE ONLY): 15 min  Charges:  $Gait Training: 8-22 mins                    G Codes:      Derek MoundKellyn R Barbar Brede Yarnell Kozloski, PTA Pager: [phone]   09/17/2015, 1:00 PM

## 2015-09-17 NOTE — Discharge Summary (Signed)
Patient ID: Lindsey Melton MRN: [id number] DOB/AGE: [birth year] 65 y.o.  Admit date: 09/10/2015 Discharge date: 09/17/2015  Admission Diagnoses:  Active Problems:   Myelopathy (HCC)   Respiratory failure (HCC)   S/P cervical spinal fusion   Surgery, elective   Postprocedural hypotension   Benign essential HTN   DM type 2 with diabetic peripheral neuropathy (HCC)   Chronic obstructive pulmonary disease (HCC)   Post-operative pain   Acute blood loss anemia   Dysphagia   Cervical myelopathy with cervical radiculopathy   Discharge Diagnoses:  Active Problems:   Myelopathy (HCC)   Respiratory failure (HCC)   S/P cervical spinal fusion   Surgery, elective   Postprocedural hypotension   Benign essential HTN   DM type 2 with diabetic peripheral neuropathy (HCC)   Chronic obstructive pulmonary disease (HCC)   Post-operative pain   Acute blood loss anemia   Dysphagia   Cervical myelopathy with cervical radiculopathy  status post Procedure(s): ANTERIOR C3-4 DECOMPRESSION AND FUSION POSTERIOR CERVICAL FUSION/FORAMINOTOMY Cervical 3-4  Past Medical History  Diagnosis Date  . Hypertension   . Diabetes mellitus   . Asthma   . COPD (chronic obstructive pulmonary disease) (HCC)   . Peripheral vertigo   . Headache   . Anxiety   . Arthritis   . Depression   . Hyperlipidemia   . Neuromuscular disorder (HCC)   . Substance abuse   . Wears glasses   . Kidney stones   . History of hiatal hernia   . Cervical myelopathy (HCC)   . Cataract     Surgeries: Procedure(s): ANTERIOR C3-4 DECOMPRESSION AND FUSION POSTERIOR CERVICAL FUSION/FORAMINOTOMY Cervical 3-4 on 09/10/2015   Consultants:    Discharged Condition: Improved  Hospital Course: Lindsey Melton is an 65 y.o. female who was admitted 09/10/2015 for operative treatment of cervical myelopathy. Patient failed conservative treatments (please see the history and physical for the specifics) and had severe unremitting pain that  affects sleep, daily activities and work/hobbies. After pre-op clearance, the patient was taken to the operating room on 09/10/2015 and underwent  Procedure(s): ANTERIOR C3-4 DECOMPRESSION AND FUSION POSTERIOR CERVICAL FUSION/FORAMINOTOMY Cervical 3-4.    Patient was given perioperative antibiotics: Anti-infectives    Start     Dose/Rate Route Frequency Ordered Stop   09/10/15 2200  ceFAZolin (ANCEF) IVPB 1 g/50 mL premix     1 g 100 mL/hr over 30 Minutes Intravenous Every 8 hours 09/10/15 1959 09/11/15 0545   09/10/15 0945  ceFAZolin (ANCEF) IVPB 2 g/50 mL premix     2 g 100 mL/hr over 30 Minutes Intravenous To ShortStay Surgical 09/10/15 0939 09/10/15 1603   09/10/15 0942  ceFAZolin (ANCEF) 2-3 GM-% IVPB SOLR    Comments:  Scronce, Trina   : cabinet override      09/10/15 0942 09/10/15 2159       Patient was given sequential compression devices and early ambulation to prevent DVT.   Patient benefited maximally from hospital stay and there were no complications. At the time of discharge, the patient was urinating/moving their bowels without difficulty, tolerating a regular diet, pain is controlled with oral pain medications and they have been cleared by PT/OT.   Recent vital signs: Patient Vitals for the past 24 hrs:  BP Temp Temp src Pulse Resp SpO2  09/17/15 0840 - - - - - 99 %  09/17/15 0434 (!) 145/91 mmHg 98.7 F (37.1 C) Oral 76 16 98 %  09/16/15 2023 140/84 mmHg 97.6 F (36.4 C)  Oral 87 16 100 %  09/16/15 1453 (!) 170/97 mmHg 99.3 F (37.4 C) Oral 69 18 96 %     Recent laboratory studies: No results for input(s): WBC, HGB, HCT, PLT, NA, K, CL, CO2, BUN, CREATININE, GLUCOSE, INR, CALCIUM in the last 72 hours.  Invalid input(s): PT, 2   Discharge Medications:     Medication List    STOP taking these medications        BC HEADACHE PO     naproxen 500 MG tablet  Commonly known as:  NAPROSYN     pregabalin 75 MG capsule  Commonly known as:  LYRICA     sodium  chloride 1 g tablet      TAKE these medications        albuterol 108 (90 Base) MCG/ACT inhaler  Commonly known as:  PROVENTIL HFA;VENTOLIN HFA  Inhale 2 puffs into the lungs every 4 (four) hours as needed for wheezing or shortness of breath.     aspirin EC 81 MG tablet  Take 81 mg by mouth daily.     atorvastatin 10 MG tablet  Commonly known as:  LIPITOR  Take 1 tablet (10 mg total) by mouth daily.     busPIRone 10 MG tablet  Commonly known as:  BUSPAR  Take 15 mg by mouth 3 (three) times daily.     CALCIUM 1200+D3 PO  Take 1 tablet by mouth 2 (two) times daily.     Fish Oil 1000 MG Caps  Take 1,000 mg by mouth 2 (two) times daily.     fluticasone-salmeterol 115-21 MCG/ACT inhaler  Commonly known as:  ADVAIR HFA  Inhale 2 puffs into the lungs 2 (two) times daily.     insulin glargine 100 UNIT/ML injection  Commonly known as:  LANTUS  Inject 15 Units into the skin at bedtime.     lisinopril 10 MG tablet  Commonly known as:  PRINIVIL,ZESTRIL  Take 20 mg by mouth daily.     methocarbamol 500 MG tablet  Commonly known as:  ROBAXIN  Take 1 tablet (500 mg total) by mouth 3 (three) times daily as needed for muscle spasms.     omeprazole 20 MG tablet  Commonly known as:  PRILOSEC OTC  Take 20 mg by mouth daily.     ondansetron 4 MG tablet  Commonly known as:  ZOFRAN  Take 1 tablet (4 mg total) by mouth every 8 (eight) hours as needed for nausea or vomiting.     oxyCODONE-acetaminophen 10-325 MG tablet  Commonly known as:  PERCOCET  Take 1 tablet by mouth every 4 (four) hours as needed for pain.     topiramate 100 MG tablet  Commonly known as:  TOPAMAX  Take 1 tablet (100 mg total) by mouth 2 (two) times daily.     venlafaxine 75 MG tablet  Commonly known as:  EFFEXOR  Take 75 mg by mouth daily.     VISTARIL PO  Take 25 mg by mouth 2 (two) times daily.        Diagnostic Studies: Dg Chest 2 View  09/02/2015  CLINICAL DATA:  Preoperative examination prior  cervical spine fusion. History of diabetes, asthma-COPD. EXAM: CHEST  2 VIEW COMPARISON:  None in PACs FINDINGS: The lungs are adequately inflated and clear. The heart and pulmonary vascularity are normal. The mediastinum is normal in width. There is mild tortuosity of the descending thoracic aorta. There is mild multilevel degenerative disc disease. There is gentle levo curvature of  the thoracic spine centered at approximately T3. IMPRESSION: There is no active cardiopulmonary disease. Electronically Signed   By: David  [place] M.D.   On: 09/02/2015 12:21   Dg Cervical Spine 2 Or 3 Views  09/11/2015  CLINICAL DATA:  Status post cervical fusion EXAM: CERVICAL SPINE - 2-3 VIEW COMPARISON:  Intraoperative views of September 10, 2015 FINDINGS: Posterior fusion and interbody fusion devices have been placed at C3-4. There is no radiographic evidence of postprocedure complication. The trachea is intubated. The prevertebral soft tissue spaces appear normal. The endotracheal tube tip lies approximately 3.8 cm above the carina. IMPRESSION: Stable postsurgical changes centered at C3-4 without evidence of complication. Electronically Signed   By: David  [place] M.D.   On: 09/11/2015 07:58   Dg Cervical Spine 2-3 Views  09/10/2015  CLINICAL DATA:  Intraoperative imaging.  C3-4 posterior fusion. EXAM: CERVICAL SPINE - 2-3 VIEW; DG C-ARM 61-120 MIN COMPARISON:  None. FINDINGS: We are provided with 2 fluoroscopic spot views of the cervical spine. Images demonstrate pedicle screws and stabilization bars in place at C3-4. The patient is status post prior anterior discectomy and fusion at this level. No acute abnormality. IMPRESSION: C3-4 posterior fusion. Electronically Signed   By: Drusilla Kanner M.D.   On: 09/10/2015 18:21   Dg Chest Port 1 View  09/12/2015  CLINICAL DATA:  Respiratory failure, while op with a, history of COPD, former smoker. EXAM: PORTABLE CHEST 1 VIEW COMPARISON:  Portable chest x-ray of September 11, 2015 FINDINGS:  The lungs are reasonably well inflated. There is hazy increased density at both lung bases more conspicuous today. Traces of pleural fluid blunt the costophrenic angles. The endotracheal tube tip lies approximately 4.5 cm above the carina. The esophagogastric tube tip projects below the inferior margin of the image. The heart is normal in size. The pulmonary vascularity is not engorged. IMPRESSION: Slight interval increase in bibasilar atelectasis. A trace of pleural fluid is suspected especially on the right. The support tubes are in reasonable position. Electronically Signed   By: David  [place] M.D.   On: 09/12/2015 07:46   Portable Chest Xray  09/11/2015  CLINICAL DATA:  Respiratory failure. EXAM: PORTABLE CHEST 1 VIEW COMPARISON:  09/10/2015. FINDINGS: Endotracheal tube in stable position. Mediastinum and hilar structures normal. Low lung volumes with mild bibasilar atelectasis and/or infiltrates. No pleural effusion or pneumothorax. Heart size normal. IMPRESSION: 1. Endotracheal tube in stable position. 2. Low lung volumes with with persistent mild bibasilar atelectasis and/or infiltrates. Electronically Signed   By: Maisie Fus  Register   On: 09/11/2015 08:00   Dg Chest Port 1 View  09/10/2015  CLINICAL DATA:  65 year old female with history of respiratory failure. EXAM: PORTABLE CHEST 1 VIEW COMPARISON:  Chest x-ray 06/30/2015. FINDINGS: Endotracheal tube in position with tip 2.9 cm above the carina. Patchy interstitial and airspace opacities throughout the mid to lower lungs bilaterally. Low lung volumes. No pleural effusions. No evidence of pulmonary edema. Heart size is normal. Upper mediastinal contours are within normal limits. IMPRESSION: 1. Endotracheal tube tip 2.9 cm above the carina. 2. Low lung volumes with ill-defined interstitial and airspace opacities throughout the mid to lower lungs bilaterally, which could reflect sequela of recent aspiration or developing infection. Electronically Signed    By: Trudie Reed M.D.   On: 09/10/2015 21:37   Dg Abd Portable 1v  09/11/2015  CLINICAL DATA:  Z93.1 (ICD-10-CM) - Feeding by G-tube (HCC) EXAM: PORTABLE ABDOMEN - 1 VIEW COMPARISON:  None. FINDINGS: Nasogastric tube has been  placed, tip overlying the level of the distal stomach. Bowel gas pattern is nonobstructed. IMPRESSION: Nasogastric tube tip overlying the distal stomach. Electronically Signed   By: Norva Pavlov M.D.   On: 09/11/2015 10:00   Dg Swallowing Func-speech Pathology  09/16/2015  Objective Swallowing Evaluation: Type of Study: MBS-Modified Barium Swallow Study Patient Details Name: Denae Zulueta MRN: [id number] Date of Birth: Sep 04, 1950 Today's Date: 09/16/2015 Time: SLP Start Time (ACUTE ONLY): 1311-SLP Stop Time (ACUTE ONLY): 1329 SLP Time Calculation (min) (ACUTE ONLY): 18 min Past Medical History: Past Medical History Diagnosis Date . Hypertension  . Diabetes mellitus  . Asthma  . COPD (chronic obstructive pulmonary disease) (HCC)  . Peripheral vertigo  . Headache  . Anxiety  . Arthritis  . Depression  . Hyperlipidemia  . Neuromuscular disorder (HCC)  . Substance abuse  . Wears glasses  . Kidney stones  . History of hiatal hernia  . Cervical myelopathy (HCC)  . Cataract  Past Surgical History: Past Surgical History Procedure Laterality Date . Abdominal hysterectomy   . Appendectomy   . Anterior cervical decomp/discectomy fusion N/A 09/10/2015   Procedure: ANTERIOR C3-4 DECOMPRESSION AND FUSION;  Surgeon: Venita Lick, MD;  Location: [facility] OR;  Service: Orthopedics;  Laterality: N/A; . Posterior cervical fusion/foraminotomy N/A 09/10/2015   Procedure: POSTERIOR CERVICAL FUSION/FORAMINOTOMY Cervical 3-4;  Surgeon: Venita Lick, MD;  Location: [facility] OR;  Service: Orthopedics;  Laterality: N/A; HPI: Pt admitted for ACDF C3-4. Intubated 3/1-3/3 due to upper airway edema. PMH: COPD, HTN, HLD, vertigo, anxiety and depression. Subjective: pt eager to eat/drink, believes her swallowing is improving  Assessment / Plan / Recommendation CHL IP CLINICAL IMPRESSIONS 09/16/2015 Therapy Diagnosis Mild pharyngeal phase dysphagia Clinical Impression Pt has a mild pharyngeal dysphagia with reduced base of tongue retraction and hyolaryngeal excursion that leads to moderate residue after the swallow. Residue is primarily contained within the valleculae and is reduced with spontaneous dry swallows. Trace penetration occurred x2 with thin liquid cup sips, but was cleared easily with cued throat clearing. The remaining trials of thin liquids did not result in airway compromise. Given the amount of residue and the intermittent penetration of thin liquids, would start a Dys 2 diet and thin liquids by cup only. Will continue to follow and assess for progress. Impact on safety and function Mild aspiration risk;Moderate aspiration risk   CHL IP TREATMENT RECOMMENDATION 09/16/2015 Treatment Recommendations Therapy as outlined in treatment plan below   Prognosis 09/16/2015 Prognosis for Safe Diet Advancement Good Barriers to Reach Goals -- Barriers/Prognosis Comment -- CHL IP DIET RECOMMENDATION 09/16/2015 SLP Diet Recommendations Dysphagia 2 (Fine chop) solids;Thin liquid Liquid Administration via Cup;No straw Medication Administration Whole meds with puree Compensations Minimize environmental distractions;Slow rate;Small sips/bites;Multiple dry swallows after each bite/sip;Clear throat intermittently Postural Changes Seated upright at 90 degrees;Remain semi-upright after after feeds/meals (Comment)   CHL IP OTHER RECOMMENDATIONS 09/16/2015 Recommended Consults -- Oral Care Recommendations Oral care BID Other Recommendations --   CHL IP FOLLOW UP RECOMMENDATIONS 09/16/2015 Follow up Recommendations Inpatient Rehab   CHL IP FREQUENCY AND DURATION 09/16/2015 Speech Therapy Frequency (ACUTE ONLY) min 2x/week Treatment Duration 2 weeks      CHL IP ORAL PHASE 09/16/2015 Oral Phase WFL Oral - Pudding Teaspoon -- Oral - Pudding Cup -- Oral - Honey  Teaspoon -- Oral - Honey Cup -- Oral - Nectar Teaspoon -- Oral - Nectar Cup -- Oral - Nectar Straw -- Oral - Thin Teaspoon -- Oral - Thin Cup -- Oral - Thin Straw -- Oral -  Puree -- Oral - Mech Soft -- Oral - Regular -- Oral - Multi-Consistency -- Oral - Pill -- Oral Phase - Comment --  CHL IP PHARYNGEAL PHASE 09/16/2015 Pharyngeal Phase Impaired Pharyngeal- Pudding Teaspoon -- Pharyngeal -- Pharyngeal- Pudding Cup -- Pharyngeal -- Pharyngeal- Honey Teaspoon -- Pharyngeal -- Pharyngeal- Honey Cup -- Pharyngeal -- Pharyngeal- Nectar Teaspoon -- Pharyngeal -- Pharyngeal- Nectar Cup -- Pharyngeal -- Pharyngeal- Nectar Straw -- Pharyngeal -- Pharyngeal- Thin Teaspoon Reduced anterior laryngeal mobility;Reduced tongue base retraction;Pharyngeal residue - valleculae Pharyngeal -- Pharyngeal- Thin Cup Reduced anterior laryngeal mobility;Reduced tongue base retraction;Pharyngeal residue - valleculae;Penetration/Aspiration during swallow Pharyngeal Material enters airway, remains ABOVE vocal cords and not ejected out Pharyngeal- Thin Straw -- Pharyngeal -- Pharyngeal- Puree Reduced anterior laryngeal mobility;Reduced tongue base retraction;Pharyngeal residue - valleculae Pharyngeal -- Pharyngeal- Mechanical Soft Reduced anterior laryngeal mobility;Reduced tongue base retraction;Pharyngeal residue - valleculae Pharyngeal -- Pharyngeal- Regular -- Pharyngeal -- Pharyngeal- Multi-consistency -- Pharyngeal -- Pharyngeal- Pill -- Pharyngeal -- Pharyngeal Comment --  CHL IP CERVICAL ESOPHAGEAL PHASE 09/16/2015 Cervical Esophageal Phase WFL Pudding Teaspoon -- Pudding Cup -- Honey Teaspoon -- Honey Cup -- Nectar Teaspoon -- Nectar Cup -- Nectar Straw -- Thin Teaspoon -- Thin Cup -- Thin Straw -- Puree -- Mechanical Soft -- Regular -- Multi-consistency -- Pill -- Cervical Esophageal Comment -- No flowsheet data found. Maxcine HamLaura Paiewonsky, M.A. CCC-SLP [phone] Maxcine Hamaiewonsky, Laura 09/16/2015, 2:14 PM              Dg C-arm 61-120  Min  09/10/2015  CLINICAL DATA:  Intraoperative imaging.  C3-4 posterior fusion. EXAM: CERVICAL SPINE - 2-3 VIEW; DG C-ARM 61-120 MIN COMPARISON:  None. FINDINGS: We are provided with 2 fluoroscopic spot views of the cervical spine. Images demonstrate pedicle screws and stabilization bars in place at C3-4. The patient is status post prior anterior discectomy and fusion at this level. No acute abnormality. IMPRESSION: C3-4 posterior fusion. Electronically Signed   By: Drusilla Kannerhomas  Dalessio M.D.   On: 09/10/2015 18:21          Follow-up Information    Follow up with Alvy BealBROOKS,Dondrell Loudermilk D, MD. Schedule an appointment as soon as possible for a visit in 1 week.   Specialty:  Orthopedic Surgery   Why:  For suture removal, For wound re-check   Contact information:   [address] [place] [address] [phone]       Discharge Plan:  discharge to SNF  Disposition: Improving neuro exam.  Able to ambulate 100 ft which is significant improvement given the fact she was wheelchair bound for last 6-8 weeks.  Agree with SNF for further rehab and monitoring.   F/u with me in the office in 1 week for wound check Incentive spirometer at least 10 deep breaths per hour      Signed: Venita LickBROOKS,Stefano Trulson D for Dr. Venita Lickahari Pamelia Botto [facility] [phone] 09/17/2015, 12:32 PM

## 2015-09-17 NOTE — Progress Notes (Signed)
Speech Language Pathology Treatment: Dysphagia  Patient Details Name: Lindsey Melton MRN: [id number] DOB: 1951/05/29 Today's Date: 09/17/2015 Time: [id number] SLP Time Calculation (min) (ACUTE ONLY): 11 min  Assessment / Plan / Recommendation Clinical Impression  Pt voiced good tolerance of meals since MBS on previous date, although she does report that her swallowing has still not returned to baseline. SLP provided trials of Dys 2 textures and thin liquids via cup sip with multiple swallows and occasional throat clearing noted. Per MBS, throat clearing was effective to clear trace, intermittent penetrates that resulted from larger sips of thin liquids. Her vocal quality was wet x1, and elicited an immediate throat clear to return it to baseline. Recommend to continue with current diet textures.   HPI HPI: Pt admitted for ACDF C3-4. Intubated 3/1-3/3 due to upper airway edema. PMH: COPD, HTN, HLD, vertigo, anxiety and depression.      SLP Plan  Continue with current plan of care     Recommendations  Diet recommendations: Dysphagia 2 (fine chop);Thin liquid Liquids provided via: Cup;No straw Medication Administration: Crushed with puree Supervision: Patient able to self feed;Full supervision/cueing for compensatory strategies Compensations: Minimize environmental distractions;Slow rate;Small sips/bites;Multiple dry swallows after each bite/sip;Clear throat intermittently Postural Changes and/or Swallow Maneuvers: Seated upright 90 degrees;Upright 30-60 min after meal             Oral Care Recommendations: Oral care BID Follow up Recommendations: Inpatient Rehab Plan: Continue with current plan of care     GO               Maxcine HamLaura Paiewonsky, M.A. CCC-SLP [phone]  Maxcine Hamaiewonsky, Mattie Novosel 09/17/2015, 4:10 PM

## 2015-09-17 NOTE — Progress Notes (Signed)
    Subjective: Procedure(s) (LRB): ANTERIOR C3-4 DECOMPRESSION AND FUSION (N/A) POSTERIOR CERVICAL FUSION/FORAMINOTOMY Cervical 3-4 (N/A) 7 Days Post-Op  Patient reports pain as 3 on 0-10 scale.  Reports none arm pain reports incisional neck pain   Positive void Negative bowel movement Positive flatus Negative chest pain or shortness of breath  Objective: Vital signs in last 24 hours: Temp:  [97.6 F (36.4 C)-99.3 F (37.4 C)] 98.7 F (37.1 C) (03/08 0434) Pulse Rate:  [69-87] 76 (03/08 0434) Resp:  [16-18] 16 (03/08 0434) BP: (140-170)/(84-97) 145/91 mmHg (03/08 0434) SpO2:  [96 %-100 %] 99 % (03/08 0840)  Intake/Output from previous day:    Labs: No results for input(s): WBC, RBC, HCT, PLT in the last 72 hours. No results for input(s): NA, K, CL, CO2, BUN, CREATININE, GLUCOSE, CALCIUM in the last 72 hours. No results for input(s): LABPT, INR in the last 72 hours.  Physical Exam: ABD soft Intact pulses distally Incision: dressing C/D/I Compartment soft neuro exam stable.  Ambulating with assistance  Assessment/Plan: Patient stable  Mobilization with physical therapy Encourage incentive spirometry Continue care  Ambulated for the 1st time with PT in 2 months Pleased with progress given extent of myelopathy pre-op Discharge to SNF    Venita Lickahari Fallynn Gravett, MD [facility] [phone]

## 2015-09-17 NOTE — Progress Notes (Signed)
65 year old with history of diabetes, hypertension and COPD who presented on 3/1 the cervical myelopathy who underwent C3-4 decompression and fusion. Stable, complains of constipation with no bowel movement times several days.  Constipation: Have started MiraLAX Cervical decompression and fusion: For skilled nursing. Discharge summary completed by orthopedic surgery. Patient initially for discharge on 3/, but awaiting authorization and, hopefully will go tomorrow.

## 2015-09-17 NOTE — Discharge Instructions (Signed)
° ° ° ° ° °Today you will be discharged from the hospital.  The purpose of the following handout is to help guide you over the next 2 weeks.  First and foremost, be sure you have a follow up appointment with Dr. Jermane Brayboy 2 weeks from the time of your surgery to have your sutures removed.  Please call [facility] [phone] to schedule or confirm this appointment.   ° ° ° °Brace °You do not have to wear the collar while lying in bed or sitting in a high-backed chair, eating, sleeping or showering.  Other than these instances, you must wear the brace.  You may NOT wear the collar while driving a vehicle (see driving restrictions below).  It is advisable that you wear the collar in public places or while traveling in a car as a passenger.  Dr. Keneth Borg will discuss further use of the collar at your 2 week postop visit. ° °Wound Care °You may SHOWER 5 days from the date of surgery.  Shower directly over the steri-strips.  DO NOT scrub or submerge (bath tub, swimming pool, hot tub, etc.) the area.  Pat to dry following your shower.  There is no need for additional dressings other than the steri-strips.  Allow the steri-strips to fall off on their own.  Once the strips have fallen off, you may leave the area undressed.  DO NOT apply lotion/cream/ointment to the area.  The wound must remain dry at all times other than while showering.  Dr. Berdell Nevitt or his staff will remove your stiches at your first postop visit and give you additional instructions regarding wound care at that time.  ° °Activity °NO DRIVING FOR 2 WEEKS.  No lifting over 5 pounds (approximately a gallon of milk).  No bending, stooping, squatting or twisting.  No overhead activities.  We encourage you to walk (short distances and often throughout the day) as you can tolerate.  A good rule of thumb is to get up and move once or twice every hour.  You may go up and down stairs carefully.  As you continue to recover, Dr. Oree Mirelez will address and  adjust restrictions to your activities until no further restrictions are needed.  However, until your first postop visit, when Dr. Dalante Minus can assess your recovery, you are to follow these instructions.  At the end of this document is a tentative outline of activities for up to 1 year.   ° ° ° ° °Medication °You will be discharged from the hospital with medication for pain, spasm, nausea and constipation.  You will be given enough medication to last until your first postop visit in 2 weeks.  Medications WILL NOT BE REFILLED EARLY; therefore, you are to take the medications only as directed.  If you have been given multiple prescriptions, please leave them with your pharmacy.  They can keep them on file for when you need them.  Medications that are lost or stolen WILL NOT be replaced.  We will address the need for continuing certain medications on an individual basis during your postop visit.  We ask that you avoid over the counter anti-inflammatory medications (Advil, Aleve, Motrin) for 3 months.   ° °What you can expect following neck surgery... °It is not uncommon to experience a sore throat or difficulty swallowing following neck surgery.  Cold liquids and soft foods are helpful in soothing this discomfort.  There is no specific diet that you are to follow after surgery, however, there are a   few things you should keep in mind to avoid unneeded discomfort.  Take small bites and eat slowly.  Chew your food thoroughly before swallowing.   It is not uncommon to experience incisional soreness or pain in the back of the neck, shoulders or between the shoulder blades.  These symptoms will slowly begin to resolve as you continue to recover, however, they can last for a few weeks.    It is not uncommon to experience INTERMITTENT arm pain following surgery.  This pain can mimic the arm pain you had prior to surgery.  As long as the pain resolves on its own and is not constant, there is no need to become alarmed.    When To Call If you experience fever >101F, loss of bowel or bladder control, painful swelling in the lower extremities, constant (unresolving) arm pain.  If you experience any of these symptoms, please call [facility] [phone].  What's Next As mentioned earlier, you will follow up with Dr. Shon BatonBrooks in 2 weeks.  At that time, we will likely remove your stitches and discuss additional aspects of your recovery.                   ACTIVITY GUIDELINES ANTERIOR CERVICAL DISECTOMY AND FUSION  Activity Discharge 2 weeks 6 weeks 3 months 6 months 1 year  Shower 5 days        Submerge the wound  no no yes     Walking outdoors yes       Lifting 5 lbs yes       Climbing stairs yes       Cooking yes       Car rides (less than 30 minutes) yes       Car rides (greater than 30 minutes) no varies yes     Air travel no varies yes     Short outings [employer](church, visits, etc...) yes       School no no yes     Driving a car no no varies yes    Light upper extremity exercises no no varies yes    Stationary bike no no yes     Swimming (no diving) no no no varies yes   Vacuuming, laundry, mopping no no no varies yes   Biking outdoors no no no no varies yes  Light jogging no no no varies yes   Low impact aerobics no no no varies yes   Non-contact sports (tennis, golf) no no no varies yes   Hunting (no tree climbing) no no no varies yes   Dancing (non-gymnastics) no no no varies yes   Down-hill skiing (experienced skier) no no no no yes   Down-hill skiing (novice) no no no no yes   Cross-country skiing no no no no yes   Horseback riding (noncompetitive)  no no no no yes   Horseback riding (competitive) no no no no varies yes  Gardening/landscaping no no no varies yes   House repairs no no no varies varies yes  Lifting up to 50 lbs no no no no varies yes    Change dressing once a day - dry dressing Call for difficult breathing or swallowing Ok to remove collar to eat,  sleep, or shower Ok to shower Medical team to address diabetic medications

## 2015-09-17 NOTE — Progress Notes (Signed)
CSW was awaiting auth from [facility] [employer] [employer] informed CSW they are out of network and pt would have 60% copay   Pt dtr informed and stated this would be cost prohibitive for pt to go to out of network facility.  Pt dtr is agreeable to taking patient home with home health services if necessary but would feel more comfortable with pt in SNF.  Physician paged and does not think home is safe DC plan at this time.  Pt dtr agreeable to [facility] [name] for SN[facility]- they have initiated auth- hopeful to receive [place] tomorrow  CSW will continue to follow  Merlyn LotJenna Holoman, [facility] Clinical Social Worker [phone]

## 2015-09-18 LAB — GLUCOSE, CAPILLARY
Glucose-Capillary: 121 mg/dL — ABNORMAL HIGH (ref 65–99)
Glucose-Capillary: 141 mg/dL — ABNORMAL HIGH (ref 65–99)
Glucose-Capillary: 145 mg/dL — ABNORMAL HIGH (ref 65–99)
Glucose-Capillary: 158 mg/dL — ABNORMAL HIGH (ref 65–99)

## 2015-09-18 NOTE — Progress Notes (Addendum)
65 year old with history of diabetes, hypertension and COPD who presented on 3/1 the cervical myelopathy who underwent C3-4 decompression and fusion.   Today doing okay. Did not move her bowels left her 1 dose of MiraLAX. Did not want to try anything stronger and is hoping his second dose will help.  Cervical decompression and fusion: For skilled nursing. Discharge summary completed by orthopedic surgery on 3/8. Patient initially for discharge on 3/8, but approval came in today.  No changes to yesterday's DC summary.

## 2015-09-18 NOTE — Progress Notes (Signed)
Patient is going to be [facility] [place].  I called report and spoke to GuineaJacey.  Zelphia CairoMary R Amarra Sawyer 2:07 PM 09/18/2015

## 2015-09-18 NOTE — Clinical Social Work Note (Signed)
Patient to be d/c'ed today to [facility] in [name].  Patient and family agreeable to plans will transport via ems RN to call report to [phone].  Lindsey Melton, MSW, LCSWA [phone]  

## 2015-10-10 ENCOUNTER — Encounter: Payer: Self-pay | Admitting: Family Medicine

## 2015-10-10 ENCOUNTER — Ambulatory Visit (INDEPENDENT_AMBULATORY_CARE_PROVIDER_SITE_OTHER): Payer: BLUE CROSS/BLUE SHIELD | Admitting: Family Medicine

## 2015-10-10 VITALS — BP 109/79 | HR 101 | Temp 97.5°F | Ht 61.0 in | Wt 133.4 lb

## 2015-10-10 DIAGNOSIS — H6121 Impacted cerumen, right ear: Secondary | ICD-10-CM | POA: Diagnosis not present

## 2015-10-10 NOTE — Progress Notes (Signed)
BP 109/79 mmHg  Pulse 101  Temp(Src) 97.5 F (36.4 C) (Oral)  Ht 5\' 1"  (1.549 m)  Wt 133 lb 6.4 oz (60.51 kg)  BMI 25.22 kg/m2   Subjective:    Patient ID: Lindsey DrossNancy Ferre, female    DOB: 04-Nov-1950, 65 y.o.   MRN: [id number]  HPI: Lindsey Drossancy Melton is a 65 y.o. female presenting on 10/10/2015 for Difficulty hearing and Sinusitis   HPI Ear congestion and difficulty hearing on the right Patient is coming in today because she is having difficulty hearing on the right side and having some sinus congestion but not extensively. She just feels like her hearing is muffled on that side. She does have some drainage and wax coming out of that ear. She denies any fevers or chills or shortness of breath or wheezing. She denies having nasal drainage or postnasal drainage currently.  Relevant past medical, surgical, family and social history reviewed and updated as indicated. Interim medical history since our last visit reviewed. Allergies and medications reviewed and updated.  Review of Systems  Constitutional: Negative for fever and chills.  HENT: Positive for ear discharge, hearing loss and sinus pressure. Negative for congestion and ear pain.   Eyes: Negative for redness and visual disturbance.  Respiratory: Negative for chest tightness and shortness of breath.   Cardiovascular: Negative for chest pain and leg swelling.  Genitourinary: Negative for dysuria and difficulty urinating.  Musculoskeletal: Negative for back pain and gait problem.  Skin: Negative for rash.  Neurological: Negative for dizziness, light-headedness and headaches.  Psychiatric/Behavioral: Negative for behavioral problems and agitation.  All other systems reviewed and are negative.   Per HPI unless specifically indicated above     Medication List       This list is accurate as of: 10/10/15  2:31 PM.  Always use your most recent med list.               albuterol 108 (90 Base) MCG/ACT inhaler  Commonly known as:   PROVENTIL HFA;VENTOLIN HFA  Inhale 2 puffs into the lungs every 4 (four) hours as needed for wheezing or shortness of breath.     aspirin EC 81 MG tablet  Take 81 mg by mouth daily.     atorvastatin 10 MG tablet  Commonly known as:  LIPITOR  Take 1 tablet (10 mg total) by mouth daily.     busPIRone 10 MG tablet  Commonly known as:  BUSPAR  Take 15 mg by mouth 3 (three) times daily.     CALCIUM 1200+D3 PO  Take 1 tablet by mouth 2 (two) times daily.     Fish Oil 1000 MG Caps  Take 1,000 mg by mouth 2 (two) times daily.     fluticasone-salmeterol 115-21 MCG/ACT inhaler  Commonly known as:  ADVAIR HFA  Inhale 2 puffs into the lungs 2 (two) times daily.     hydrOXYzine 25 MG tablet  Commonly known as:  ATARAX/VISTARIL  Take 25 mg by mouth. One tablet every morning, two at bedtime.     insulin glargine 100 UNIT/ML injection  Commonly known as:  LANTUS  Inject 15 Units into the skin at bedtime.     lisinopril 10 MG tablet  Commonly known as:  PRINIVIL,ZESTRIL  Take 20 mg by mouth daily.     naproxen 500 MG tablet  Commonly known as:  NAPROSYN  Take 500 mg by mouth 2 (two) times daily with a meal.     omeprazole 20 MG tablet  Commonly known as:  PRILOSEC OTC  Take 20 mg by mouth daily.     topiramate 100 MG tablet  Commonly known as:  TOPAMAX  Take 1 tablet (100 mg total) by mouth 2 (two) times daily.     venlafaxine 75 MG tablet  Commonly known as:  EFFEXOR  Take 75 mg by mouth daily.     VISTARIL PO  Take 25 mg by mouth 2 (two) times daily.           Objective:    BP 109/79 mmHg  Pulse 101  Temp(Src) 97.5 F (36.4 C) (Oral)  Ht  (1.549 m)  Wt 133 lb 6.4 oz (60.51 kg)  BMI 25.22 kg/m2  Wt Readings from Last 3 Encounters:  10/10/15 133 lb 6.4 oz (60.51 kg)  09/11/15 146 lb 9.7 oz (66.5 kg)  09/03/15 137 lb 3.2 oz (62.234 kg)    Physical Exam  Constitutional: She is oriented to person, place, and time. She appears well-developed and  well-nourished. No distress.  HENT:  Right Ear: There is drainage (Cerumen impaction after cleared with washing tympanic membrane and canal are normal.). Tympanic membrane is not injected and not scarred. Decreased hearing is noted.  Left Ear: Hearing, tympanic membrane and ear canal normal. No drainage. No decreased hearing is noted.  Nose: Nose normal.  Mouth/Throat: Oropharynx is clear and moist and mucous membranes are normal. No oropharyngeal exudate.  Eyes: Conjunctivae and EOM are normal. Pupils are equal, round, and reactive to light.  Neck: Neck supple. No thyromegaly present.  Cardiovascular: Normal rate, regular rhythm, normal heart sounds and intact distal pulses.   No murmur heard. Pulmonary/Chest: Effort normal and breath sounds normal. No respiratory distress. She has no wheezes.  Musculoskeletal: Normal range of motion. She exhibits no edema or tenderness.  Lymphadenopathy:    She has no cervical adenopathy.  Neurological: She is alert and oriented to person, place, and time. Coordination normal.  Skin: Skin is warm and dry. No rash noted. She is not diaphoretic.  Psychiatric: She has a normal mood and affect. Her behavior is normal.  Nursing note and vitals reviewed.   Cerumen disimpaction: Nurse was able to perform cerumen disimpaction with washing and clear out wax.    Assessment & Plan:   Problem List Items Addressed This Visit    None    Visit Diagnoses    Cerumen impaction, right    -  Primary        Follow up plan: Return if symptoms worsen or fail to improve.  Counseling provided for all of the vaccine components No orders of the defined types were placed in this encounter.    Arville Care, MD [facility] 10/10/2015, 2:31 PM

## 2015-11-25 ENCOUNTER — Ambulatory Visit: Payer: BLUE CROSS/BLUE SHIELD | Admitting: Family Medicine

## 2015-11-26 ENCOUNTER — Encounter: Payer: Self-pay | Admitting: Family Medicine

## 2015-12-15 ENCOUNTER — Ambulatory Visit: Payer: BLUE CROSS/BLUE SHIELD | Admitting: Family Medicine

## 2015-12-16 ENCOUNTER — Encounter: Payer: Self-pay | Admitting: Family Medicine

## 2015-12-26 ENCOUNTER — Ambulatory Visit (INDEPENDENT_AMBULATORY_CARE_PROVIDER_SITE_OTHER): Payer: BLUE CROSS/BLUE SHIELD | Admitting: Family Medicine

## 2015-12-26 VITALS — BP 132/85 | HR 103 | Temp 97.9°F | Ht 61.0 in | Wt 145.4 lb

## 2015-12-26 DIAGNOSIS — I10 Essential (primary) hypertension: Secondary | ICD-10-CM

## 2015-12-26 DIAGNOSIS — J449 Chronic obstructive pulmonary disease, unspecified: Secondary | ICD-10-CM

## 2015-12-26 DIAGNOSIS — D62 Acute posthemorrhagic anemia: Secondary | ICD-10-CM

## 2015-12-26 DIAGNOSIS — Z0000 Encounter for general adult medical examination without abnormal findings: Secondary | ICD-10-CM

## 2015-12-26 DIAGNOSIS — E1140 Type 2 diabetes mellitus with diabetic neuropathy, unspecified: Secondary | ICD-10-CM | POA: Diagnosis not present

## 2015-12-26 DIAGNOSIS — E1142 Type 2 diabetes mellitus with diabetic polyneuropathy: Secondary | ICD-10-CM | POA: Diagnosis not present

## 2015-12-26 DIAGNOSIS — Z794 Long term (current) use of insulin: Secondary | ICD-10-CM

## 2015-12-26 LAB — BAYER DCA HB A1C WAIVED: HB A1C (BAYER DCA - WAIVED): 6.9 % (ref <7.0)

## 2015-12-26 MED ORDER — ALBUTEROL SULFATE HFA 108 (90 BASE) MCG/ACT IN AERS: 2.0000 | INHALATION_SPRAY | RESPIRATORY_TRACT | Status: DC | PRN

## 2015-12-26 NOTE — Progress Notes (Signed)
   HPI  Patient presents today here for follow-up.  Patient states she is doing well. She complains of coughing fits. She states that they occasionally happen, they helped by albuterol. She feels albuterol makes it worse. She has had congestion, she's concerned about allergies, has not taken any medications.  COPD Taking albuterol on a daily basis, also using Advair. Denies smoking  Type 2 diabetes Average fasting blood sugar is 120-140 Stop glipizide now for about 2 weeks. No low blood sugars, Lowest that she has seen is 70  HTN Good medication compliance NO HA, Chest pain, palpitations, dyspnea (only coughing)  Had recent surgery, energy is good.     PMH: Smoking status noted ROS: Per HPI  Objective: BP 132/85 mmHg  Pulse 103  Temp(Src) 97.9 F (36.6 C) (Oral)  Ht '5\' 1"'$  (1.549 m)  Wt 145 lb 6.4 oz (65.953 kg)  BMI 27.49 kg/m2 Gen: NAD, alert, cooperative with exam HEENT: NCAT CV: RRR, good S1/S2, no murmur Resp: CTABL, no wheezes, non-labored Ext: No edema, warm Neuro: Alert and oriented, No gross deficits  Assessment and plan:  # Type 2 diabetes Stopping glipizide A1c pending- 6.9, controlled Continue Lantus at 15 units daily  # Hypertension Well-controlled Continue lisinopril  # COPD Not well controlled, refilled albuterol for now, discussed good compliance with Advair Consider increasing Advair versus changing to other daily inhaled steroid/LABA   Anemia CBC  HCM Declines C scope and mammo, FOBT given Hep C   Orders Placed This Encounter  Procedures  . Bayer DCA Hb A1c Waived  . CMP14+EGFR  . CBC with Differential/Platelet    Meds ordered this encounter  Medications  . albuterol (PROVENTIL HFA;VENTOLIN HFA) 108 (90 Base) MCG/ACT inhaler    Sig: Inhale 2 puffs into the lungs every 4 (four) hours as needed for wheezing or shortness of breath.    Dispense:  1 Inhaler    Refill:  Patrick, MD [place] 12/26/2015, 2:11 PM

## 2015-12-26 NOTE — Patient Instructions (Addendum)
Great to see you!  Lets stop glipizide, we will manage you with only insulin for now  We will will call with labs within 1 week  PLease come back in 1 month to discuss COPD, I recommend restarting advair

## 2015-12-27 LAB — CMP14+EGFR
ALT: 11 IU/L (ref 0–32)
AST: 11 IU/L (ref 0–40)
Albumin/Globulin Ratio: 1.8 (ref 1.2–2.2)
Albumin: 4.3 g/dL (ref 3.6–4.8)
Alkaline Phosphatase: 123 IU/L — ABNORMAL HIGH (ref 39–117)
BUN/Creatinine Ratio: 22 (ref 12–28)
BUN: 22 mg/dL (ref 8–27)
Bilirubin Total: <0.2 mg/dL (ref 0.0–1.2)
CO2: 19 mmol/L (ref 18–29)
Calcium: 9.8 mg/dL (ref 8.7–10.3)
Chloride: 104 mmol/L (ref 96–106)
Creatinine, Ser: 1 mg/dL (ref 0.57–1.00)
GFR calc Af Amer: 69 mL/min/{1.73_m2} (ref >59)
GFR calc non Af Amer: 60 mL/min/{1.73_m2} (ref >59)
Globulin, Total: 2.4 g/dL (ref 1.5–4.5)
Glucose: 184 mg/dL — ABNORMAL HIGH (ref 65–99)
Potassium: 4.5 mmol/L (ref 3.5–5.2)
Sodium: 143 mmol/L (ref 134–144)
Total Protein: 6.7 g/dL (ref 6.0–8.5)

## 2015-12-27 LAB — CBC WITH DIFFERENTIAL/PLATELET
Basophils Absolute: 0.1 10*3/uL (ref 0.0–0.2)
Basos: 1 %
EOS (ABSOLUTE): 0.9 10*3/uL — ABNORMAL HIGH (ref 0.0–0.4)
Eos: 13 %
Hematocrit: 36 % (ref 34.0–46.6)
Hemoglobin: 12 g/dL (ref 11.1–15.9)
Immature Grans (Abs): 0 10*3/uL (ref 0.0–0.1)
Immature Granulocytes: 0 %
Lymphocytes Absolute: 2.2 10*3/uL (ref 0.7–3.1)
Lymphs: 30 %
MCH: 29.3 pg (ref 26.6–33.0)
MCHC: 33.3 g/dL (ref 31.5–35.7)
MCV: 88 fL (ref 79–97)
Monocytes Absolute: 0.5 10*3/uL (ref 0.1–0.9)
Monocytes: 7 %
Neutrophils Absolute: 3.6 10*3/uL (ref 1.4–7.0)
Neutrophils: 49 %
Platelets: 333 10*3/uL (ref 150–379)
RBC: 4.1 x10E6/uL (ref 3.77–5.28)
RDW: 13.6 % (ref 12.3–15.4)
WBC: 7.3 10*3/uL (ref 3.4–10.8)

## 2015-12-30 LAB — SPECIMEN STATUS REPORT

## 2015-12-30 LAB — HEPATITIS C ANTIBODY: Hep C Virus Ab: <0.1 s/co ratio (ref 0.0–0.9)

## 2016-01-06 ENCOUNTER — Telehealth: Payer: Self-pay | Admitting: Family Medicine

## 2016-01-06 ENCOUNTER — Other Ambulatory Visit: Payer: Self-pay | Admitting: Family Medicine

## 2016-01-07 NOTE — Telephone Encounter (Signed)
Pt states she has already been on Zantac before. She will wait to speak with Dr. Ermalinda MemosBradshaw tomorrow.

## 2016-01-07 NOTE — Telephone Encounter (Signed)
Not on med list? Route to pool

## 2016-01-07 NOTE — Telephone Encounter (Signed)
She can take Zantac 150 mg twice a day if she hasn't already tried that. Otherwise if she does not want that then she can wait to talk to Dr. Ermalinda MemosBradshaw on when he returns tomorrow

## 2016-01-07 NOTE — Telephone Encounter (Signed)
Pt aware rx was denied.

## 2016-01-08 MED ORDER — PANTOPRAZOLE SODIUM 40 MG PO TBEC: 40.0000 mg | DELAYED_RELEASE_TABLET | Freq: Every day | ORAL | Status: DC

## 2016-01-08 NOTE — Telephone Encounter (Signed)
Pt aware.

## 2016-01-08 NOTE — Telephone Encounter (Signed)
Rx for protonix sent  Murtis SinkSam Bradshaw, MD [facility] 01/08/2016, 7:39 AM

## 2016-01-28 ENCOUNTER — Other Ambulatory Visit: Payer: Self-pay | Admitting: Family Medicine

## 2016-02-04 ENCOUNTER — Telehealth: Payer: Self-pay | Admitting: Family Medicine

## 2016-02-16 ENCOUNTER — Other Ambulatory Visit: Payer: Self-pay | Admitting: Family Medicine

## 2016-02-26 ENCOUNTER — Other Ambulatory Visit: Payer: Self-pay | Admitting: Family Medicine

## 2016-02-26 ENCOUNTER — Ambulatory Visit (INDEPENDENT_AMBULATORY_CARE_PROVIDER_SITE_OTHER): Payer: BLUE CROSS/BLUE SHIELD | Admitting: Family Medicine

## 2016-02-26 ENCOUNTER — Encounter: Payer: Self-pay | Admitting: Family Medicine

## 2016-02-26 VITALS — BP 138/87 | HR 91 | Temp 97.4°F | Ht 61.0 in | Wt 149.0 lb

## 2016-02-26 DIAGNOSIS — E1142 Type 2 diabetes mellitus with diabetic polyneuropathy: Secondary | ICD-10-CM

## 2016-02-26 DIAGNOSIS — F1021 Alcohol dependence, in remission: Secondary | ICD-10-CM | POA: Diagnosis not present

## 2016-02-26 DIAGNOSIS — M5431 Sciatica, right side: Secondary | ICD-10-CM | POA: Diagnosis not present

## 2016-02-26 DIAGNOSIS — Z0000 Encounter for general adult medical examination without abnormal findings: Secondary | ICD-10-CM

## 2016-02-26 DIAGNOSIS — F3341 Major depressive disorder, recurrent, in partial remission: Secondary | ICD-10-CM

## 2016-02-26 DIAGNOSIS — F1410 Cocaine abuse, uncomplicated: Secondary | ICD-10-CM

## 2016-02-26 DIAGNOSIS — F329 Major depressive disorder, single episode, unspecified: Secondary | ICD-10-CM | POA: Insufficient documentation

## 2016-02-26 DIAGNOSIS — F1411 Cocaine abuse, in remission: Secondary | ICD-10-CM | POA: Insufficient documentation

## 2016-02-26 DIAGNOSIS — M545 Low back pain: Secondary | ICD-10-CM

## 2016-02-26 MED ORDER — FLUTICASONE-SALMETEROL 115-21 MCG/ACT IN AERO: 2.0000 | INHALATION_SPRAY | Freq: Two times a day (BID) | RESPIRATORY_TRACT | 11 refills | Status: DC

## 2016-02-26 MED ORDER — LISINOPRIL 10 MG PO TABS: 20.0000 mg | ORAL_TABLET | Freq: Every day | ORAL | 3 refills | Status: DC

## 2016-02-26 MED ORDER — PREGABALIN 75 MG PO CAPS: 75.0000 mg | ORAL_CAPSULE | Freq: Two times a day (BID) | ORAL | 2 refills | Status: DC

## 2016-02-26 NOTE — Patient Instructions (Addendum)
Great to see you!  I have refilled the advair, lisinopril, and lyrica  Come back in 2 months to discuss diabetes.    Consider a Colonoscopy  Try Lyrica  Come back for follow up of diabetes in 2 months

## 2016-02-26 NOTE — Progress Notes (Signed)
   HPI  Patient presents today here for follow-up of chronic medical problems.  Diabetes Good medication compliance, she has been having some issues getting her insulin prescriptions, the pharmacy has been called today. No hypoglycemia Good medication compliance.  Depression [facility] Good medication compliance No suicidal thoughts.  Back pain Right-sided low back pain with right-sided radiation on the leg. Would like to try Lyrica again No bowel or bladder dysfunction or leg weakness, no saddle anesthesia  Patient has struggled recently with sensation to return to alcohol and cocaine. She's had one relapse of using alcohol, she attended to get cocaine but could not.  PMH: Smoking status noted ROS: Per HPI  Objective: BP 138/87   Pulse 91   Temp 97.4 F (36.3 C) (Oral)   Ht 5\' 1"  (1.549 m)   Wt 149 lb (67.6 kg)   BMI 28.15 kg/m  Gen: NAD, alert, cooperative with exam HEENT: NCAT, EOMI, PERRL CV: RRR Resp: CTABL, no wheezes, non-labored Ext: No edema, warm Neuro: Alert and oriented, No gross deficits  Diabetic Foot Exam - Simple   Simple Foot Form Visual Inspection No deformities, no ulcerations, no other skin breakdown bilaterally:  Yes Sensation Testing Intact to touch and monofilament testing bilaterally:  Yes Pulse Check Posterior Tibialis and Dorsalis pulse intact bilaterally:  Yes Comments    Psych: Odd affect, denies SI  Assessment and plan:  # Type 2 diabetes Good medication compliance, A1c not due for one more month Borderline controlled last check Foot exam normal today  # Measured depressive disorder Managed by psychiatry Appears stable Continue current medications  # Cocaine abuse in remission, alcoholism in remission No recent slip ups with cocaine, one recent relapse on alcohol Encouraged to continue cessation  # Right-sided low back pain with sciatica Appears to be chronic pain Trial of  Lyrica  HCM Mammo Discussed C scope, she will consider   Meds ordered this encounter  Medications  . RaNITidine HCl (ZANTAC PO)    Sig: Take 150 mg by mouth.  . fluticasone-salmeterol (ADVAIR HFA) 115-21 MCG/ACT inhaler    Sig: Inhale 2 puffs into the lungs 2 (two) times daily.    Dispense:  1 Inhaler    Refill:  11  . lisinopril (PRINIVIL,ZESTRIL) 10 MG tablet    Sig: Take 2 tablets (20 mg total) by mouth daily.    Dispense:  90 tablet    Refill:  3  . pregabalin (LYRICA) 75 MG capsule    Sig: Take 1 capsule (75 mg total) by mouth 2 (two) times daily.    Dispense:  60 capsule    Refill:  2    Murtis SinkSam Maloni Musleh, MD [facility] 02/26/2016, 1:32 PM

## 2016-02-27 ENCOUNTER — Telehealth: Payer: Self-pay | Admitting: Family Medicine

## 2016-03-03 ENCOUNTER — Telehealth: Payer: Self-pay | Admitting: Family Medicine

## 2016-03-03 NOTE — Telephone Encounter (Signed)
Patient aware that you are out of the office today and she will get a call back tomorrow. Patient verbalizes understanding. Please advise

## 2016-03-04 NOTE — Telephone Encounter (Signed)
Pt aware of recommendations

## 2016-03-04 NOTE — Telephone Encounter (Addendum)
Given her co-morbidities there are no simple escalations in pain treatment.  Lyrica is a good long-term medication, please continue medication.  For now I would recommend heat, massage, stretching, and adding Tylenol 1000 mg 3 times a day to the naproxen.  If She is not improving in a timely fashion please follow-up.  Murtis SinkSam Brandn Mcgath, MD [facility] 03/04/2016, 7:54 AM

## 2016-03-08 ENCOUNTER — Other Ambulatory Visit: Payer: Self-pay | Admitting: Family Medicine

## 2016-03-09 ENCOUNTER — Telehealth: Payer: Self-pay | Admitting: Family Medicine

## 2016-03-09 NOTE — Telephone Encounter (Signed)
Dr.Bradshaw has a request for this medication pending in his box waiting for his approval. Will close this encounter.

## 2016-04-01 ENCOUNTER — Other Ambulatory Visit: Payer: Self-pay | Admitting: Family Medicine

## 2016-04-06 ENCOUNTER — Other Ambulatory Visit: Payer: Self-pay | Admitting: Family Medicine

## 2016-04-13 ENCOUNTER — Telehealth: Payer: Self-pay | Admitting: Family Medicine

## 2016-04-13 NOTE — Telephone Encounter (Signed)
Patient aware and verbalizes understanding. 

## 2016-04-13 NOTE — Telephone Encounter (Signed)
She is already on maximum dose naproxen, do not take any additional NSAIDs with it. Adding 2 tylenol 3 times daily with the naproxen would be ok.  Needs follow up for additional Rx's.   Murtis SinkSam Khloey Chern, MD [facility] 04/13/2016, 12:27 PM

## 2016-04-30 ENCOUNTER — Ambulatory Visit: Payer: BLUE CROSS/BLUE SHIELD | Admitting: Family Medicine

## 2016-05-06 ENCOUNTER — Ambulatory Visit (INDEPENDENT_AMBULATORY_CARE_PROVIDER_SITE_OTHER): Payer: BLUE CROSS/BLUE SHIELD | Admitting: Family Medicine

## 2016-05-06 ENCOUNTER — Encounter: Payer: Self-pay | Admitting: Family Medicine

## 2016-05-06 VITALS — BP 153/95 | HR 86 | Temp 96.7°F | Ht 61.0 in | Wt 153.0 lb

## 2016-05-06 DIAGNOSIS — M5441 Lumbago with sciatica, right side: Secondary | ICD-10-CM

## 2016-05-06 DIAGNOSIS — I10 Essential (primary) hypertension: Secondary | ICD-10-CM | POA: Diagnosis not present

## 2016-05-06 DIAGNOSIS — E1142 Type 2 diabetes mellitus with diabetic polyneuropathy: Secondary | ICD-10-CM

## 2016-05-06 LAB — BAYER DCA HB A1C WAIVED: HB A1C (BAYER DCA - WAIVED): 9.8 % — ABNORMAL HIGH (ref <7.0)

## 2016-05-06 MED ORDER — PREGABALIN 150 MG PO CAPS: 150.0000 mg | ORAL_CAPSULE | Freq: Two times a day (BID) | ORAL | 5 refills | Status: DC

## 2016-05-06 MED ORDER — LIRAGLUTIDE 18 MG/3ML ~~LOC~~ SOPN: 1.2000 mg | PEN_INJECTOR | Freq: Every day | SUBCUTANEOUS | 3 refills | Status: DC

## 2016-05-06 MED ORDER — LISINOPRIL 20 MG PO TABS: 20.0000 mg | ORAL_TABLET | Freq: Every day | ORAL | 3 refills | Status: DC

## 2016-05-06 NOTE — Patient Instructions (Signed)
Great to see you!  Start victoza, start with 0.641mL daily for 1 week, then increase to the regular dose of 0.2 mL daily  I have increased the lyrica

## 2016-05-06 NOTE — Progress Notes (Signed)
   HPI  Patient presents today follow-up for diabetes, hypertension, and back pain.  Back pain New complaint Right-sided low back pain with intermittent radiation down the right lateral leg onto the foot No leg weakness or numbness. She does have such severe pains at times is difficult to walk. She has not talked to her surgeon about this.  Hypertension Does not check blood pressure at home Good medication compliance, currently taking 20 mg of lisinopril daily.  Diabetes She is fully aware that she has not been watching her diet closely Good medication compliance No hypoglycemia Average fasting blood sugar 200 - 250   PMH: Smoking status noted ROS: Per HPI  Objective: BP (!) 153/95   Pulse 86   Temp (!) 96.7 F (35.9 C) (Oral)   Ht 5\' 1"  (1.549 m)   Wt 153 lb (69.4 kg)   BMI 28.91 kg/m  Gen: NAD, alert, cooperative with exam HEENT: NCAT CV: RRR, good S1/S2, no murmur Resp: CTABL, no wheezes, non-labored Ext: No edema, warm Neuro: Alert and oriented, strength 5/5 and sensation intact in bilateral lower extremities, 2+ patellar tendon reflexes symmetric bilaterally Negative straight leg raise  Musculoskeletal Mild tenderness to palpation of midline lumbar spine and right sided paraspinal muscles in the lumbar area  Diabetic Foot Exam - Simple   Simple Foot Form Visual Inspection No deformities, no ulcerations, no other skin breakdown bilaterally:  Yes Sensation Testing Intact to touch and monofilament testing bilaterally:  Yes Pulse Check Posterior Tibialis and Dorsalis pulse intact bilaterally:  Yes Comments     Assessment and plan:  # Type 2 diabetes Uncontrolled, control has slipped quite a bit A1c change from 6.9 to 9.8. Discussed diet, she will attempt improvement. Continue 15 units of Lantus, adding Victoza  # Hypertension Elevated today Continue lisinopril 20 mg, Synthroid 20 mg pill Likely elevated due to pain today  # Low back pain with  sciatica Severe intermittent pain Increase Lyrica Given her recent history of severe neck pathology I have recommended that she discuss this with her surgeon, however her exam is reassuring with no leg weakness and preserved sensation.  Follow-up one month  Orders Placed This Encounter  Procedures  . Bayer DCA Hb A1c Waived    Meds ordered this encounter  Medications  . liraglutide 18 MG/3ML SOPN    Sig: Inject 0.2 mLs (1.2 mg total) into the skin daily. Start with 0.1 mLs (0.6 mg) daily for 1 week, then increase to 0.2 mLs daily    Dispense:  2 pen    Refill:  3  . lisinopril (PRINIVIL,ZESTRIL) 20 MG tablet    Sig: Take 1 tablet (20 mg total) by mouth daily.    Dispense:  30 tablet    Refill:  3  . pregabalin (LYRICA) 150 MG capsule    Sig: Take 1 capsule (150 mg total) by mouth 2 (two) times daily.    Dispense:  60 capsule    Refill:  5    Murtis SinkSam Bradshaw, MD [facility] 05/06/2016, 5:23 PM

## 2016-05-17 ENCOUNTER — Telehealth: Payer: Self-pay | Admitting: Family Medicine

## 2016-05-17 NOTE — Telephone Encounter (Signed)
Pt notified of recommendation Verbalizes understanding 

## 2016-05-17 NOTE — Telephone Encounter (Signed)
If she can tolerate it there is a good chance it will resolve.   Would recommend continueing 0.6 mg ( 1 click) Dose for another week to get used to it. If it doesn't get better by the end of the week will probably need to change to an alternative med.   Murtis SinkSam Marielena Harvell, MD [facility] 05/17/2016, 12:15 PM

## 2016-05-17 NOTE — Telephone Encounter (Signed)
Dr Ermalinda MemosBradshaw started pt on Victoza x 1 wk Pt today is having nausea with constipation Do you want her to continue on Victoza Please review and advise

## 2016-06-02 ENCOUNTER — Other Ambulatory Visit: Payer: Self-pay | Admitting: Family Medicine

## 2016-06-07 ENCOUNTER — Encounter: Payer: Self-pay | Admitting: Family Medicine

## 2016-06-23 ENCOUNTER — Encounter: Payer: Self-pay | Admitting: Family Medicine

## 2016-06-23 ENCOUNTER — Ambulatory Visit (INDEPENDENT_AMBULATORY_CARE_PROVIDER_SITE_OTHER): Payer: BLUE CROSS/BLUE SHIELD | Admitting: Family Medicine

## 2016-06-23 VITALS — BP 98/64 | HR 100 | Temp 97.5°F | Ht 61.0 in | Wt 153.2 lb

## 2016-06-23 DIAGNOSIS — R42 Dizziness and giddiness: Secondary | ICD-10-CM

## 2016-06-23 DIAGNOSIS — G8918 Other acute postprocedural pain: Secondary | ICD-10-CM | POA: Diagnosis not present

## 2016-06-23 MED ORDER — PREGABALIN 75 MG PO CAPS: 75.0000 mg | ORAL_CAPSULE | Freq: Two times a day (BID) | ORAL | 0 refills | Status: DC

## 2016-06-23 MED ORDER — PREGABALIN 75 MG PO CAPS: 150.0000 mg | ORAL_CAPSULE | Freq: Two times a day (BID) | ORAL | 0 refills | Status: DC

## 2016-06-23 NOTE — Progress Notes (Signed)
Subjective:  Patient ID: Lindsey Melton, female    DOB: 04/24/1951  Age: 65 y.o. MRN: [id number]  CC: Medication Problem ([facility] and sent over here today because they were concerned she was having a reaction to the Lyrica, rash on arm, grinding teeth, dizziness and staggering)   HPI Lindsey Melton presents for Patient states that she's always ground her teeth since childhood. There is nothing new about that. The redness on her arm [facility] but was not bothering her. Her main concern is that she feels off balance and dizzy. That's been going on ever since her surgery in March.. However, it seems to worsened over the last few days. Patient also states that even this higher dose is not relieving her pain. She had neck surgery (cervical fusion) back in March of this year. Her feeling of off balance seems to have originated then. She also continues to have pain, unrelieved by Lyrica, 7/10 at the [facility]   History Lindsey Melton has a past medical history of Anxiety; Arthritis; Asthma; Cataract; Cervical myelopathy (HCC); COPD (chronic obstructive pulmonary disease) (HCC); Depression; Diabetes mellitus; Headache; History of hiatal hernia; Hyperlipidemia; Hypertension; Kidney stones; Neuromuscular disorder (HCC); Peripheral vertigo; Substance abuse; and Wears glasses.   She has a past surgical history that includes Abdominal hysterectomy; Appendectomy; Anterior cervical decomp/discectomy fusion (N/A, 09/10/2015); and Posterior cervical fusion/foraminotomy (N/A, 09/10/2015).   Her family history includes Alcohol abuse in her son; Anxiety disorder in her daughter and son; Arthritis in her mother; Asthma in her mother; Depression in her daughter; Diabetes in her mother; Drug abuse in her son; Hearing loss in her mother; Heart disease in her father and mother; Hypertension in her mother; Mental illness in her daughter; Stroke in her mother; Vision loss in her daughter and mother.She  reports that she has quit smoking. Her smoking use included Cigarettes. She has never used smokeless tobacco. She reports that she drinks alcohol. She reports that she uses drugs, including "Crack" cocaine.    ROS Review of Systems  Constitutional: Negative for activity change, appetite change and fever.  HENT: Negative for congestion, rhinorrhea and sore throat.   Eyes: Negative for visual disturbance.  Respiratory: Negative for cough and shortness of breath.   Cardiovascular: Negative for chest pain and palpitations.  Gastrointestinal: Negative for abdominal pain, diarrhea and nausea.  Genitourinary: Negative for dysuria.  Musculoskeletal: Negative for arthralgias and myalgias.  Neurological: Positive for light-headedness. Negative for tremors, syncope and speech difficulty.  Psychiatric/Behavioral: Negative for agitation, confusion and dysphoric mood. The patient is not nervous/anxious.     Objective:  BP 98/64   Pulse 100   Temp 97.5 F (36.4 C) (Oral)   Ht 5\' 1"  (1.549 m)   Wt 153 lb 4 oz (69.5 kg)   BMI 28.96 kg/m   BP Readings from Last 3 Encounters:  06/23/16 98/64  05/06/16 (!) 153/95  02/26/16 138/87    Wt Readings from Last 3 Encounters:  06/23/16 153 lb 4 oz (69.5 kg)  05/06/16 153 lb (69.4 kg)  02/26/16 149 lb (67.6 kg)     Physical Exam  Constitutional: She is oriented to person, place, and time. She appears well-developed. No distress.  HENT:  Head: Normocephalic and atraumatic.  Mouth/Throat: Oropharynx is clear and moist.  Eyes: EOM are normal. Pupils are equal, round, and reactive to light.  Neck: Normal range of motion. Neck supple.  Cardiovascular: Normal rate, regular rhythm and normal heart sounds.  Pulmonary/Chest: Effort normal and breath sounds normal.  Abdominal: Soft. There is no tenderness.  Neurological: She is alert and oriented to person, place, and time. She has normal reflexes. She displays normal reflexes. No cranial nerve deficit.  She exhibits normal muscle tone. Coordination normal.  Skin: Skin is warm and dry. There is erythema (mild urticarial rash at right ventral forearm).     Lab Results  Component Value Date   WBC 7.3 12/26/2015   HGB 10.7 (L) 09/12/2015   HCT 36.0 12/26/2015   PLT 333 12/26/2015   GLUCOSE 184 (H) 12/26/2015   TRIG 189 (H) 09/10/2015   ALT 11 12/26/2015   AST 11 12/26/2015   NA 143 12/26/2015   K 4.5 12/26/2015   CL 104 12/26/2015   CREATININE 1.00 12/26/2015   BUN 22 12/26/2015   CO2 19 12/26/2015   TSH 1.930 07/25/2015   HGBA1C 6.4 07/25/2015    No results found.  Assessment & Plan:   Lindsey Melton was seen today for medication problem.  Diagnoses and all orders for this visit:  Dizziness  Post-operative pain  Other orders -     Discontinue: pregabalin (LYRICA) 75 MG capsule; Take 2 capsules (150 mg total) by mouth 2 (two) times daily. For 5 days, then one daily. -     pregabalin (LYRICA) 75 MG capsule; Take 1 capsule (75 mg total) by mouth 2 (two) times daily. For 5 days, then one daily.    She will taper off of the Lyrica as above. Follow up soon with Dr. Ermalinda Memos to consider treatment options for pain relief. Hydroxyzine should help with her rash. Patient states multiple times that she needs some kind of medicine for pain relief. Since she has been working on this problem with Dr. Ermalinda Memos and she has a history of polysubstance abuse I explained her that she would need to have management of her pain through her primary doctor. She also could opt for a pain management referral if desired.  I have discontinued Lindsey Melton's naproxen and pregabalin. I have also changed her pregabalin. Additionally, I am having her maintain her insulin glargine, venlafaxine, topiramate, atorvastatin, hydrOXYzine, RaNITidine HCl (ZANTAC PO), fluticasone-salmeterol, liraglutide, lisinopril, and VENTOLIN HFA.  Meds ordered this encounter  Medications  . DISCONTD: pregabalin (LYRICA) 75 MG capsule      Sig: Take 2 capsules (150 mg total) by mouth 2 (two) times daily. For 5 days, then one daily.    Dispense:  30 capsule    Refill:  0  . pregabalin (LYRICA) 75 MG capsule    Sig: Take 1 capsule (75 mg total) by mouth 2 (two) times daily. For 5 days, then one daily.    Dispense:  30 capsule    Refill:  0     Medication List       Accurate as of 06/23/16  3:17 PM. Always use your most recent med list.          atorvastatin 10 MG tablet Commonly known as:  LIPITOR Take 1 tablet (10 mg total) by mouth daily.   fluticasone-salmeterol 115-21 MCG/ACT inhaler Commonly known as:  ADVAIR HFA Inhale 2 puffs into the lungs 2 (two) times daily.   hydrOXYzine 25 MG tablet Commonly known as:  ATARAX/VISTARIL Take 25 mg by mouth. One tablet every morning, two at bedtime.   insulin glargine 100 UNIT/ML injection Commonly known as:  LANTUS Inject 15 Units into the skin at bedtime.   liraglutide 18 MG/3ML Sopn Inject 0.2 mLs (1.2  mg total) into the skin daily. Start with 0.1 mLs (0.6 mg) daily for 1 week, then increase to 0.2 mLs daily   lisinopril 20 MG tablet Commonly known as:  PRINIVIL,ZESTRIL Take 1 tablet (20 mg total) by mouth daily.   pregabalin 75 MG capsule Commonly known as:  LYRICA Take 1 capsule (75 mg total) by mouth 2 (two) times daily. For 5 days, then one daily.   topiramate 100 MG tablet Commonly known as:  TOPAMAX Take 1 tablet (100 mg total) by mouth 2 (two) times daily.   venlafaxine 75 MG tablet Commonly known as:  EFFEXOR Take 75 mg by mouth daily.   VENTOLIN HFA 108 (90 Base) MCG/ACT inhaler Generic drug:  albuterol INHALE 2 PUFFS INTO THE LUNGS EVERY 4 HOURS AS NEEDED.   ZANTAC PO Take 150 mg by mouth.        Follow-up: Return in about 1 week (around 06/30/2016) for Pain with Dr. Ermalinda MemosBradshaw.Mechele Claude.  Samia Kukla, M.D.

## 2016-06-25 ENCOUNTER — Ambulatory Visit: Payer: BLUE CROSS/BLUE SHIELD | Admitting: Family Medicine

## 2016-06-29 ENCOUNTER — Ambulatory Visit: Payer: BLUE CROSS/BLUE SHIELD | Admitting: Family Medicine

## 2016-06-29 ENCOUNTER — Encounter: Payer: Self-pay | Admitting: Family Medicine

## 2016-07-02 ENCOUNTER — Telehealth: Payer: Self-pay | Admitting: Family Medicine

## 2016-07-02 NOTE — Telephone Encounter (Signed)
Patient complains of burning in her right arm. She is a very poor historian and it is very hard to elicit information from her. She says that the burning has been on going on for a while and she believes it is a side effect of the Lyrica. Dosage was decreased about a week ago due to [facility] about right arm redness, teeth grinding, & dizziness. At that time she had some redness on her right arm but note states that it wasn't bothering her. Her dosage was decreased but she says the burning hasn't improved. Rash has improved.  Any thoughts or suggestions?

## 2016-07-02 NOTE — Telephone Encounter (Signed)
Ok to stop medication.   Would recommend decreasing to once daily for 1 week then stopping.   Murtis SinkSam Marlyce Mcdougald, MD [facility] 07/02/2016, 12:21 PM

## 2016-07-02 NOTE — Telephone Encounter (Signed)
Pt notified of recommendation Verbalizes understanding 

## 2016-07-13 ENCOUNTER — Other Ambulatory Visit: Payer: Self-pay | Admitting: Family Medicine

## 2016-07-21 DIAGNOSIS — F330 Major depressive disorder, recurrent, mild: Secondary | ICD-10-CM | POA: Diagnosis not present

## 2016-07-22 DIAGNOSIS — J45909 Unspecified asthma, uncomplicated: Secondary | ICD-10-CM | POA: Diagnosis not present

## 2016-07-22 DIAGNOSIS — Z79899 Other long term (current) drug therapy: Secondary | ICD-10-CM | POA: Diagnosis not present

## 2016-07-22 DIAGNOSIS — E1165 Type 2 diabetes mellitus with hyperglycemia: Secondary | ICD-10-CM | POA: Diagnosis not present

## 2016-07-22 DIAGNOSIS — E118 Type 2 diabetes mellitus with unspecified complications: Secondary | ICD-10-CM | POA: Diagnosis not present

## 2016-07-22 DIAGNOSIS — Z299 Encounter for prophylactic measures, unspecified: Secondary | ICD-10-CM | POA: Diagnosis not present

## 2016-07-22 DIAGNOSIS — R5383 Other fatigue: Secondary | ICD-10-CM | POA: Diagnosis not present

## 2016-07-22 DIAGNOSIS — I10 Essential (primary) hypertension: Secondary | ICD-10-CM | POA: Diagnosis not present

## 2016-08-13 ENCOUNTER — Other Ambulatory Visit: Payer: Self-pay | Admitting: Family Medicine

## 2016-09-13 DIAGNOSIS — Z87891 Personal history of nicotine dependence: Secondary | ICD-10-CM | POA: Diagnosis not present

## 2016-09-13 DIAGNOSIS — Z713 Dietary counseling and surveillance: Secondary | ICD-10-CM | POA: Diagnosis not present

## 2016-09-13 DIAGNOSIS — E118 Type 2 diabetes mellitus with unspecified complications: Secondary | ICD-10-CM | POA: Diagnosis not present

## 2016-09-13 DIAGNOSIS — Z299 Encounter for prophylactic measures, unspecified: Secondary | ICD-10-CM | POA: Diagnosis not present

## 2016-09-13 DIAGNOSIS — E7800 Pure hypercholesterolemia, unspecified: Secondary | ICD-10-CM | POA: Diagnosis not present

## 2016-09-13 DIAGNOSIS — I10 Essential (primary) hypertension: Secondary | ICD-10-CM | POA: Diagnosis not present

## 2016-09-13 DIAGNOSIS — F3130 Bipolar disorder, current episode depressed, mild or moderate severity, unspecified: Secondary | ICD-10-CM | POA: Diagnosis not present

## 2016-09-13 DIAGNOSIS — Z6829 Body mass index (BMI) 29.0-29.9, adult: Secondary | ICD-10-CM | POA: Diagnosis not present

## 2016-09-13 DIAGNOSIS — E1165 Type 2 diabetes mellitus with hyperglycemia: Secondary | ICD-10-CM | POA: Diagnosis not present

## 2016-09-15 ENCOUNTER — Other Ambulatory Visit: Payer: Self-pay | Admitting: Family Medicine

## 2016-09-21 ENCOUNTER — Other Ambulatory Visit: Payer: Self-pay | Admitting: Family Medicine

## 2016-10-01 DIAGNOSIS — F419 Anxiety disorder, unspecified: Secondary | ICD-10-CM | POA: Diagnosis not present

## 2016-10-12 DIAGNOSIS — J45909 Unspecified asthma, uncomplicated: Secondary | ICD-10-CM | POA: Insufficient documentation

## 2016-10-12 DIAGNOSIS — E119 Type 2 diabetes mellitus without complications: Secondary | ICD-10-CM | POA: Diagnosis not present

## 2016-10-12 DIAGNOSIS — Z794 Long term (current) use of insulin: Secondary | ICD-10-CM | POA: Diagnosis not present

## 2016-10-12 DIAGNOSIS — Z0289 Encounter for other administrative examinations: Secondary | ICD-10-CM | POA: Diagnosis not present

## 2016-10-12 DIAGNOSIS — Z87898 Personal history of other specified conditions: Secondary | ICD-10-CM | POA: Diagnosis not present

## 2016-10-12 DIAGNOSIS — I10 Essential (primary) hypertension: Secondary | ICD-10-CM | POA: Diagnosis not present

## 2016-10-12 DIAGNOSIS — E1159 Type 2 diabetes mellitus with other circulatory complications: Secondary | ICD-10-CM | POA: Insufficient documentation

## 2016-10-12 DIAGNOSIS — I152 Hypertension secondary to endocrine disorders: Secondary | ICD-10-CM | POA: Insufficient documentation

## 2016-10-12 DIAGNOSIS — E785 Hyperlipidemia, unspecified: Secondary | ICD-10-CM | POA: Diagnosis not present

## 2016-10-12 DIAGNOSIS — F1911 Other psychoactive substance abuse, in remission: Secondary | ICD-10-CM | POA: Insufficient documentation

## 2016-12-09 DIAGNOSIS — E119 Type 2 diabetes mellitus without complications: Secondary | ICD-10-CM | POA: Diagnosis not present

## 2016-12-09 DIAGNOSIS — Z794 Long term (current) use of insulin: Secondary | ICD-10-CM | POA: Diagnosis not present

## 2016-12-09 DIAGNOSIS — I10 Essential (primary) hypertension: Secondary | ICD-10-CM | POA: Diagnosis not present

## 2017-01-11 DIAGNOSIS — I10 Essential (primary) hypertension: Secondary | ICD-10-CM | POA: Diagnosis not present

## 2017-01-11 DIAGNOSIS — Z794 Long term (current) use of insulin: Secondary | ICD-10-CM | POA: Diagnosis not present

## 2017-01-11 DIAGNOSIS — E119 Type 2 diabetes mellitus without complications: Secondary | ICD-10-CM | POA: Diagnosis not present

## 2017-01-11 DIAGNOSIS — E785 Hyperlipidemia, unspecified: Secondary | ICD-10-CM | POA: Diagnosis not present

## 2017-01-14 DIAGNOSIS — I10 Essential (primary) hypertension: Secondary | ICD-10-CM | POA: Diagnosis not present

## 2017-01-14 DIAGNOSIS — E119 Type 2 diabetes mellitus without complications: Secondary | ICD-10-CM | POA: Diagnosis not present

## 2017-04-27 DIAGNOSIS — Z0000 Encounter for general adult medical examination without abnormal findings: Secondary | ICD-10-CM | POA: Diagnosis not present

## 2017-04-27 DIAGNOSIS — E119 Type 2 diabetes mellitus without complications: Secondary | ICD-10-CM | POA: Diagnosis not present

## 2017-04-27 DIAGNOSIS — I10 Essential (primary) hypertension: Secondary | ICD-10-CM | POA: Diagnosis not present

## 2017-04-27 DIAGNOSIS — E785 Hyperlipidemia, unspecified: Secondary | ICD-10-CM | POA: Diagnosis not present

## 2017-04-27 DIAGNOSIS — Z794 Long term (current) use of insulin: Secondary | ICD-10-CM | POA: Diagnosis not present

## 2017-05-02 DIAGNOSIS — I10 Essential (primary) hypertension: Secondary | ICD-10-CM | POA: Diagnosis not present

## 2017-05-02 DIAGNOSIS — E119 Type 2 diabetes mellitus without complications: Secondary | ICD-10-CM | POA: Diagnosis not present

## 2017-05-02 DIAGNOSIS — E785 Hyperlipidemia, unspecified: Secondary | ICD-10-CM | POA: Diagnosis not present

## 2017-05-02 DIAGNOSIS — Z794 Long term (current) use of insulin: Secondary | ICD-10-CM | POA: Diagnosis not present

## 2017-08-08 DIAGNOSIS — Z1159 Encounter for screening for other viral diseases: Secondary | ICD-10-CM | POA: Diagnosis not present

## 2017-08-08 DIAGNOSIS — I10 Essential (primary) hypertension: Secondary | ICD-10-CM | POA: Diagnosis not present

## 2017-08-08 DIAGNOSIS — E1165 Type 2 diabetes mellitus with hyperglycemia: Secondary | ICD-10-CM | POA: Diagnosis not present

## 2017-08-08 DIAGNOSIS — E1159 Type 2 diabetes mellitus with other circulatory complications: Secondary | ICD-10-CM | POA: Diagnosis not present

## 2017-08-12 DIAGNOSIS — E119 Type 2 diabetes mellitus without complications: Secondary | ICD-10-CM | POA: Diagnosis not present

## 2017-08-12 DIAGNOSIS — Z1159 Encounter for screening for other viral diseases: Secondary | ICD-10-CM | POA: Diagnosis not present

## 2017-08-12 DIAGNOSIS — I10 Essential (primary) hypertension: Secondary | ICD-10-CM | POA: Diagnosis not present

## 2017-09-05 DIAGNOSIS — I10 Essential (primary) hypertension: Secondary | ICD-10-CM | POA: Diagnosis not present

## 2017-09-05 DIAGNOSIS — F609 Personality disorder, unspecified: Secondary | ICD-10-CM | POA: Insufficient documentation

## 2017-09-05 DIAGNOSIS — E1159 Type 2 diabetes mellitus with other circulatory complications: Secondary | ICD-10-CM | POA: Diagnosis not present

## 2017-09-05 DIAGNOSIS — E1165 Type 2 diabetes mellitus with hyperglycemia: Secondary | ICD-10-CM | POA: Diagnosis not present

## 2017-09-05 DIAGNOSIS — Z23 Encounter for immunization: Secondary | ICD-10-CM | POA: Diagnosis not present

## 2017-10-05 DIAGNOSIS — E785 Hyperlipidemia, unspecified: Secondary | ICD-10-CM | POA: Diagnosis not present

## 2017-10-05 DIAGNOSIS — E1159 Type 2 diabetes mellitus with other circulatory complications: Secondary | ICD-10-CM | POA: Diagnosis not present

## 2017-10-05 DIAGNOSIS — E1169 Type 2 diabetes mellitus with other specified complication: Secondary | ICD-10-CM | POA: Diagnosis not present

## 2017-10-05 DIAGNOSIS — I10 Essential (primary) hypertension: Secondary | ICD-10-CM | POA: Diagnosis not present

## 2017-10-21 ENCOUNTER — Other Ambulatory Visit: Payer: Self-pay

## 2017-10-21 ENCOUNTER — Encounter (HOSPITAL_COMMUNITY): Payer: Self-pay | Admitting: *Deleted

## 2017-10-21 ENCOUNTER — Emergency Department (HOSPITAL_COMMUNITY)
Admission: EM | Admit: 2017-10-21 | Discharge: 2017-10-21 | Disposition: A | Discharge status: Home / Self Care | Payer: Medicare Other | Attending: Emergency Medicine | Admitting: Emergency Medicine

## 2017-10-21 DIAGNOSIS — I10 Essential (primary) hypertension: Secondary | ICD-10-CM | POA: Insufficient documentation

## 2017-10-21 DIAGNOSIS — M545 Low back pain: Secondary | ICD-10-CM | POA: Diagnosis not present

## 2017-10-21 DIAGNOSIS — Z794 Long term (current) use of insulin: Secondary | ICD-10-CM | POA: Insufficient documentation

## 2017-10-21 DIAGNOSIS — J449 Chronic obstructive pulmonary disease, unspecified: Secondary | ICD-10-CM | POA: Diagnosis not present

## 2017-10-21 DIAGNOSIS — E119 Type 2 diabetes mellitus without complications: Secondary | ICD-10-CM | POA: Insufficient documentation

## 2017-10-21 DIAGNOSIS — M62830 Muscle spasm of back: Secondary | ICD-10-CM | POA: Insufficient documentation

## 2017-10-21 DIAGNOSIS — Z79899 Other long term (current) drug therapy: Secondary | ICD-10-CM | POA: Diagnosis not present

## 2017-10-21 DIAGNOSIS — Z87891 Personal history of nicotine dependence: Secondary | ICD-10-CM | POA: Diagnosis not present

## 2017-10-21 MED ORDER — PREDNISONE 50 MG PO TABS
50.0000 mg | ORAL_TABLET | Freq: Once | ORAL | Status: DC
  Filled 2017-10-21: qty 1

## 2017-10-21 MED ORDER — CYCLOBENZAPRINE HCL 10 MG PO TABS
10.0000 mg | ORAL_TABLET | Freq: Once | ORAL | Status: AC
  Administered 2017-10-21: 10 mg via ORAL

## 2017-10-21 MED ORDER — CYCLOBENZAPRINE HCL 5 MG PO TABS: 5.0000 mg | ORAL_TABLET | Freq: Two times a day (BID) | ORAL | 0 refills | Status: DC | PRN

## 2017-10-21 MED ORDER — CYCLOBENZAPRINE HCL 10 MG PO TABS
5.0000 mg | ORAL_TABLET | Freq: Once | ORAL | Status: DC
  Filled 2017-10-21: qty 1

## 2017-10-21 NOTE — ED Provider Notes (Signed)
[facility] EMERGENCY DEPARTMENT Provider Note   CSN: [id number] Arrival date & time: 10/21/17  2052     History   Chief Complaint Chief Complaint  Patient presents with  . Back Pain    HPI Lindsey Melton is a 66 y.o. female with hx of multiple medical problems as listed in PMH who presents to the ED with back pain. Patient reports that pain has been going on "for a while". Patient states she has DDD and it is getting harder for her to stand even to wash dishes. Patient has had cervical fusion years ago. The pain now is located in the right lower lumbar area. Patient states it feels like her back is in spasm. She denies UTI symptoms or loss of control of bladder or bowels.  HPI  Past Medical History:  Diagnosis Date  . Anxiety   . Arthritis   . Asthma   . Cataract   . Cervical myelopathy (HCC)   . COPD (chronic obstructive pulmonary disease) (HCC)   . Depression   . Diabetes mellitus   . Headache   . History of hiatal hernia   . Hyperlipidemia   . Hypertension   . Kidney stones   . Neuromuscular disorder (HCC)   . Peripheral vertigo   . Substance abuse (HCC)   . Wears glasses     Patient Active Problem List   Diagnosis Date Noted  . Major depressive disorder 02/26/2016  . Cocaine abuse in remission (HCC) 02/26/2016  . Alcoholism in remission (HCC) 02/26/2016  . Healthcare maintenance 12/26/2015  . Cervical myelopathy with cervical radiculopathy 09/16/2015  . Benign essential HTN   . DM type 2 with diabetic peripheral neuropathy (HCC)   . Chronic obstructive pulmonary disease (HCC)   . Post-operative pain   . Acute blood loss anemia   . Dysphagia   . Myelopathy (HCC) 09/10/2015  . S/P cervical spinal fusion   . Surgery, elective   . Postprocedural hypotension   . Spondylosis, cervical, with myelopathy 08/24/2015  . Polysubstance abuse (HCC) 08/24/2015  . Cold extremities 08/08/2015  . Leg pain 08/08/2015  . Muscle weakness 07/29/2015  . Neck pain  07/25/2015  . Hyponatremia 07/25/2015    Past Surgical History:  Procedure Laterality Date  . ABDOMINAL HYSTERECTOMY    . ANTERIOR CERVICAL DECOMP/DISCECTOMY FUSION N/A 09/10/2015   Procedure: ANTERIOR C3-4 DECOMPRESSION AND FUSION;  Surgeon: Venita Lick, MD;  Location: [facility] OR;  Service: Orthopedics;  Laterality: N/A;  . APPENDECTOMY    . POSTERIOR CERVICAL FUSION/FORAMINOTOMY N/A 09/10/2015   Procedure: POSTERIOR CERVICAL FUSION/FORAMINOTOMY Cervical 3-4;  Surgeon: Venita Lick, MD;  Location: [facility] OR;  Service: Orthopedics;  Laterality: N/A;     OB History   None      Home Medications    Prior to Admission medications   Medication Sig Start Date End Date Taking? Authorizing Provider  atorvastatin (LIPITOR) 10 MG tablet TAKE ONE TABLET BY MOUTH ONCE DAILY. 08/13/16   Elenora Gamma, MD  cyclobenzaprine (FLEXERIL) 5 MG tablet Take 1 tablet (5 mg total) by mouth 2 (two) times daily as needed for muscle spasms. 10/21/17   Janne Napoleon, NP  fluticasone-salmeterol (ADVAIR HFA) [phone] MCG/ACT inhaler Inhale 2 puffs into the lungs 2 (two) times daily. 02/26/16   Elenora Gamma, MD  hydrOXYzine (ATARAX/VISTARIL) 25 MG tablet Take 25 mg by mouth. One tablet every morning, two at bedtime.    [provider]  insulin glargine (LANTUS) 100 UNIT/ML injection Inject 15  Units into the skin at bedtime.     [provider]  liraglutide 18 MG/3ML SOPN Inject 0.2 mLs (1.2 mg total) into the skin daily. Start with 0.1 mLs (0.6 mg) daily for 1 week, then increase to 0.2 mLs daily 05/06/16   Elenora Gamma, MD  lisinopril (PRINIVIL,ZESTRIL) 20 MG tablet Take 1 tablet (20 mg total) by mouth daily. 05/06/16   Elenora Gamma, MD  pregabalin (LYRICA) 75 MG capsule Take 1 capsule (75 mg total) by mouth 2 (two) times daily. For 5 days, then one daily. 06/23/16   Mechele Claude, MD  RaNITidine HCl (ZANTAC PO) Take 150 mg by mouth.    [provider]  topiramate (TOPAMAX) 100  MG tablet Take 1 tablet (100 mg total) by mouth 2 (two) times daily. 07/28/15   Elenora Gamma, MD  venlafaxine (EFFEXOR) 75 MG tablet Take 75 mg by mouth daily.     [provider]  VENTOLIN HFA 108 (90 Base) MCG/ACT inhaler INHALE 2 PUFFS INTO THE LUNGS EVERY 4 HOURS AS NEEDED. 07/13/16   Elenora Gamma, MD    Family History Family History  Problem Relation Age of Onset  . Arthritis Mother   . Diabetes Mother   . Hearing loss Mother   . Heart disease Mother   . Hypertension Mother   . Vision loss Mother   . Stroke Mother   . Asthma Mother   . Heart disease Father   . Depression Daughter   . Mental illness Daughter   . Vision loss Daughter   . Anxiety disorder Daughter   . Alcohol abuse Son   . Drug abuse Son   . Anxiety disorder Son     Social History Social History   Tobacco Use  . Smoking status: Former Smoker    Types: Cigarettes  . Smokeless tobacco: Never Used  Substance Use Topics  . Alcohol use: Not Currently    Comment: last drank " a couple of beers" in December 2016  . Drug use: Not Currently    Types: "Crack" cocaine    Comment: last used crack cocaine in December 2016     Allergies   Prozac [fluoxetine hcl]   Review of Systems Review of Systems  Constitutional: Negative for chills and fever.  HENT: Negative.   Respiratory: Negative for cough and shortness of breath.   Cardiovascular: Negative for chest pain.  Gastrointestinal: Negative for abdominal pain, nausea and vomiting.  Genitourinary: Negative for dysuria, frequency and urgency.  Musculoskeletal: Positive for arthralgias and back pain.  Skin: Negative for wound.  Neurological: Negative for headaches.     Physical Exam Updated Vital Signs BP (!) 150/96 (BP Location: Right Arm)   Pulse 95   Temp 97.8 F (36.6 C) (Oral)   Resp 15   Ht 5\' 1"  (1.549 m)   Wt 71.2 kg (157 lb)   SpO2 98%   BMI 29.66 kg/m   Physical Exam  Constitutional: She appears well-developed and  well-nourished. No distress.  HENT:  Head: Normocephalic and atraumatic.  Eyes: EOM are normal.  Neck: Normal range of motion. Neck supple.  Cardiovascular: Normal rate and intact distal pulses.  Pulmonary/Chest: Effort normal.  Abdominal: Soft. There is no tenderness.  Musculoskeletal: Normal range of motion.       Lumbar back: She exhibits tenderness and spasm. She exhibits no deformity and normal pulse. Decreased range of motion: due to pain.       Back:  Pain with ambulation  Neurological: She is alert. She has normal strength. No sensory deficit.  Reflex Scores:      Bicep reflexes are 2+ on the right side and 2+ on the left side.      Brachioradialis reflexes are 2+ on the right side and 2+ on the left side.      Patellar reflexes are 2+ on the right side and 2+ on the left side. Skin: Skin is warm and dry.  Psychiatric: She has a normal mood and affect. Her behavior is normal.  Nursing note and vitals reviewed.    ED Treatments / Results  Labs (all labs ordered are listed, but only abnormal results are displayed) Labs Reviewed - No data to display  Radiology No results found.  Procedures Procedures (including critical care time)  Medications Ordered in ED Medications  cyclobenzaprine (FLEXERIL) tablet 10 mg (10 mg Oral Given 10/21/17 2140)     Initial Impression / Assessment and Plan / ED Course  I have reviewed the triage vital signs and the nursing notes. Patient with back pain.  No neurological deficits and normal neuro exam.  Patient can walk but states is painful.  No loss of bowel or bladder control.  No concern for cauda equina.  No fever, night sweats, weight loss, h/o cancer, IVDU.  RICE protocol and pain medicine indicated and discussed with patient.   Final Clinical Impressions(s) / ED Diagnoses   Final diagnoses:  Muscle spasm of back    ED Discharge Orders        Ordered    cyclobenzaprine (FLEXERIL) 5 MG tablet  2 times daily PRN     10/21/17  2150       Kerrie Buffaloeese, [place], NP 10/21/17 2154    Jacalyn LefevreHaviland, Julie, MD 10/21/17 2350

## 2017-10-21 NOTE — Discharge Instructions (Addendum)
Do not drive while taking the muscle relaxer as it will make you sleepy. Follow up with your regular doctor to discuss continued treatment of your pain. Return here as needed.

## 2017-10-21 NOTE — ED Triage Notes (Signed)
Pt reports lower back pain for a while. Pt states she has DDD and states it is getting harder for her to stand, wash dishes, etc.

## 2017-10-24 ENCOUNTER — Other Ambulatory Visit: Payer: Self-pay

## 2017-10-26 DIAGNOSIS — I10 Essential (primary) hypertension: Secondary | ICD-10-CM | POA: Diagnosis not present

## 2017-10-26 DIAGNOSIS — Z09 Encounter for follow-up examination after completed treatment for conditions other than malignant neoplasm: Secondary | ICD-10-CM | POA: Diagnosis not present

## 2017-10-26 DIAGNOSIS — M545 Low back pain: Secondary | ICD-10-CM | POA: Diagnosis not present

## 2017-11-23 DIAGNOSIS — E1169 Type 2 diabetes mellitus with other specified complication: Secondary | ICD-10-CM | POA: Diagnosis not present

## 2017-11-23 DIAGNOSIS — E1159 Type 2 diabetes mellitus with other circulatory complications: Secondary | ICD-10-CM | POA: Diagnosis not present

## 2017-11-23 DIAGNOSIS — I10 Essential (primary) hypertension: Secondary | ICD-10-CM | POA: Diagnosis not present

## 2017-11-23 DIAGNOSIS — E785 Hyperlipidemia, unspecified: Secondary | ICD-10-CM | POA: Diagnosis not present

## 2018-01-04 ENCOUNTER — Ambulatory Visit: Payer: BLUE CROSS/BLUE SHIELD | Admitting: "Endocrinology

## 2018-01-04 DIAGNOSIS — E1159 Type 2 diabetes mellitus with other circulatory complications: Secondary | ICD-10-CM | POA: Diagnosis not present

## 2018-01-04 DIAGNOSIS — E1169 Type 2 diabetes mellitus with other specified complication: Secondary | ICD-10-CM | POA: Diagnosis not present

## 2018-01-04 DIAGNOSIS — I10 Essential (primary) hypertension: Secondary | ICD-10-CM | POA: Diagnosis not present

## 2018-01-04 DIAGNOSIS — E785 Hyperlipidemia, unspecified: Secondary | ICD-10-CM | POA: Diagnosis not present

## 2018-01-30 ENCOUNTER — Ambulatory Visit: Payer: BLUE CROSS/BLUE SHIELD | Admitting: "Endocrinology

## 2018-02-22 DIAGNOSIS — I10 Essential (primary) hypertension: Secondary | ICD-10-CM | POA: Diagnosis not present

## 2018-02-22 DIAGNOSIS — E1159 Type 2 diabetes mellitus with other circulatory complications: Secondary | ICD-10-CM | POA: Diagnosis not present

## 2018-02-22 DIAGNOSIS — R300 Dysuria: Secondary | ICD-10-CM | POA: Diagnosis not present

## 2018-03-06 DIAGNOSIS — E1165 Type 2 diabetes mellitus with hyperglycemia: Secondary | ICD-10-CM | POA: Diagnosis not present

## 2018-03-08 DIAGNOSIS — I10 Essential (primary) hypertension: Secondary | ICD-10-CM | POA: Diagnosis not present

## 2018-03-08 DIAGNOSIS — E1159 Type 2 diabetes mellitus with other circulatory complications: Secondary | ICD-10-CM | POA: Diagnosis not present

## 2018-03-08 DIAGNOSIS — E1169 Type 2 diabetes mellitus with other specified complication: Secondary | ICD-10-CM | POA: Diagnosis not present

## 2018-03-08 DIAGNOSIS — E785 Hyperlipidemia, unspecified: Secondary | ICD-10-CM | POA: Diagnosis not present

## 2018-04-17 DIAGNOSIS — I10 Essential (primary) hypertension: Secondary | ICD-10-CM | POA: Diagnosis not present

## 2018-04-17 DIAGNOSIS — J40 Bronchitis, not specified as acute or chronic: Secondary | ICD-10-CM | POA: Diagnosis not present

## 2018-04-17 DIAGNOSIS — E1159 Type 2 diabetes mellitus with other circulatory complications: Secondary | ICD-10-CM | POA: Diagnosis not present

## 2018-04-17 DIAGNOSIS — H6692 Otitis media, unspecified, left ear: Secondary | ICD-10-CM | POA: Diagnosis not present

## 2018-06-13 DIAGNOSIS — E1169 Type 2 diabetes mellitus with other specified complication: Secondary | ICD-10-CM | POA: Diagnosis not present

## 2018-06-13 DIAGNOSIS — I10 Essential (primary) hypertension: Secondary | ICD-10-CM | POA: Diagnosis not present

## 2018-06-13 DIAGNOSIS — E785 Hyperlipidemia, unspecified: Secondary | ICD-10-CM | POA: Diagnosis not present

## 2018-06-13 DIAGNOSIS — E1159 Type 2 diabetes mellitus with other circulatory complications: Secondary | ICD-10-CM | POA: Diagnosis not present

## 2018-09-02 ENCOUNTER — Other Ambulatory Visit: Payer: Self-pay

## 2018-09-02 ENCOUNTER — Emergency Department (HOSPITAL_COMMUNITY)
Admission: EM | Admit: 2018-09-02 | Discharge: 2018-09-02 | Disposition: A | Discharge status: Home / Self Care | Payer: Medicare Other | Attending: Emergency Medicine | Admitting: Emergency Medicine

## 2018-09-02 ENCOUNTER — Encounter (HOSPITAL_COMMUNITY): Payer: Self-pay | Admitting: Emergency Medicine

## 2018-09-02 ENCOUNTER — Emergency Department (HOSPITAL_COMMUNITY): Payer: Medicare Other

## 2018-09-02 DIAGNOSIS — F419 Anxiety disorder, unspecified: Secondary | ICD-10-CM | POA: Diagnosis not present

## 2018-09-02 DIAGNOSIS — E119 Type 2 diabetes mellitus without complications: Secondary | ICD-10-CM | POA: Diagnosis not present

## 2018-09-02 DIAGNOSIS — Z794 Long term (current) use of insulin: Secondary | ICD-10-CM | POA: Diagnosis not present

## 2018-09-02 DIAGNOSIS — Y9389 Activity, other specified: Secondary | ICD-10-CM | POA: Diagnosis not present

## 2018-09-02 DIAGNOSIS — Y998 Other external cause status: Secondary | ICD-10-CM | POA: Diagnosis not present

## 2018-09-02 DIAGNOSIS — F329 Major depressive disorder, single episode, unspecified: Secondary | ICD-10-CM | POA: Insufficient documentation

## 2018-09-02 DIAGNOSIS — W0110XA Fall on same level from slipping, tripping and stumbling with subsequent striking against unspecified object, initial encounter: Secondary | ICD-10-CM | POA: Insufficient documentation

## 2018-09-02 DIAGNOSIS — J45909 Unspecified asthma, uncomplicated: Secondary | ICD-10-CM | POA: Diagnosis not present

## 2018-09-02 DIAGNOSIS — Z79899 Other long term (current) drug therapy: Secondary | ICD-10-CM | POA: Diagnosis not present

## 2018-09-02 DIAGNOSIS — J449 Chronic obstructive pulmonary disease, unspecified: Secondary | ICD-10-CM | POA: Insufficient documentation

## 2018-09-02 DIAGNOSIS — F1021 Alcohol dependence, in remission: Secondary | ICD-10-CM | POA: Diagnosis not present

## 2018-09-02 DIAGNOSIS — S022XXA Fracture of nasal bones, initial encounter for closed fracture: Secondary | ICD-10-CM | POA: Insufficient documentation

## 2018-09-02 DIAGNOSIS — F1411 Cocaine abuse, in remission: Secondary | ICD-10-CM | POA: Insufficient documentation

## 2018-09-02 DIAGNOSIS — Z87891 Personal history of nicotine dependence: Secondary | ICD-10-CM | POA: Insufficient documentation

## 2018-09-02 DIAGNOSIS — S0993XA Unspecified injury of face, initial encounter: Secondary | ICD-10-CM | POA: Diagnosis present

## 2018-09-02 DIAGNOSIS — W19XXXA Unspecified fall, initial encounter: Secondary | ICD-10-CM

## 2018-09-02 DIAGNOSIS — I10 Essential (primary) hypertension: Secondary | ICD-10-CM | POA: Diagnosis not present

## 2018-09-02 DIAGNOSIS — Y92009 Unspecified place in unspecified non-institutional (private) residence as the place of occurrence of the external cause: Secondary | ICD-10-CM | POA: Diagnosis not present

## 2018-09-02 NOTE — ED Provider Notes (Signed)
[facility] EMERGENCY DEPARTMENT Provider Note   CSN: [id number] Arrival date & time: 09/02/18  1525    History   Chief Complaint Chief Complaint  Patient presents with  . Fall    HPI Lindsey Melton is a 68 y.o. female.     Fall at home several days ago striking her face and chin.  She lives by her self.  She did not come to the emergency department after the initial event.  Complains of bruising on the face and a laceration on the right chin.  No other injuries.  No neuro deficits.  Severity of symptoms is moderate.  Nothing makes symptoms better or worse.     Past Medical History:  Diagnosis Date  . Anxiety   . Arthritis   . Asthma   . Cataract   . Cervical myelopathy (HCC)   . COPD (chronic obstructive pulmonary disease) (HCC)   . Depression   . Diabetes mellitus   . Headache   . History of hiatal hernia   . Hyperlipidemia   . Hypertension   . Kidney stones   . Neuromuscular disorder (HCC)   . Peripheral vertigo   . Substance abuse (HCC)   . Wears glasses     Patient Active Problem List   Diagnosis Date Noted  . Major depressive disorder 02/26/2016  . Cocaine abuse in remission (HCC) 02/26/2016  . Alcoholism in remission (HCC) 02/26/2016  . Healthcare maintenance 12/26/2015  . Cervical myelopathy with cervical radiculopathy 09/16/2015  . Benign essential HTN   . DM type 2 with diabetic peripheral neuropathy (HCC)   . Chronic obstructive pulmonary disease (HCC)   . Post-operative pain   . Acute blood loss anemia   . Dysphagia   . Myelopathy (HCC) 09/10/2015  . S/P cervical spinal fusion   . Surgery, elective   . Postprocedural hypotension   . Spondylosis, cervical, with myelopathy 08/24/2015  . Polysubstance abuse (HCC) 08/24/2015  . Cold extremities 08/08/2015  . Leg pain 08/08/2015  . Muscle weakness 07/29/2015  . Neck pain 07/25/2015  . Hyponatremia 07/25/2015    Past Surgical History:  Procedure Laterality Date  . ABDOMINAL HYSTERECTOMY     . ANTERIOR CERVICAL DECOMP/DISCECTOMY FUSION N/A 09/10/2015   Procedure: ANTERIOR C3-4 DECOMPRESSION AND FUSION;  Surgeon: Venita Lick, MD;  Location: [facility] OR;  Service: Orthopedics;  Laterality: N/A;  . APPENDECTOMY    . POSTERIOR CERVICAL FUSION/FORAMINOTOMY N/A 09/10/2015   Procedure: POSTERIOR CERVICAL FUSION/FORAMINOTOMY Cervical 3-4;  Surgeon: Venita Lick, MD;  Location: [facility] OR;  Service: Orthopedics;  Laterality: N/A;     OB History   No obstetric history on file.      Home Medications    Prior to Admission medications   Medication Sig Start Date End Date Taking? Authorizing Provider  atorvastatin (LIPITOR) 10 MG tablet TAKE ONE TABLET BY MOUTH ONCE DAILY. 08/13/16   Elenora Gamma, MD  cyclobenzaprine (FLEXERIL) 5 MG tablet Take 1 tablet (5 mg total) by mouth 2 (two) times daily as needed for muscle spasms. 10/21/17   Janne Napoleon, NP  fluticasone-salmeterol (ADVAIR HFA) [phone] MCG/ACT inhaler Inhale 2 puffs into the lungs 2 (two) times daily. 02/26/16   Elenora Gamma, MD  hydrOXYzine (ATARAX/VISTARIL) 25 MG tablet Take 25 mg by mouth. One tablet every morning, two at bedtime.    [provider]  insulin glargine (LANTUS) 100 UNIT/ML injection Inject 15 Units into the skin at bedtime.     [provider]  liraglutide  18 MG/3ML SOPN Inject 0.2 mLs (1.2 mg total) into the skin daily. Start with 0.1 mLs (0.6 mg) daily for 1 week, then increase to 0.2 mLs daily 05/06/16   Elenora Gamma, MD  lisinopril (PRINIVIL,ZESTRIL) 20 MG tablet Take 1 tablet (20 mg total) by mouth daily. 05/06/16   Elenora Gamma, MD  pregabalin (LYRICA) 75 MG capsule Take 1 capsule (75 mg total) by mouth 2 (two) times daily. For 5 days, then one daily. 06/23/16   Mechele Claude, MD  RaNITidine HCl (ZANTAC PO) Take 150 mg by mouth.    [provider]  topiramate (TOPAMAX) 100 MG tablet Take 1 tablet (100 mg total) by mouth 2 (two) times daily. 07/28/15   Elenora Gamma,  MD  venlafaxine (EFFEXOR) 75 MG tablet Take 75 mg by mouth daily.     [provider]  VENTOLIN HFA 108 (90 Base) MCG/ACT inhaler INHALE 2 PUFFS INTO THE LUNGS EVERY 4 HOURS AS NEEDED. 07/13/16   Elenora Gamma, MD    Family History Family History  Problem Relation Age of Onset  . Arthritis Mother   . Diabetes Mother   . Hearing loss Mother   . Heart disease Mother   . Hypertension Mother   . Vision loss Mother   . Stroke Mother   . Asthma Mother   . Heart disease Father   . Depression Daughter   . Mental illness Daughter   . Vision loss Daughter   . Anxiety disorder Daughter   . Alcohol abuse Son   . Drug abuse Son   . Anxiety disorder Son     Social History Social History   Tobacco Use  . Smoking status: Former Smoker    Types: Cigarettes  . Smokeless tobacco: Never Used  Substance Use Topics  . Alcohol use: Not Currently    Comment: last drank " a couple of beers" in December 2016  . Drug use: Not Currently    Types: "Crack" cocaine    Comment: last used crack cocaine in December 2016     Allergies   Prozac [fluoxetine hcl]   Review of Systems Review of Systems  All other systems reviewed and are negative.    Physical Exam Updated Vital Signs BP (!) 160/107 (BP Location: Right Arm)   Pulse (!) 105   Temp 98.5 F (36.9 C) (Temporal)   Resp 14   Ht 5\' 1"  (1.549 m)   Wt 76.7 kg   SpO2 99%   BMI 31.93 kg/m   Physical Exam Vitals signs and nursing note reviewed.  Constitutional:      Appearance: She is well-developed.     Comments: nad  HENT:     Head: Normocephalic.     Comments: Extensive bruising throughout the entire face.  Nose minimally offset to the right.  2.5 cm V shaped laceration right chin Eyes:     Conjunctiva/sclera: Conjunctivae normal.  Neck:     Musculoskeletal: Neck supple.  Cardiovascular:     Rate and Rhythm: Normal rate and regular rhythm.  Pulmonary:     Effort: Pulmonary effort is normal.     Breath  sounds: Normal breath sounds.  Abdominal:     General: Bowel sounds are normal.     Palpations: Abdomen is soft.  Musculoskeletal: Normal range of motion.  Skin:    General: Skin is warm and dry.  Neurological:     Mental Status: She is alert and oriented to person, place, and time.  Psychiatric:        Behavior: Behavior normal.      ED Treatments / Results  Labs (all labs ordered are listed, but only abnormal results are displayed) Labs Reviewed - No data to display  EKG None  Radiology Ct Maxillofacial Wo Contrast  Result Date: 09/02/2018 CLINICAL DATA:  Fall, facial trauma EXAM: CT MAXILLOFACIAL WITHOUT CONTRAST TECHNIQUE: Multidetector CT imaging of the maxillofacial structures was performed. Multiplanar CT image reconstructions were also generated. COMPARISON:  None. FINDINGS: Osseous: Fracture noted through the left nasal bone at the nasal maxillary suture with slight depression. No additional facial fracture. Orbits: Negative. No traumatic or inflammatory finding. Sinuses: Clear Soft tissues: Mild soft tissue swelling of the left orbit and face. Limited intracranial: No acute findings IMPRESSION: Fracture through the left nasal bones with slight depression. Electronically Signed   By: Charlett Nose M.D.   On: 09/02/2018 17:25    Procedures Procedures (including critical care time)  Medications Ordered in ED Medications - No data to display   Initial Impression / Assessment and Plan / ED Course  I have reviewed the triage vital signs and the nursing notes.  Pertinent labs & imaging results that were available during my care of the patient were reviewed by me and considered in my medical decision making (see chart for details).        Patient presents with a fall and facial trauma.  CT maxillofacial reveals a fracture of the left nasal bone with slight depression.  She does have a 2.5 cm laceration on the right chin.  It is too "old" to repair in the ED.  Referral to  ENT for nasal fracture and possible surgical repair of laceration  Final Clinical Impressions(s) / ED Diagnoses   Final diagnoses:  Fall, initial encounter  Closed fracture of nasal bone, initial encounter    ED Discharge Orders    None       Donnetta Hutching, MD 09/02/18 [phone]

## 2018-09-02 NOTE — ED Triage Notes (Signed)
Pt reports she fell on Thursday possibly, pt reports LOC and does not remember the fall or what she was doing prior to fall. She lives by herself, denies blood thinner use. Extensive bruising to face and laceration to R side of chin.

## 2018-09-02 NOTE — Discharge Instructions (Addendum)
You have broken a bone in your nose.  No intervention is necessary at this time.  Follow-up with ear nose and throat doctor.  No suturing at this time.  You may need a surgical repair in the future

## 2018-09-19 DIAGNOSIS — K219 Gastro-esophageal reflux disease without esophagitis: Secondary | ICD-10-CM | POA: Insufficient documentation

## 2018-09-19 DIAGNOSIS — E1169 Type 2 diabetes mellitus with other specified complication: Secondary | ICD-10-CM | POA: Diagnosis not present

## 2018-09-19 DIAGNOSIS — J449 Chronic obstructive pulmonary disease, unspecified: Secondary | ICD-10-CM | POA: Diagnosis not present

## 2018-09-19 DIAGNOSIS — E785 Hyperlipidemia, unspecified: Secondary | ICD-10-CM | POA: Diagnosis not present

## 2018-09-19 DIAGNOSIS — E1159 Type 2 diabetes mellitus with other circulatory complications: Secondary | ICD-10-CM | POA: Diagnosis not present

## 2018-10-23 DIAGNOSIS — J301 Allergic rhinitis due to pollen: Secondary | ICD-10-CM | POA: Insufficient documentation

## 2018-10-23 DIAGNOSIS — J449 Chronic obstructive pulmonary disease, unspecified: Secondary | ICD-10-CM | POA: Diagnosis not present

## 2018-11-02 DIAGNOSIS — E785 Hyperlipidemia, unspecified: Secondary | ICD-10-CM | POA: Diagnosis not present

## 2018-11-02 DIAGNOSIS — E1169 Type 2 diabetes mellitus with other specified complication: Secondary | ICD-10-CM | POA: Diagnosis not present

## 2018-11-02 DIAGNOSIS — I10 Essential (primary) hypertension: Secondary | ICD-10-CM | POA: Diagnosis not present

## 2018-11-02 DIAGNOSIS — E1159 Type 2 diabetes mellitus with other circulatory complications: Secondary | ICD-10-CM | POA: Diagnosis not present

## 2018-11-02 DIAGNOSIS — J449 Chronic obstructive pulmonary disease, unspecified: Secondary | ICD-10-CM | POA: Diagnosis not present

## 2018-11-06 DIAGNOSIS — J209 Acute bronchitis, unspecified: Secondary | ICD-10-CM | POA: Diagnosis not present

## 2018-11-13 DIAGNOSIS — J449 Chronic obstructive pulmonary disease, unspecified: Secondary | ICD-10-CM | POA: Diagnosis not present

## 2018-11-13 DIAGNOSIS — I10 Essential (primary) hypertension: Secondary | ICD-10-CM | POA: Diagnosis not present

## 2018-11-13 DIAGNOSIS — J209 Acute bronchitis, unspecified: Secondary | ICD-10-CM | POA: Diagnosis not present

## 2018-11-13 DIAGNOSIS — E1159 Type 2 diabetes mellitus with other circulatory complications: Secondary | ICD-10-CM | POA: Diagnosis not present

## 2018-12-16 DIAGNOSIS — W19XXXA Unspecified fall, initial encounter: Secondary | ICD-10-CM | POA: Diagnosis not present

## 2018-12-16 DIAGNOSIS — I10 Essential (primary) hypertension: Secondary | ICD-10-CM | POA: Diagnosis not present

## 2018-12-16 DIAGNOSIS — R609 Edema, unspecified: Secondary | ICD-10-CM | POA: Diagnosis not present

## 2018-12-21 DIAGNOSIS — E785 Hyperlipidemia, unspecified: Secondary | ICD-10-CM | POA: Diagnosis not present

## 2018-12-21 DIAGNOSIS — E1169 Type 2 diabetes mellitus with other specified complication: Secondary | ICD-10-CM | POA: Diagnosis not present

## 2018-12-21 DIAGNOSIS — E1159 Type 2 diabetes mellitus with other circulatory complications: Secondary | ICD-10-CM | POA: Diagnosis not present

## 2018-12-21 DIAGNOSIS — I10 Essential (primary) hypertension: Secondary | ICD-10-CM | POA: Diagnosis not present

## 2018-12-21 DIAGNOSIS — J449 Chronic obstructive pulmonary disease, unspecified: Secondary | ICD-10-CM | POA: Diagnosis not present

## 2019-01-24 DIAGNOSIS — J209 Acute bronchitis, unspecified: Secondary | ICD-10-CM | POA: Diagnosis not present

## 2019-03-02 DIAGNOSIS — E1159 Type 2 diabetes mellitus with other circulatory complications: Secondary | ICD-10-CM | POA: Diagnosis not present

## 2019-03-02 DIAGNOSIS — J449 Chronic obstructive pulmonary disease, unspecified: Secondary | ICD-10-CM | POA: Diagnosis not present

## 2019-03-02 DIAGNOSIS — M5432 Sciatica, left side: Secondary | ICD-10-CM | POA: Diagnosis not present

## 2019-03-02 DIAGNOSIS — I10 Essential (primary) hypertension: Secondary | ICD-10-CM | POA: Diagnosis not present

## 2019-03-21 DIAGNOSIS — H25812 Combined forms of age-related cataract, left eye: Secondary | ICD-10-CM | POA: Insufficient documentation

## 2019-04-24 DIAGNOSIS — E113299 Type 2 diabetes mellitus with mild nonproliferative diabetic retinopathy without macular edema, unspecified eye: Secondary | ICD-10-CM | POA: Insufficient documentation

## 2019-04-24 DIAGNOSIS — H353132 Nonexudative age-related macular degeneration, bilateral, intermediate dry stage: Secondary | ICD-10-CM | POA: Insufficient documentation

## 2019-04-24 DIAGNOSIS — H401422 Capsular glaucoma with pseudoexfoliation of lens, left eye, moderate stage: Secondary | ICD-10-CM | POA: Insufficient documentation

## 2019-06-08 ENCOUNTER — Other Ambulatory Visit: Payer: Self-pay

## 2019-06-08 ENCOUNTER — Emergency Department (HOSPITAL_COMMUNITY)
Admission: EM | Admit: 2019-06-08 | Discharge: 2019-06-08 | Disposition: A | Discharge status: Home / Self Care | Payer: Medicare Other

## 2019-06-12 ENCOUNTER — Other Ambulatory Visit: Payer: Self-pay

## 2019-06-12 DIAGNOSIS — Z20822 Contact with and (suspected) exposure to covid-19: Secondary | ICD-10-CM

## 2019-06-13 ENCOUNTER — Telehealth: Payer: Self-pay

## 2019-06-13 NOTE — Telephone Encounter (Signed)
Caller advised that result are not back yet 

## 2019-06-14 ENCOUNTER — Telehealth: Payer: Self-pay | Admitting: Family Medicine

## 2019-06-14 LAB — NOVEL CORONAVIRUS, NAA: SARS-CoV-2, NAA: NOT DETECTED

## 2019-06-14 NOTE — Telephone Encounter (Signed)
Negative COVID results given. Patient results "NOT Detected." Caller expressed understanding. ° °

## 2019-10-11 IMAGING — CT CT MAXILLOFACIAL W/O CM
3 series · 16 of 47 positions shown, 19 images · non-contrast
Comparison: None.

CLINICAL DATA: Fall, facial trauma

EXAM:
CT MAXILLOFACIAL WITHOUT CONTRAST
TECHNIQUE: Multidetector CT imaging of the maxillofacial structures was
performed. Multiplanar CT image reconstructions were also generated.

[Series 2: max soft · axial · 0.39mm/px · z∈[-58,+104]mm · 10 of 95 slices shown, 13 images]
[im 7/95  brain]
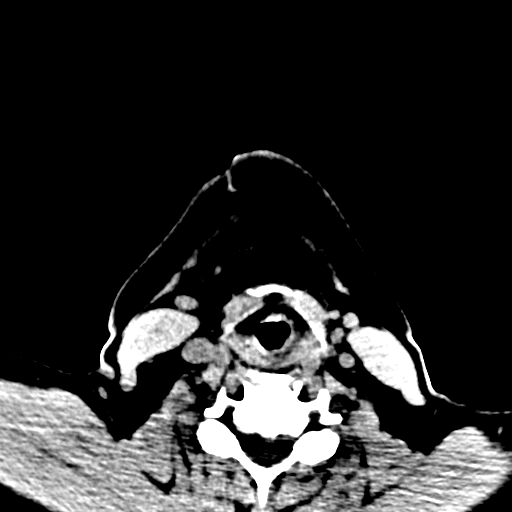
[im 7/95  bone]
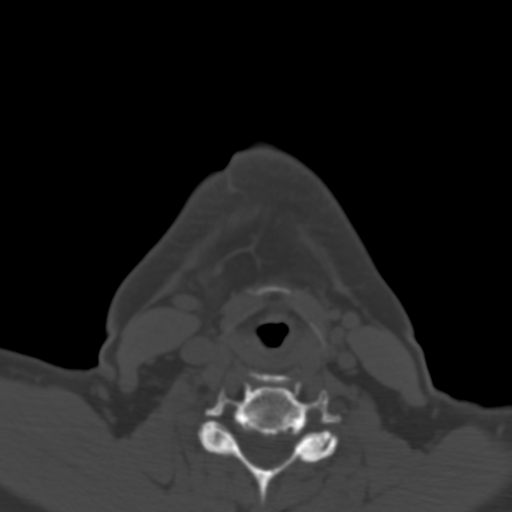
[im 17/95  bone]
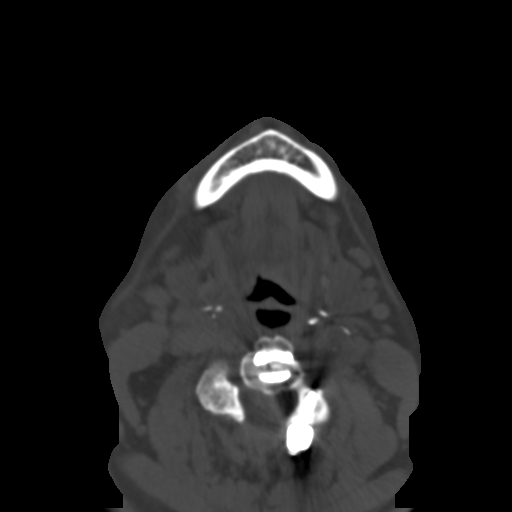
[im 26/95  bone]
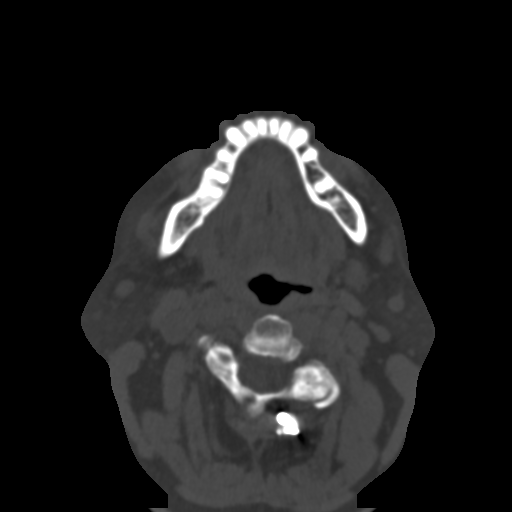
[im 33/95  bone]
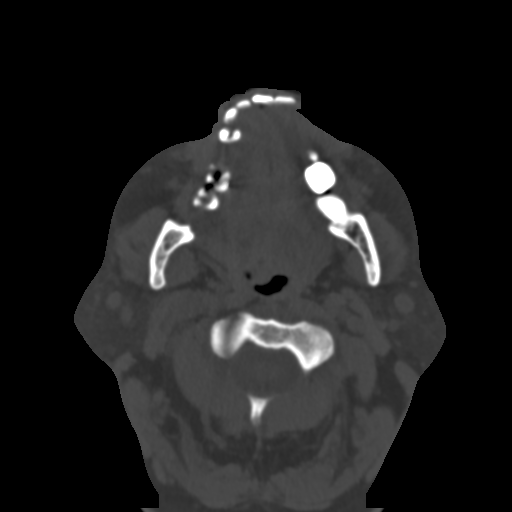
[im 43/95  brain]
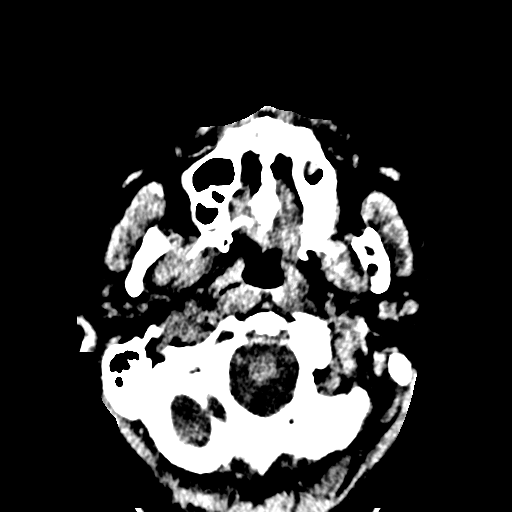
[im 43/95  bone]
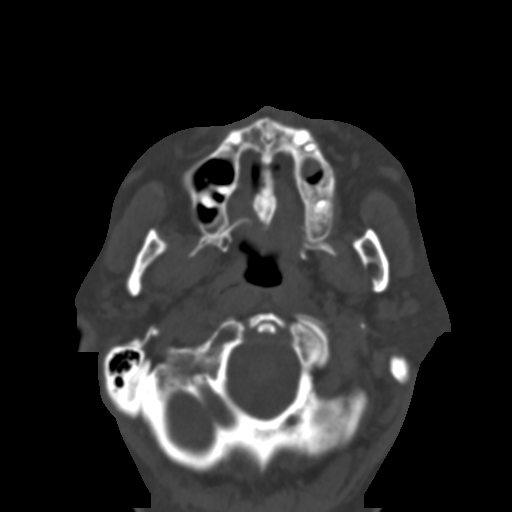
[im 52/95  bone]
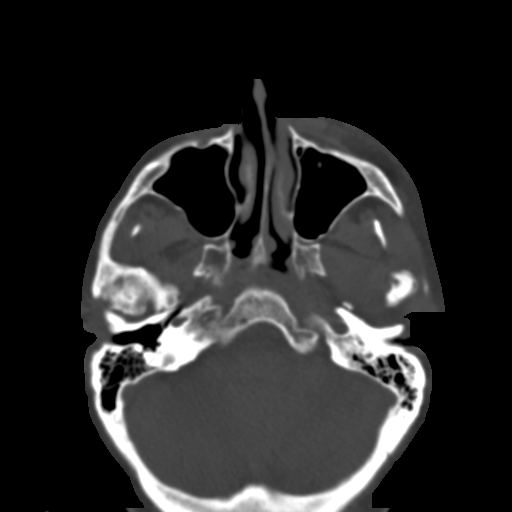
[im 62/95  bone]
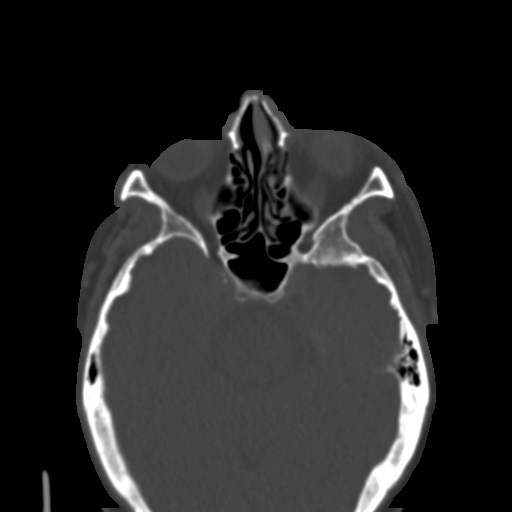
[im 72/95  bone]
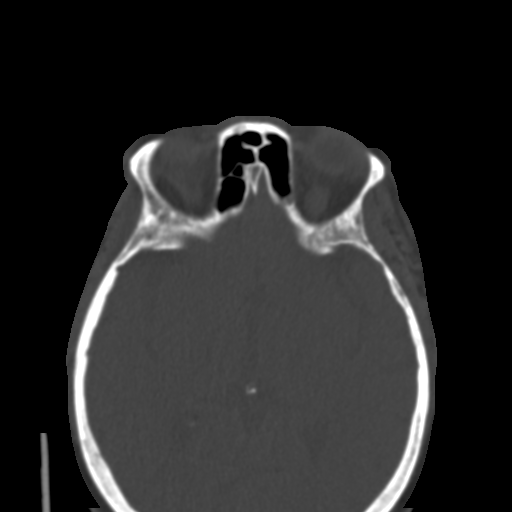
[im 78/95  brain]
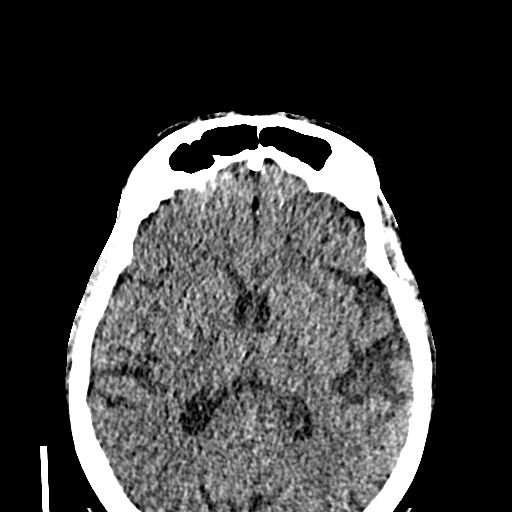
[im 78/95  bone]
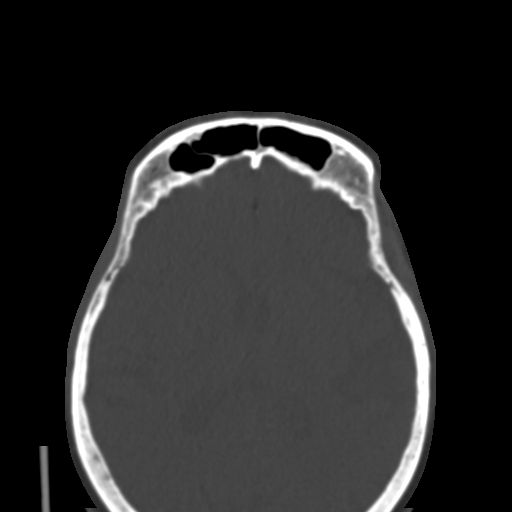
[im 88/95  bone]
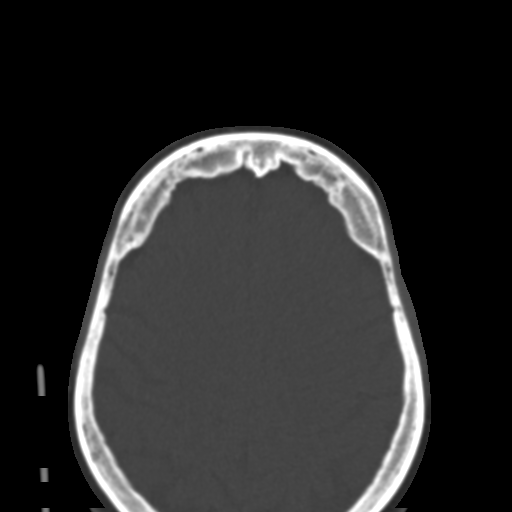

[Series 6: coronal soft · coronal · 0.38mm/px · 3 of 72 slices shown]
[im 24/72  bone]
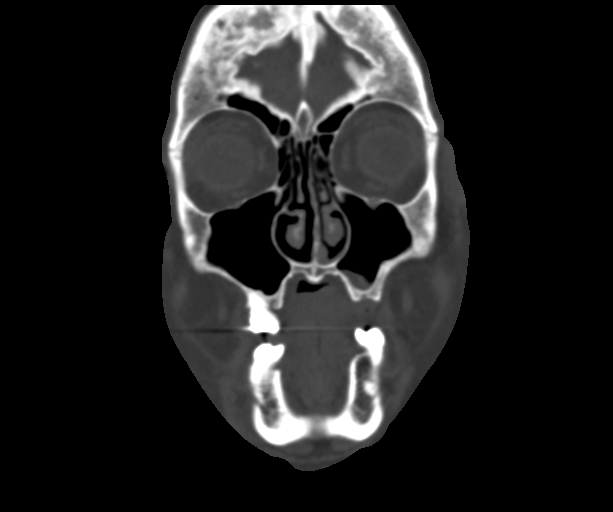
[im 32/72  bone]
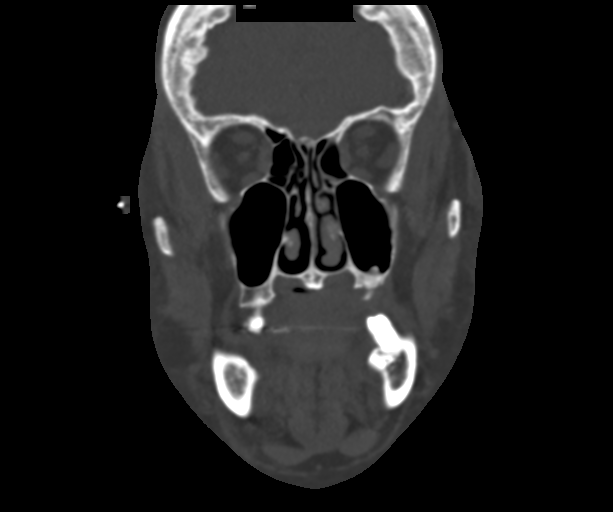
[im 40/72  bone]
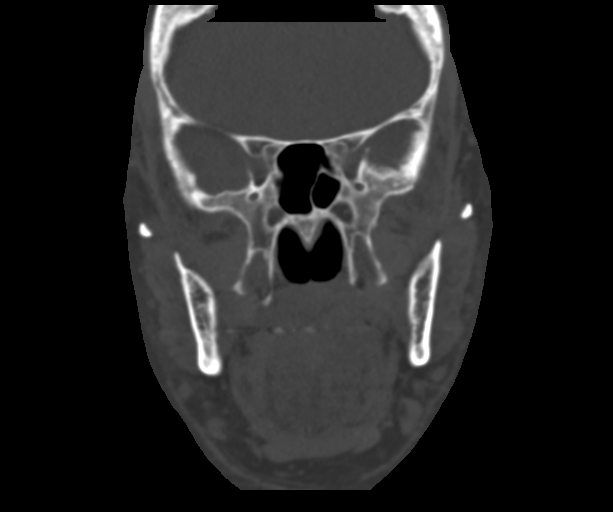

[Series 7: sagittal soft · sagittal · 0.35mm/px · 3 of 85 slices shown]
[im 29/85  bone]
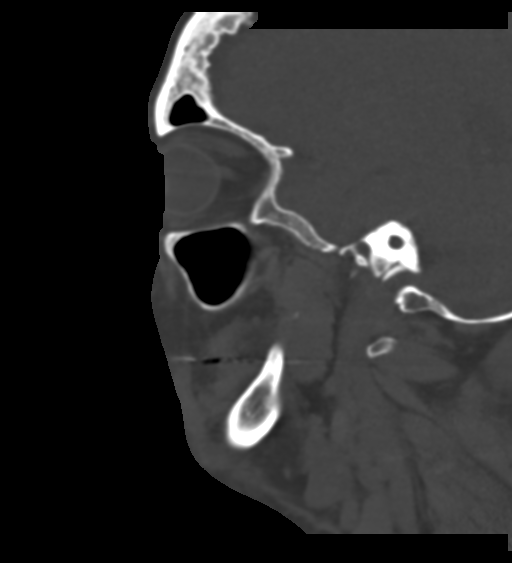
[im 43/85  bone]
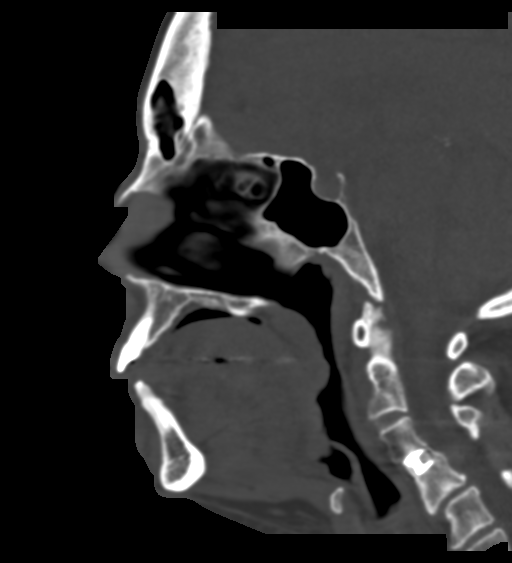
[im 57/85  bone]
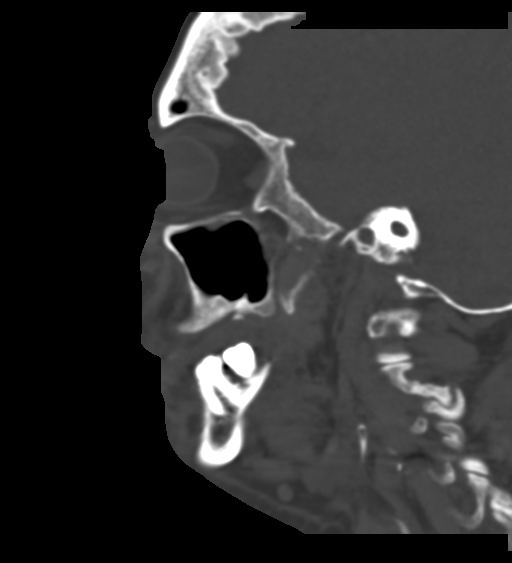

[16 of 47 positions shown; findings below may reference images not displayed]

FINDINGS: Osseous: Fracture noted through the left nasal bone at the nasal
maxillary suture with slight depression. No additional facial
fracture.

Orbits: Negative. No traumatic or inflammatory finding.

Sinuses: Clear

Soft tissues: Mild soft tissue swelling of the left orbit and face.

Limited intracranial: No acute findings
IMPRESSION: Fracture through the left nasal bones with slight depression.

## 2019-10-14 ENCOUNTER — Emergency Department (HOSPITAL_COMMUNITY): Payer: Medicare Other

## 2019-10-14 ENCOUNTER — Other Ambulatory Visit: Payer: Self-pay

## 2019-10-14 ENCOUNTER — Emergency Department (HOSPITAL_COMMUNITY)
Admission: EM | Admit: 2019-10-14 | Discharge: 2019-10-15 | Disposition: A | Discharge status: Home / Self Care | Payer: Medicare Other | Attending: Emergency Medicine | Admitting: Emergency Medicine

## 2019-10-14 ENCOUNTER — Encounter (HOSPITAL_COMMUNITY): Payer: Self-pay | Admitting: *Deleted

## 2019-10-14 DIAGNOSIS — S01112A Laceration without foreign body of left eyelid and periocular area, initial encounter: Secondary | ICD-10-CM | POA: Diagnosis not present

## 2019-10-14 DIAGNOSIS — E119 Type 2 diabetes mellitus without complications: Secondary | ICD-10-CM | POA: Insufficient documentation

## 2019-10-14 DIAGNOSIS — Y905 Blood alcohol level of 100-119 mg/100 ml: Secondary | ICD-10-CM | POA: Insufficient documentation

## 2019-10-14 DIAGNOSIS — S0181XA Laceration without foreign body of other part of head, initial encounter: Secondary | ICD-10-CM

## 2019-10-14 DIAGNOSIS — F10929 Alcohol use, unspecified with intoxication, unspecified: Secondary | ICD-10-CM | POA: Insufficient documentation

## 2019-10-14 DIAGNOSIS — S62647A Nondisplaced fracture of proximal phalanx of left little finger, initial encounter for closed fracture: Secondary | ICD-10-CM | POA: Diagnosis not present

## 2019-10-14 DIAGNOSIS — Z79899 Other long term (current) drug therapy: Secondary | ICD-10-CM | POA: Diagnosis not present

## 2019-10-14 DIAGNOSIS — J449 Chronic obstructive pulmonary disease, unspecified: Secondary | ICD-10-CM | POA: Diagnosis not present

## 2019-10-14 DIAGNOSIS — Z23 Encounter for immunization: Secondary | ICD-10-CM | POA: Insufficient documentation

## 2019-10-14 DIAGNOSIS — S0990XA Unspecified injury of head, initial encounter: Secondary | ICD-10-CM | POA: Diagnosis not present

## 2019-10-14 DIAGNOSIS — Y929 Unspecified place or not applicable: Secondary | ICD-10-CM | POA: Insufficient documentation

## 2019-10-14 DIAGNOSIS — W010XXA Fall on same level from slipping, tripping and stumbling without subsequent striking against object, initial encounter: Secondary | ICD-10-CM | POA: Insufficient documentation

## 2019-10-14 DIAGNOSIS — F10920 Alcohol use, unspecified with intoxication, uncomplicated: Secondary | ICD-10-CM

## 2019-10-14 DIAGNOSIS — Y939 Activity, unspecified: Secondary | ICD-10-CM | POA: Diagnosis not present

## 2019-10-14 DIAGNOSIS — Z794 Long term (current) use of insulin: Secondary | ICD-10-CM | POA: Insufficient documentation

## 2019-10-14 DIAGNOSIS — W19XXXA Unspecified fall, initial encounter: Secondary | ICD-10-CM

## 2019-10-14 DIAGNOSIS — R52 Pain, unspecified: Secondary | ICD-10-CM

## 2019-10-14 DIAGNOSIS — Y999 Unspecified external cause status: Secondary | ICD-10-CM | POA: Diagnosis not present

## 2019-10-14 DIAGNOSIS — I10 Essential (primary) hypertension: Secondary | ICD-10-CM | POA: Diagnosis not present

## 2019-10-14 DIAGNOSIS — Z87891 Personal history of nicotine dependence: Secondary | ICD-10-CM | POA: Diagnosis not present

## 2019-10-14 LAB — BASIC METABOLIC PANEL
Anion gap: 10 (ref 5–15)
BUN: 22 mg/dL (ref 8–23)
CO2: 18 mmol/L — ABNORMAL LOW (ref 22–32)
Calcium: 8.7 mg/dL — ABNORMAL LOW (ref 8.9–10.3)
Chloride: 112 mmol/L — ABNORMAL HIGH (ref 98–111)
Creatinine, Ser: 0.81 mg/dL (ref 0.44–1.00)
GFR calc Af Amer: >60 mL/min (ref >60)
GFR calc non Af Amer: >60 mL/min (ref >60)
Glucose, Bld: 135 mg/dL — ABNORMAL HIGH (ref 70–99)
Potassium: 3.7 mmol/L (ref 3.5–5.1)
Sodium: 140 mmol/L (ref 135–145)

## 2019-10-14 LAB — CBC WITH DIFFERENTIAL/PLATELET
Abs Immature Granulocytes: 0.03 10*3/uL (ref 0.00–0.07)
Basophils Absolute: 0.1 10*3/uL (ref 0.0–0.1)
Basophils Relative: 1 %
Eosinophils Absolute: 0.4 10*3/uL (ref 0.0–0.5)
Eosinophils Relative: 4 %
HCT: 39.5 % (ref 36.0–46.0)
Hemoglobin: 13 g/dL (ref 12.0–15.0)
Immature Granulocytes: 0 %
Lymphocytes Relative: 20 %
Lymphs Abs: 1.7 10*3/uL (ref 0.7–4.0)
MCH: 29.8 pg (ref 26.0–34.0)
MCHC: 32.9 g/dL (ref 30.0–36.0)
MCV: 90.6 fL (ref 80.0–100.0)
Monocytes Absolute: 0.7 10*3/uL (ref 0.1–1.0)
Monocytes Relative: 8 %
Neutro Abs: 5.6 10*3/uL (ref 1.7–7.7)
Neutrophils Relative %: 67 %
Platelets: 339 10*3/uL (ref 150–400)
RBC: 4.36 MIL/uL (ref 3.87–5.11)
RDW: 12.9 % (ref 11.5–15.5)
WBC: 8.3 10*3/uL (ref 4.0–10.5)
nRBC: 0 % (ref 0.0–0.2)

## 2019-10-14 LAB — ETHANOL: Alcohol, Ethyl (B): 116 mg/dL — ABNORMAL HIGH (ref <10)

## 2019-10-14 MED ORDER — LIDOCAINE-EPINEPHRINE (PF) 2 %-1:200000 IJ SOLN
10.0000 mL | Freq: Once | INTRAMUSCULAR | Status: AC
  Administered 2019-10-14: 10 mL
  Filled 2019-10-14: qty 10

## 2019-10-14 MED ORDER — TETANUS-DIPHTH-ACELL PERTUSSIS 5-2.5-18.5 LF-MCG/0.5 IM SUSP
0.5000 mL | Freq: Once | INTRAMUSCULAR | Status: AC
  Administered 2019-10-14: 23:00:00 0.5 mL via INTRAMUSCULAR
  Filled 2019-10-14: qty 0.5

## 2019-10-14 NOTE — Discharge Instructions (Signed)
You were seen in the emergency department for evaluation of injuries from a fall.  You had a CAT scan of your head and cervical spine along with x-rays of your right hand and wrist.  The only significant findings were some bruising over your left eye where you had a laceration and a fracture of your fifth finger on your left hand.  A splint was applied and your laceration was sutured closed.  You will need to get the sutures removed in 5 to 7 days.  You can use soap and water to the area.  You will need to keep the splint on and follow-up with orthopedic hand surgery within 1 week.  Please call for appointment.

## 2019-10-14 NOTE — ED Provider Notes (Signed)
[facility] EMERGENCY DEPARTMENT Provider Note   CSN: [id number] Arrival date & time: 10/14/19  2115     History Chief Complaint  Patient presents with  . Fall    Lindsey Melton is a 69 y.o. female.  She has a history of diabetes and COPD.  She had a fall at home today unwitnessed.  She called ambulance.  Had been drinking since after lunch due to argument with a family member.  Patient does not recall the fall.  Tearful here.  Denies any complaints.  The history is provided by the patient and the EMS personnel.  Fall This is a new problem. The current episode started 1 to 2 hours ago. The problem has not changed since onset.Pertinent negatives include no chest pain, no abdominal pain, no headaches and no shortness of breath. Nothing aggravates the symptoms. Nothing relieves the symptoms. She has tried nothing for the symptoms. The treatment provided no relief.       Past Medical History:  Diagnosis Date  . Anxiety   . Arthritis   . Asthma   . Cataract   . Cervical myelopathy ([place])   . COPD (chronic obstructive pulmonary disease) ([place])   . Depression   . Diabetes mellitus   . Headache   . History of hiatal hernia   . Hyperlipidemia   . Hypertension   . Kidney stones   . Neuromuscular disorder ([place])   . Peripheral vertigo   . Substance abuse ([place])   . Wears glasses     Patient Active Problem List   Diagnosis Date Noted  . Major depressive disorder 02/26/2016  . Cocaine abuse in remission ([place]) 02/26/2016  . Alcoholism in remission ([place]) 02/26/2016  . Healthcare maintenance 12/26/2015  . Cervical myelopathy with cervical radiculopathy 09/16/2015  . Benign essential HTN   . DM type 2 with diabetic peripheral neuropathy ([place])   . Chronic obstructive pulmonary disease ([place])   . Post-operative pain   . Acute blood loss anemia   . Dysphagia   . Myelopathy ([place]) 09/10/2015  . S/P cervical spinal fusion   . Surgery, elective   . Postprocedural hypotension   .  Spondylosis, cervical, with myelopathy 08/24/2015  . Polysubstance abuse ([place]) 08/24/2015  . Cold extremities 08/08/2015  . Leg pain 08/08/2015  . Muscle weakness 07/29/2015  . Neck pain 07/25/2015  . Hyponatremia 07/25/2015    Past Surgical History:  Procedure Laterality Date  . ABDOMINAL HYSTERECTOMY    . ANTERIOR CERVICAL DECOMP/DISCECTOMY FUSION N/A 09/10/2015   Procedure: ANTERIOR C3-4 DECOMPRESSION AND FUSION;  Surgeon: Melina Schools, MD;  Location: [place];  Service: Orthopedics;  Laterality: N/A;  . APPENDECTOMY    . POSTERIOR CERVICAL FUSION/FORAMINOTOMY N/A 09/10/2015   Procedure: POSTERIOR CERVICAL FUSION/FORAMINOTOMY Cervical 3-4;  Surgeon: Melina Schools, MD;  Location: [place];  Service: Orthopedics;  Laterality: N/A;     OB History   No obstetric history on file.     Family History  Problem Relation Age of Onset  . Arthritis Mother   . Diabetes Mother   . Hearing loss Mother   . Heart disease Mother   . Hypertension Mother   . Vision loss Mother   . Stroke Mother   . Asthma Mother   . Heart disease Father   . Depression Daughter   . Mental illness Daughter   . Vision loss Daughter   . Anxiety disorder Daughter   . Alcohol abuse Son   . Drug abuse Son   .  Anxiety disorder Son     Social History   Tobacco Use  . Smoking status: Former Smoker    Types: Cigarettes  . Smokeless tobacco: Never Used  Substance Use Topics  . Alcohol use: Not Currently    Comment: last drank " a couple of beers" in December 2016  . Drug use: Not Currently    Types: "Crack" cocaine    Comment: last used crack cocaine in December 2016    Home Medications Prior to Admission medications   Medication Sig Start Date End Date Taking? Authorizing Provider  atorvastatin (LIPITOR) 10 MG tablet TAKE ONE TABLET BY MOUTH ONCE DAILY. 08/13/16   Elenora Gamma, MD  cyclobenzaprine (FLEXERIL) 5 MG tablet Take 1 tablet (5 mg total) by mouth 2 (two) times daily as needed for muscle spasms.  10/21/17   Janne Napoleon, NP  fluticasone-salmeterol (ADVAIR HFA) [phone] MCG/ACT inhaler Inhale 2 puffs into the lungs 2 (two) times daily. 02/26/16   Elenora Gamma, MD  hydrOXYzine (ATARAX/VISTARIL) 25 MG tablet Take 25 mg by mouth. One tablet every morning, two at bedtime.    [provider]  insulin glargine (LANTUS) 100 UNIT/ML injection Inject 15 Units into the skin at bedtime.     [provider]  liraglutide 18 MG/3ML SOPN Inject 0.2 mLs (1.2 mg total) into the skin daily. Start with 0.1 mLs (0.6 mg) daily for 1 week, then increase to 0.2 mLs daily 05/06/16   Elenora Gamma, MD  lisinopril (PRINIVIL,ZESTRIL) 20 MG tablet Take 1 tablet (20 mg total) by mouth daily. 05/06/16   Elenora Gamma, MD  pregabalin (LYRICA) 75 MG capsule Take 1 capsule (75 mg total) by mouth 2 (two) times daily. For 5 days, then one daily. 06/23/16   Mechele Claude, MD  RaNITidine HCl (ZANTAC PO) Take 150 mg by mouth.    [provider]  topiramate (TOPAMAX) 100 MG tablet Take 1 tablet (100 mg total) by mouth 2 (two) times daily. 07/28/15   Elenora Gamma, MD  venlafaxine (EFFEXOR) 75 MG tablet Take 75 mg by mouth daily.     [provider]  VENTOLIN HFA 108 (90 Base) MCG/ACT inhaler INHALE 2 PUFFS INTO THE LUNGS EVERY 4 HOURS AS NEEDED. 07/13/16   Elenora Gamma, MD    Allergies    Prozac [fluoxetine hcl]  Review of Systems   Review of Systems  Constitutional: Negative for fever.  HENT: Negative for sore throat.   Eyes: Negative for visual disturbance.  Respiratory: Negative for shortness of breath.   Cardiovascular: Negative for chest pain.  Gastrointestinal: Negative for abdominal pain.  Genitourinary: Negative for dysuria.  Musculoskeletal: Negative for neck pain.  Skin: Positive for wound. Negative for rash.  Neurological: Negative for headaches.    Physical Exam Updated Vital Signs BP 136/73 (BP Location: Right Arm)   Pulse 98   Temp 98.3 F  (36.8 C) (Oral)   Resp 19   Ht 5\' 1"  (1.549 m)   Wt 72.6 kg   SpO2 99%   BMI 30.23 kg/m   Physical Exam Vitals and nursing note reviewed.  Constitutional:      General: She is not in acute distress.    Appearance: She is well-developed.  HENT:     Head: Normocephalic.     Comments: Laceration above left eye approximately 3 cm.  No bony crepitus.  Eyes:     Conjunctiva/sclera: Conjunctivae normal.  Neck:     Comments: In cervical collar  no midline tenderness of cervical thoracic or lumbar spine. Cardiovascular:     Rate and Rhythm: Normal rate and regular rhythm.     Heart sounds: No murmur.  Pulmonary:     Effort: Pulmonary effort is normal. No respiratory distress.     Breath sounds: Normal breath sounds.  Abdominal:     Palpations: Abdomen is soft.     Tenderness: There is no abdominal tenderness.  Musculoskeletal:        General: Tenderness and signs of injury present.     Comments: Full range of motion of right upper and lower extremity without any pain or limitation.  She has some bruising over her lateral left hand and some abrasions over the same area and her lateral wrist.  Full range of motion with some tenderness.  Nontender left elbow and shoulder.  Full range of motion nontender left hip and knee and ankle.  Few abrasions over her knees.  No swelling.  Skin:    General: Skin is warm and dry.     Capillary Refill: Capillary refill takes less than 2 seconds.  Neurological:     General: No focal deficit present.     Mental Status: She is alert.     Sensory: No sensory deficit.     Motor: No weakness.     ED Results / Procedures / Treatments   Labs (all labs ordered are listed, but only abnormal results are displayed) Labs Reviewed  BASIC METABOLIC PANEL - Abnormal; Notable for the following components:      Result Value   Chloride 112 (*)    CO2 18 (*)    Glucose, Bld 135 (*)    Calcium 8.7 (*)    All other components within normal limits  ETHANOL -  Abnormal; Notable for the following components:   Alcohol, Ethyl (B) 116 (*)    All other components within normal limits  CBC WITH DIFFERENTIAL/PLATELET    EKG None  Radiology DG Wrist Complete Left  Result Date: 10/14/2019 CLINICAL DATA:  Left hand and wrist pain after fall. Bruising. Fall at home. EXAM: LEFT WRIST - COMPLETE 3+ VIEW COMPARISON:  None. FINDINGS: There is no evidence of fracture or dislocation. There is no evidence of arthropathy or other focal bone abnormality. Soft tissues are unremarkable. IMPRESSION: No fracture or dislocation of the left wrist. Electronically Signed   By: Narda Rutherford M.D.   On: 10/14/2019 22:20   CT Head Wo Contrast  Result Date: 10/14/2019 CLINICAL DATA:  Acute pain due to trauma. EXAM: CT HEAD WITHOUT CONTRAST CT CERVICAL SPINE WITHOUT CONTRAST TECHNIQUE: Multidetector CT imaging of the head and cervical spine was performed following the standard protocol without intravenous contrast. Multiplanar CT image reconstructions of the cervical spine were also generated. COMPARISON:  None. FINDINGS: CT HEAD FINDINGS Brain: No evidence of acute infarction, hemorrhage, hydrocephalus, extra-axial collection or mass lesion/mass effect. Vascular: No hyperdense vessel or unexpected calcification. Skull: There is left periorbital soft tissue swelling without evidence for an underlying fracture. Sinuses/Orbits: There is mucosal thickening of the ethmoid air cells and left sphenoid sinus. Other: None. CT CERVICAL SPINE FINDINGS Alignment: There is reversal of the normal cervical lordotic curvature. Skull base and vertebrae: The patient is status post prior posterior fusion from C3 through C4. Soft tissues and spinal canal: No prevertebral fluid or swelling. No visible canal hematoma. Disc levels: Mild-to-moderate multilevel disc height loss is noted throughout the cervical spine. Upper chest: Negative. Other: Small thyroid nodules are noted measuring  up to approximately  1.2 cm. No followup recommended (ref: J Am Coll Radiol. 2015 Feb;12(2): 143-50). IMPRESSION: 1. No CT evidence of acute intracranial process. 2. Left periorbital soft tissue swelling without evidence for underlying fracture. 3. No acute cervical spine fracture. 4. Mild to moderate multilevel degenerative disc disease of the cervical spine. Electronically Signed   By: Katherine Mantle M.D.   On: 10/14/2019 22:07   CT Cervical Spine Wo Contrast  Result Date: 10/14/2019 CLINICAL DATA:  Acute pain due to trauma. EXAM: CT HEAD WITHOUT CONTRAST CT CERVICAL SPINE WITHOUT CONTRAST TECHNIQUE: Multidetector CT imaging of the head and cervical spine was performed following the standard protocol without intravenous contrast. Multiplanar CT image reconstructions of the cervical spine were also generated. COMPARISON:  None. FINDINGS: CT HEAD FINDINGS Brain: No evidence of acute infarction, hemorrhage, hydrocephalus, extra-axial collection or mass lesion/mass effect. Vascular: No hyperdense vessel or unexpected calcification. Skull: There is left periorbital soft tissue swelling without evidence for an underlying fracture. Sinuses/Orbits: There is mucosal thickening of the ethmoid air cells and left sphenoid sinus. Other: None. CT CERVICAL SPINE FINDINGS Alignment: There is reversal of the normal cervical lordotic curvature. Skull base and vertebrae: The patient is status post prior posterior fusion from C3 through C4. Soft tissues and spinal canal: No prevertebral fluid or swelling. No visible canal hematoma. Disc levels: Mild-to-moderate multilevel disc height loss is noted throughout the cervical spine. Upper chest: Negative. Other: Small thyroid nodules are noted measuring up to approximately 1.2 cm. No followup recommended (ref: J Am Coll Radiol. 2015 Feb;12(2): 143-50). IMPRESSION: 1. No CT evidence of acute intracranial process. 2. Left periorbital soft tissue swelling without evidence for underlying fracture. 3. No  acute cervical spine fracture. 4. Mild to moderate multilevel degenerative disc disease of the cervical spine. Electronically Signed   By: Katherine Mantle M.D.   On: 10/14/2019 22:07   DG Chest Port 1 View  Result Date: 10/14/2019 CLINICAL DATA:  Encephalopathy EXAM: PORTABLE CHEST 1 VIEW COMPARISON:  09/12/2015 FINDINGS: The heart size and mediastinal contours are within normal limits. Both lungs are clear. The visualized skeletal structures are unremarkable. IMPRESSION: No active disease. Electronically Signed   By: Deatra Robinson M.D.   On: 10/14/2019 22:20   DG Hand Complete Left  Result Date: 10/14/2019 CLINICAL DATA:  Left hand and wrist pain after fall. Bruising. Fall at home. EXAM: LEFT HAND - COMPLETE 3+ VIEW COMPARISON:  None. FINDINGS: Mildly displaced fracture fifth digit base of the proximal phalanx at the metacarpal phalangeal joint. No other acute fracture of the hand. Alignment and joint spaces are preserved. There is generalized soft tissue edema. IMPRESSION: Mildly displaced fracture base of the fifth digit proximal phalanx at the metacarpophalangeal joint. Electronically Signed   By: Narda Rutherford M.D.   On: 10/14/2019 22:21    Procedures .Marland KitchenLaceration Repair  Date/Time: 10/14/2019 10:40 PM Performed by: Terrilee Files, MD Authorized by: Terrilee Files, MD   Consent:    Consent obtained:  Verbal   Consent given by:  Patient   Risks discussed:  Infection, pain, poor cosmetic result, poor wound healing and retained foreign body   Alternatives discussed:  No treatment and delayed treatment Anesthesia (see MAR for exact dosages):    Anesthesia method:  Local infiltration   Local anesthetic:  Lidocaine 2% WITH epi Laceration details:    Location:  Face   Face location:  L eyebrow   Length (cm):  3 Repair type:    Repair type:  Simple Pre-procedure details:    Preparation:  Patient was prepped and draped in usual sterile fashion Exploration:    Contaminated: no     Treatment:    Area cleansed with:  Saline Skin repair:    Repair method:  Sutures   Suture size:  5-0   Suture material:  Prolene   Suture technique:  Simple interrupted   Number of sutures:  3 Approximation:    Approximation:  Close Post-procedure details:    Dressing:  Non-adherent dressing   Patient tolerance of procedure:  Tolerated well, no immediate complications   (including critical care time)  Medications Ordered in ED Medications  lidocaine-EPINEPHrine (XYLOCAINE W/EPI) 2 %-1:200000 (PF) injection 10 mL (10 mLs Infiltration Given 10/14/19 2243)  Tdap (BOOSTRIX) injection 0.5 mL (0.5 mLs Intramuscular Given 10/14/19 2242)    ED Course  I have reviewed the triage vital signs and the nursing notes.  Pertinent labs & imaging results that were available during my care of the patient were reviewed by me and considered in my medical decision making (see chart for details).  Clinical Course as of Oct 14 913  Wynelle Link Oct 14, 2019  2224 X-rays of left hand and wrist show a fracture at the base of the fifth proximal phalanx base.  No other fractures or dislocation identified on my interpretation.  Chest x-ray interpreted by me as no pneumothorax no gross infiltrates.   [MB]  2225 Head and cervical spine CT do not show any acute findings other than some soft tissue swelling.   [MB]  2252 On reevaluation patient is much more coherent and states that her daughter is can be very mad at her for drinking and falling and causing such a mess.   [MB]  2315 Patient was put in an ulnar gutter by the nurse after applying some dressing to her hand abrasions.  Normal CSM's after application.   [MB]    Clinical Course User Index [MB] Terrilee Files, MD   MDM Rules/Calculators/A&P                     This patient complains of fall head laceration and intoxication; this involves an extensive number of treatment Options and is a complaint that carries with it a high risk of complications  and Morbidity. The differential includes skull fracture, head bleed, cervical fracture, metabolic derangement, intoxication complicating above  I ordered, reviewed and interpreted labs, which included normal hemoglobin and white count normal renal function.  Slightly low bicarb likely volume depletion.  Alcohol elevated at 116 consistent with her mental status I ordered medication tetanus update I ordered imaging studies which included CT head and cervical spine, left hand and wrist x-ray and I independently    visualized and interpreted imaging which showed no acute findings of head and cervical spine imaging.  On plain film x-rays of her left hand and wrist she has a fracture of the fifth proximal phalanx base nondisplaced Previous records obtained and reviewed in epic  After the interventions stated above, I reevaluated the patient and found more appropriate and less tearful.  She now states that she was arguing with daughter and became upset and started drinking.  She is calling for a ride so she can go home.   Final Clinical Impression(s) / ED Diagnoses Final diagnoses:  Fall, initial encounter  Facial laceration, initial encounter  Closed nondisplaced fracture of proximal phalanx of left little finger, initial encounter  Alcoholic intoxication without complication (HCC)  Rx / DC Orders ED Discharge Orders    None       Terrilee FilesButler, Sydny Schnitzler C, MD 10/15/19 [phone]

## 2019-10-14 NOTE — ED Triage Notes (Signed)
Pt arrived to er after falling at home, pt does not remember the fall, admits to etoh use that started after lunch, cbg 135. Pt tearful upon arrival to er, c/o laceration to head, pain to eft hand, neck area.

## 2019-10-17 DIAGNOSIS — F3132 Bipolar disorder, current episode depressed, moderate: Secondary | ICD-10-CM | POA: Insufficient documentation

## 2020-05-20 ENCOUNTER — Emergency Department (HOSPITAL_COMMUNITY)
Admission: EM | Admit: 2020-05-20 | Discharge: 2020-05-20 | Disposition: A | Discharge status: Home / Self Care | Payer: Medicare Other | Attending: Emergency Medicine | Admitting: Emergency Medicine

## 2020-05-20 ENCOUNTER — Emergency Department (HOSPITAL_COMMUNITY): Payer: Medicare Other

## 2020-05-20 ENCOUNTER — Encounter (HOSPITAL_COMMUNITY): Payer: Self-pay | Admitting: Emergency Medicine

## 2020-05-20 ENCOUNTER — Other Ambulatory Visit: Payer: Self-pay

## 2020-05-20 DIAGNOSIS — Z794 Long term (current) use of insulin: Secondary | ICD-10-CM | POA: Insufficient documentation

## 2020-05-20 DIAGNOSIS — E785 Hyperlipidemia, unspecified: Secondary | ICD-10-CM | POA: Insufficient documentation

## 2020-05-20 DIAGNOSIS — W19XXXA Unspecified fall, initial encounter: Secondary | ICD-10-CM

## 2020-05-20 DIAGNOSIS — E1169 Type 2 diabetes mellitus with other specified complication: Secondary | ICD-10-CM | POA: Insufficient documentation

## 2020-05-20 DIAGNOSIS — Z79899 Other long term (current) drug therapy: Secondary | ICD-10-CM | POA: Diagnosis not present

## 2020-05-20 DIAGNOSIS — Z87891 Personal history of nicotine dependence: Secondary | ICD-10-CM | POA: Insufficient documentation

## 2020-05-20 DIAGNOSIS — F10929 Alcohol use, unspecified with intoxication, unspecified: Secondary | ICD-10-CM | POA: Insufficient documentation

## 2020-05-20 DIAGNOSIS — J45909 Unspecified asthma, uncomplicated: Secondary | ICD-10-CM | POA: Insufficient documentation

## 2020-05-20 DIAGNOSIS — E1140 Type 2 diabetes mellitus with diabetic neuropathy, unspecified: Secondary | ICD-10-CM | POA: Diagnosis not present

## 2020-05-20 DIAGNOSIS — Z7951 Long term (current) use of inhaled steroids: Secondary | ICD-10-CM | POA: Diagnosis not present

## 2020-05-20 DIAGNOSIS — I10 Essential (primary) hypertension: Secondary | ICD-10-CM | POA: Insufficient documentation

## 2020-05-20 DIAGNOSIS — R270 Ataxia, unspecified: Secondary | ICD-10-CM | POA: Diagnosis not present

## 2020-05-20 DIAGNOSIS — J449 Chronic obstructive pulmonary disease, unspecified: Secondary | ICD-10-CM | POA: Diagnosis not present

## 2020-05-20 DIAGNOSIS — Z7984 Long term (current) use of oral hypoglycemic drugs: Secondary | ICD-10-CM | POA: Diagnosis not present

## 2020-05-20 DIAGNOSIS — F10920 Alcohol use, unspecified with intoxication, uncomplicated: Secondary | ICD-10-CM

## 2020-05-20 NOTE — ED Triage Notes (Signed)
Pt arrives via [facility] for fall after drinking atleast a pint of liquor. Family called EMS after pt fell in bathroom. Denies hitting her head.

## 2020-05-20 NOTE — Discharge Instructions (Addendum)
Follow up with your md as needed °

## 2020-05-20 NOTE — ED Provider Notes (Signed)
[facility] EMERGENCY DEPARTMENT Provider Note   CSN: [id number] Arrival date & time: 05/20/20  2147     History Chief Complaint  Patient presents with  . Alcohol Intoxication  . Fall    Lindsey Melton is a 69 y.o. female.  Patient has been drinking and had a fall. She has no complaints  The history is provided by the patient and medical records.  Alcohol Intoxication This is a recurrent problem. The current episode started 6 to 12 hours ago. The problem occurs constantly. The problem has not changed since onset.Pertinent negatives include no chest pain, no abdominal pain and no headaches. Nothing aggravates the symptoms. Nothing relieves the symptoms. She has tried nothing for the symptoms.  Fall Pertinent negatives include no chest pain, no abdominal pain and no headaches.       Past Medical History:  Diagnosis Date  . Anxiety   . Arthritis   . Asthma   . Cataract   . Cervical myelopathy (HCC)   . COPD (chronic obstructive pulmonary disease) (HCC)   . Depression   . Diabetes mellitus   . Headache   . History of hiatal hernia   . Hyperlipidemia   . Hypertension   . Kidney stones   . Neuromuscular disorder (HCC)   . Peripheral vertigo   . Substance abuse (HCC)   . Wears glasses     Patient Active Problem List   Diagnosis Date Noted  . Major depressive disorder 02/26/2016  . Cocaine abuse in remission (HCC) 02/26/2016  . Alcoholism in remission (HCC) 02/26/2016  . Healthcare maintenance 12/26/2015  . Cervical myelopathy with cervical radiculopathy (HCC) 09/16/2015  . Benign essential HTN   . DM type 2 with diabetic peripheral neuropathy (HCC)   . Chronic obstructive pulmonary disease (HCC)   . Post-operative pain   . Acute blood loss anemia   . Dysphagia   . Myelopathy (HCC) 09/10/2015  . S/P cervical spinal fusion   . Surgery, elective   . Postprocedural hypotension   . Spondylosis, cervical, with myelopathy 08/24/2015  . Polysubstance abuse (HCC)  08/24/2015  . Cold extremities 08/08/2015  . Leg pain 08/08/2015  . Muscle weakness 07/29/2015  . Neck pain 07/25/2015  . Hyponatremia 07/25/2015    Past Surgical History:  Procedure Laterality Date  . ABDOMINAL HYSTERECTOMY    . ANTERIOR CERVICAL DECOMP/DISCECTOMY FUSION N/A 09/10/2015   Procedure: ANTERIOR C3-4 DECOMPRESSION AND FUSION;  Surgeon: Venita Lickahari Brooks, MD;  Location: [facility] OR;  Service: Orthopedics;  Laterality: N/A;  . APPENDECTOMY    . POSTERIOR CERVICAL FUSION/FORAMINOTOMY N/A 09/10/2015   Procedure: POSTERIOR CERVICAL FUSION/FORAMINOTOMY Cervical 3-4;  Surgeon: Venita Lickahari Brooks, MD;  Location: [facility] OR;  Service: Orthopedics;  Laterality: N/A;     OB History   No obstetric history on file.     Family History  Problem Relation Age of Onset  . Arthritis Mother   . Diabetes Mother   . Hearing loss Mother   . Heart disease Mother   . Hypertension Mother   . Vision loss Mother   . Stroke Mother   . Asthma Mother   . Heart disease Father   . Depression Daughter   . Mental illness Daughter   . Vision loss Daughter   . Anxiety disorder Daughter   . Alcohol abuse Son   . Drug abuse Son   . Anxiety disorder Son     Social History   Tobacco Use  . Smoking status: Former Smoker    Types:  Cigarettes  . Smokeless tobacco: Never Used  Vaping Use  . Vaping Use: Never used  Substance Use Topics  . Alcohol use: Yes    Comment: last drank " a couple of beers" in December 2016  . Drug use: Not Currently    Types: "Crack" cocaine    Comment: last used crack cocaine in December 2016    Home Medications Prior to Admission medications   Medication Sig Start Date End Date Taking? Authorizing Provider  atorvastatin (LIPITOR) 10 MG tablet TAKE ONE TABLET BY MOUTH ONCE DAILY. 08/13/16   Elenora Gamma, MD  brimonidine (ALPHAGAN) 0.2 % ophthalmic solution Place 1 drop into the right eye 3 (three) times daily. 09/24/19   [provider]  dorzolamide-timolol (COSOPT)  22.3-6.8 MG/ML ophthalmic solution Place 1 drop into both eyes 2 (two) times daily. 09/14/19   [provider]  fluticasone-salmeterol (ADVAIR HFA) 115-21 MCG/ACT inhaler Inhale 2 puffs into the lungs 2 (two) times daily. 02/26/16   Elenora Gamma, MD  insulin glargine (LANTUS) 100 UNIT/ML injection Inject 15 Units into the skin at bedtime.     [provider]  lamoTRIgine (LAMICTAL) 100 MG tablet Take 100 mg by mouth 2 (two) times daily. 09/17/19   [provider]  latanoprost (XALATAN) 0.005 % ophthalmic solution Place 1 drop into both eyes at bedtime. 09/21/19   [provider]  liraglutide 18 MG/3ML SOPN Inject 0.2 mLs (1.2 mg total) into the skin daily. Start with 0.1 mLs (0.6 mg) daily for 1 week, then increase to 0.2 mLs daily Patient taking differently: Inject 1.2 mg into the skin daily.  05/06/16   Elenora Gamma, MD  lisinopril (PRINIVIL,ZESTRIL) 20 MG tablet Take 1 tablet (20 mg total) by mouth daily. 05/06/16   Elenora Gamma, MD  meloxicam (MOBIC) 7.5 MG tablet Take 7.5 mg by mouth 2 (two) times daily. 10/11/19   [provider]  omeprazole (PRILOSEC) 40 MG capsule Take 40 mg by mouth 2 (two) times daily. 09/28/19   [provider]  risperiDONE (RISPERDAL) 1 MG tablet Take 1 tablet by mouth at bedtime. 09/12/19   [provider]  topiramate (TOPAMAX) 100 MG tablet Take 1 tablet (100 mg total) by mouth 2 (two) times daily. 07/28/15   Elenora Gamma, MD  venlafaxine (EFFEXOR) 75 MG tablet Take 75 mg by mouth daily.     [provider]  VENTOLIN HFA 108 (90 Base) MCG/ACT inhaler INHALE 2 PUFFS INTO THE LUNGS EVERY 4 HOURS AS NEEDED. Patient taking differently: Inhale 2 puffs into the lungs every 4 (four) hours as needed for wheezing.  07/13/16   Elenora Gamma, MD    Allergies    Prozac [fluoxetine hcl] and Gabapentin  Review of Systems   Review of Systems  Constitutional: Negative for appetite change and  fatigue.  HENT: Negative for congestion, ear discharge and sinus pressure.   Eyes: Negative for discharge.  Respiratory: Negative for cough.   Cardiovascular: Negative for chest pain.  Gastrointestinal: Negative for abdominal pain and diarrhea.  Genitourinary: Negative for frequency and hematuria.  Musculoskeletal: Negative for back pain.  Skin: Negative for rash.  Neurological: Negative for seizures and headaches.  Psychiatric/Behavioral: Negative for hallucinations.    Physical Exam Updated Vital Signs BP (!) 159/98   Pulse 91   Temp 97.8 F (36.6 C) (Oral)   Resp 18   Ht 5\' 1"  (1.549 m)   Wt 74.8 kg   SpO2 94%   BMI  31.18 kg/m   Physical Exam Vitals and nursing note reviewed.  Constitutional:      Appearance: She is well-developed.  HENT:     Head: Normocephalic.     Nose: Nose normal.  Eyes:     General: No scleral icterus.    Conjunctiva/sclera: Conjunctivae normal.  Neck:     Thyroid: No thyromegaly.  Cardiovascular:     Rate and Rhythm: Normal rate and regular rhythm.     Heart sounds: No murmur heard.  No friction rub. No gallop.   Pulmonary:     Breath sounds: No stridor. No wheezing or rales.  Chest:     Chest wall: No tenderness.  Abdominal:     General: There is no distension.     Tenderness: There is no abdominal tenderness. There is no rebound.  Musculoskeletal:        General: Normal range of motion.     Cervical back: Neck supple.  Lymphadenopathy:     Cervical: No cervical adenopathy.  Skin:    Findings: No erythema or rash.  Neurological:     Mental Status: She is alert and oriented to person, place, and time.     Motor: No abnormal muscle tone.     Coordination: Coordination normal.     Comments: Patient mildly ataxic  Psychiatric:        Behavior: Behavior normal.     ED Results / Procedures / Treatments   Labs (all labs ordered are listed, but only abnormal results are displayed) Labs Reviewed - No data to  display  EKG None  Radiology CT Head Wo Contrast  Result Date: 05/20/2020 CLINICAL DATA:  69 year old female with head trauma. EXAM: CT HEAD WITHOUT CONTRAST CT CERVICAL SPINE WITHOUT CONTRAST TECHNIQUE: Multidetector CT imaging of the head and cervical spine was performed following the standard protocol without intravenous contrast. Multiplanar CT image reconstructions of the cervical spine were also generated. COMPARISON:  Head CT dated 10/14/2019. FINDINGS: CT HEAD FINDINGS Brain: The ventricles and sulci appropriate size for patient's age. The gray-white matter discrimination is preserved. There is no acute intracranial hemorrhage. No mass effect or midline shift. No extra-axial fluid collection. Vascular: No hyperdense vessel or unexpected calcification. Skull: Normal. Negative for fracture or focal lesion. Sinuses/Orbits: There is mild diffuse mucoperiosteal thickening of paranasal sinuses. No air-fluid level. The mastoid air cells are clear. Other: None CT CERVICAL SPINE FINDINGS Alignment: No acute subluxation. There is reversal of normal cervical lordosis which may be positional or due to muscle spasm. Skull base and vertebrae: No acute fracture. C3-C4 disc spacer and left posterior fusion. There is C4 laminectomy. Soft tissues and spinal canal: No prevertebral fluid or swelling. No visible canal hematoma. Disc levels: Multilevel degenerative changes. Grade 1 C4-C5 anterolisthesis. Upper chest: Negative. Other: Bilateral thyroid hypodense nodules measuring up to 7 mm on the right. Bilateral carotid bulb calcified plaques. IMPRESSION: 1. No acute intracranial pathology. 2. No acute/traumatic cervical spine pathology. 3. C3-C4 disc spacer and left posterior fusion. Electronically Signed   By: Elgie Collard M.D.   On: 05/20/2020 22:45   CT Cervical Spine Wo Contrast  Result Date: 05/20/2020 CLINICAL DATA:  69 year old female with head trauma. EXAM: CT HEAD WITHOUT CONTRAST CT CERVICAL SPINE  WITHOUT CONTRAST TECHNIQUE: Multidetector CT imaging of the head and cervical spine was performed following the standard protocol without intravenous contrast. Multiplanar CT image reconstructions of the cervical spine were also generated. COMPARISON:  Head CT dated 10/14/2019. FINDINGS: CT HEAD FINDINGS Brain: The  ventricles and sulci appropriate size for patient's age. The gray-white matter discrimination is preserved. There is no acute intracranial hemorrhage. No mass effect or midline shift. No extra-axial fluid collection. Vascular: No hyperdense vessel or unexpected calcification. Skull: Normal. Negative for fracture or focal lesion. Sinuses/Orbits: There is mild diffuse mucoperiosteal thickening of paranasal sinuses. No air-fluid level. The mastoid air cells are clear. Other: None CT CERVICAL SPINE FINDINGS Alignment: No acute subluxation. There is reversal of normal cervical lordosis which may be positional or due to muscle spasm. Skull base and vertebrae: No acute fracture. C3-C4 disc spacer and left posterior fusion. There is C4 laminectomy. Soft tissues and spinal canal: No prevertebral fluid or swelling. No visible canal hematoma. Disc levels: Multilevel degenerative changes. Grade 1 C4-C5 anterolisthesis. Upper chest: Negative. Other: Bilateral thyroid hypodense nodules measuring up to 7 mm on the right. Bilateral carotid bulb calcified plaques. IMPRESSION: 1. No acute intracranial pathology. 2. No acute/traumatic cervical spine pathology. 3. C3-C4 disc spacer and left posterior fusion. Electronically Signed   By: Elgie Collard M.D.   On: 05/20/2020 22:45   DG Pelvis Portable  Result Date: 05/20/2020 CLINICAL DATA:  Larey Seat EXAM: PORTABLE PELVIS 1-2 VIEWS COMPARISON:  None. FINDINGS: Supine frontal view of the pelvis was performed. No acute displaced fracture. Hips are anatomic Lea aligned. Sacroiliac joints are normal. Soft tissues are grossly unremarkable. IMPRESSION: 1. No acute pelvic fracture.  Electronically Signed   By: Sharlet Salina M.D.   On: 05/20/2020 22:30    Procedures Procedures (including critical care time)  Medications Ordered in ED Medications - No data to display  ED Course  I have reviewed the triage vital signs and the nursing notes.  Pertinent labs & imaging results that were available during my care of the patient were reviewed by me and considered in my medical decision making (see chart for details).    MDM Rules/Calculators/A&P                          EtOH abuse and fall with no injury.  Patient will follow up as needed     This patient presents to the ED for concern of fall, this involves an extensive number of treatment options, and is a complaint that carries with it a high risk of complications and morbidity.  The differential diagnosis includes head injury   Lab Tests:     Medicines ordered:     Imaging Studies ordered:   I ordered imaging studies which included CT head CT cervical spine and pelvis x-ray  I independently visualized and interpreted imaging which showed all negative  Additional history obtained:   Additional history obtained from relative  Previous records obtained and reviewed.  Consultations Obtained:     Reevaluation:  After the interventions stated above, I reevaluated the patient and found mildly improved  Critical Interventions:  .   Final Clinical Impression(s) / ED Diagnoses Final diagnoses:  Fall, initial encounter  Alcoholic intoxication without complication [facility])    Rx / DC Orders ED Discharge Orders    None       Bethann Berkshire, MD 05/21/20 1234

## 2020-05-20 NOTE — ED Notes (Addendum)
Pt unable to sign ; pt wheeled out to car... husband driving. Pt in passenger seat. Pt unsteady but ambulatory.

## 2020-09-30 ENCOUNTER — Other Ambulatory Visit: Payer: Self-pay

## 2020-09-30 ENCOUNTER — Emergency Department (HOSPITAL_COMMUNITY): Payer: Medicare Other

## 2020-09-30 ENCOUNTER — Encounter (HOSPITAL_COMMUNITY): Payer: Self-pay | Admitting: Emergency Medicine

## 2020-09-30 DIAGNOSIS — J449 Chronic obstructive pulmonary disease, unspecified: Secondary | ICD-10-CM | POA: Insufficient documentation

## 2020-09-30 DIAGNOSIS — Z7952 Long term (current) use of systemic steroids: Secondary | ICD-10-CM | POA: Insufficient documentation

## 2020-09-30 DIAGNOSIS — J45909 Unspecified asthma, uncomplicated: Secondary | ICD-10-CM | POA: Insufficient documentation

## 2020-09-30 DIAGNOSIS — W19XXXA Unspecified fall, initial encounter: Secondary | ICD-10-CM | POA: Diagnosis not present

## 2020-09-30 DIAGNOSIS — S82851A Displaced trimalleolar fracture of right lower leg, initial encounter for closed fracture: Secondary | ICD-10-CM | POA: Diagnosis not present

## 2020-09-30 DIAGNOSIS — Z794 Long term (current) use of insulin: Secondary | ICD-10-CM | POA: Diagnosis not present

## 2020-09-30 DIAGNOSIS — E1140 Type 2 diabetes mellitus with diabetic neuropathy, unspecified: Secondary | ICD-10-CM | POA: Insufficient documentation

## 2020-09-30 DIAGNOSIS — Z79899 Other long term (current) drug therapy: Secondary | ICD-10-CM | POA: Insufficient documentation

## 2020-09-30 DIAGNOSIS — I10 Essential (primary) hypertension: Secondary | ICD-10-CM | POA: Insufficient documentation

## 2020-09-30 DIAGNOSIS — S99911A Unspecified injury of right ankle, initial encounter: Secondary | ICD-10-CM | POA: Diagnosis present

## 2020-09-30 DIAGNOSIS — Z87891 Personal history of nicotine dependence: Secondary | ICD-10-CM | POA: Insufficient documentation

## 2020-09-30 DIAGNOSIS — R52 Pain, unspecified: Secondary | ICD-10-CM

## 2020-09-30 DIAGNOSIS — Y92009 Unspecified place in unspecified non-institutional (private) residence as the place of occurrence of the external cause: Secondary | ICD-10-CM | POA: Diagnosis not present

## 2020-09-30 NOTE — ED Triage Notes (Addendum)
Pt to the ED [facility] reporting right ankle pain after a fall at home.  EMS reports obvious deformity with ETOH on board.   Pedal pulse palpated and intact.  Pt reports drinking liquor half a fifth of liquor with her last drink being at noon.

## 2020-10-01 ENCOUNTER — Emergency Department (HOSPITAL_COMMUNITY): Payer: Medicare Other

## 2020-10-01 ENCOUNTER — Ambulatory Visit: Payer: Medicare Other | Admitting: Orthopedic Surgery

## 2020-10-01 ENCOUNTER — Emergency Department (HOSPITAL_COMMUNITY)
Admission: EM | Admit: 2020-10-01 | Discharge: 2020-10-01 | Disposition: A | Discharge status: Home / Self Care | Payer: Medicare Other | Attending: Emergency Medicine | Admitting: Emergency Medicine

## 2020-10-01 ENCOUNTER — Telehealth: Payer: Self-pay | Admitting: Orthopedic Surgery

## 2020-10-01 DIAGNOSIS — R52 Pain, unspecified: Secondary | ICD-10-CM

## 2020-10-01 DIAGNOSIS — S82851A Displaced trimalleolar fracture of right lower leg, initial encounter for closed fracture: Secondary | ICD-10-CM

## 2020-10-01 MED ORDER — OXYCODONE-ACETAMINOPHEN 5-325 MG PO TABS
1.0000 | ORAL_TABLET | Freq: Once | ORAL | Status: AC
Start: 2020-10-01 — End: 2020-10-01
  Administered 2020-10-01: 1 via ORAL
  Filled 2020-10-01: qty 1

## 2020-10-01 MED ORDER — FENTANYL CITRATE (PF) 100 MCG/2ML IJ SOLN
100.0000 ug | Freq: Once | INTRAMUSCULAR | Status: AC
Start: 2020-10-01 — End: 2020-10-01
  Administered 2020-10-01: 100 ug via INTRAVENOUS
  Filled 2020-10-01: qty 2

## 2020-10-01 MED ORDER — OXYCODONE-ACETAMINOPHEN 5-325 MG PO TABS: 1.0000 | ORAL_TABLET | ORAL | 0 refills | Status: DC | PRN

## 2020-10-01 NOTE — ED Provider Notes (Signed)
[facility] EMERGENCY DEPARTMENT Provider Note   CSN: [id number] Arrival date & time: 09/30/20  2114   History Chief Complaint  Patient presents with  . Ankle Pain    Lindsey Melton is a 70 y.o. female.  The history is provided by the patient.  Ankle Pain She has history of hypertension, diabetes, hyperlipidemia, COPD and comes in after injuring her right ankle.  She was giving herself some insulin when she apparently lost her balance and fell injuring her right ankle.  She denies other injury.  She came in by ambulance.  She does admit to having been drinking earlier in the day.  Past Medical History:  Diagnosis Date  . Anxiety   . Arthritis   . Asthma   . Cataract   . Cervical myelopathy (HCC)   . COPD (chronic obstructive pulmonary disease) (HCC)   . Depression   . Diabetes mellitus   . Headache   . History of hiatal hernia   . Hyperlipidemia   . Hypertension   . Kidney stones   . Neuromuscular disorder (HCC)   . Peripheral vertigo   . Substance abuse (HCC)   . Wears glasses     Patient Active Problem List   Diagnosis Date Noted  . Major depressive disorder 02/26/2016  . Cocaine abuse in remission (HCC) 02/26/2016  . Alcoholism in remission (HCC) 02/26/2016  . Healthcare maintenance 12/26/2015  . Cervical myelopathy with cervical radiculopathy (HCC) 09/16/2015  . Benign essential HTN   . DM type 2 with diabetic peripheral neuropathy (HCC)   . Chronic obstructive pulmonary disease (HCC)   . Post-operative pain   . Acute blood loss anemia   . Dysphagia   . Myelopathy (HCC) 09/10/2015  . S/P cervical spinal fusion   . Surgery, elective   . Postprocedural hypotension   . Spondylosis, cervical, with myelopathy 08/24/2015  . Polysubstance abuse (HCC) 08/24/2015  . Cold extremities 08/08/2015  . Leg pain 08/08/2015  . Muscle weakness 07/29/2015  . Neck pain 07/25/2015  . Hyponatremia 07/25/2015    Past Surgical History:  Procedure Laterality Date  .  ABDOMINAL HYSTERECTOMY    . ANTERIOR CERVICAL DECOMP/DISCECTOMY FUSION N/A 09/10/2015   Procedure: ANTERIOR C3-4 DECOMPRESSION AND FUSION;  Surgeon: Venita Lick, MD;  Location: [facility] OR;  Service: Orthopedics;  Laterality: N/A;  . APPENDECTOMY    . POSTERIOR CERVICAL FUSION/FORAMINOTOMY N/A 09/10/2015   Procedure: POSTERIOR CERVICAL FUSION/FORAMINOTOMY Cervical 3-4;  Surgeon: Venita Lick, MD;  Location: [facility] OR;  Service: Orthopedics;  Laterality: N/A;     OB History   No obstetric history on file.     Family History  Problem Relation Age of Onset  . Arthritis Mother   . Diabetes Mother   . Hearing loss Mother   . Heart disease Mother   . Hypertension Mother   . Vision loss Mother   . Stroke Mother   . Asthma Mother   . Heart disease Father   . Depression Daughter   . Mental illness Daughter   . Vision loss Daughter   . Anxiety disorder Daughter   . Alcohol abuse Son   . Drug abuse Son   . Anxiety disorder Son     Social History   Tobacco Use  . Smoking status: Former Smoker    Types: Cigarettes  . Smokeless tobacco: Never Used  Vaping Use  . Vaping Use: Never used  Substance Use Topics  . Alcohol use: Yes    Alcohol/week: 10.0 standard drinks  Types: 10 Shots of liquor per week    Comment: last drank 09/30/20  . Drug use: Not Currently    Types: "Crack" cocaine    Comment: last used crack cocaine in December 2016    Home Medications Prior to Admission medications   Medication Sig Start Date End Date Taking? Authorizing Provider  atorvastatin (LIPITOR) 10 MG tablet TAKE ONE TABLET BY MOUTH ONCE DAILY. 08/13/16   Elenora Gamma, MD  brimonidine (ALPHAGAN) 0.2 % ophthalmic solution Place 1 drop into the right eye 3 (three) times daily. 09/24/19   [provider]  dorzolamide-timolol (COSOPT) 22.3-6.8 MG/ML ophthalmic solution Place 1 drop into both eyes 2 (two) times daily. 09/14/19   [provider]  fluticasone-salmeterol (ADVAIR HFA) 115-21  MCG/ACT inhaler Inhale 2 puffs into the lungs 2 (two) times daily. 02/26/16   Elenora Gamma, MD  insulin glargine (LANTUS) 100 UNIT/ML injection Inject 15 Units into the skin at bedtime.     [provider]  lamoTRIgine (LAMICTAL) 100 MG tablet Take 100 mg by mouth 2 (two) times daily. 09/17/19   [provider]  latanoprost (XALATAN) 0.005 % ophthalmic solution Place 1 drop into both eyes at bedtime. 09/21/19   [provider]  liraglutide 18 MG/3ML SOPN Inject 0.2 mLs (1.2 mg total) into the skin daily. Start with 0.1 mLs (0.6 mg) daily for 1 week, then increase to 0.2 mLs daily Patient taking differently: Inject 1.2 mg into the skin daily.  05/06/16   Elenora Gamma, MD  lisinopril (PRINIVIL,ZESTRIL) 20 MG tablet Take 1 tablet (20 mg total) by mouth daily. 05/06/16   Elenora Gamma, MD  meloxicam (MOBIC) 7.5 MG tablet Take 7.5 mg by mouth 2 (two) times daily. 10/11/19   [provider]  omeprazole (PRILOSEC) 40 MG capsule Take 40 mg by mouth 2 (two) times daily. 09/28/19   [provider]  risperiDONE (RISPERDAL) 1 MG tablet Take 1 tablet by mouth at bedtime. 09/12/19   [provider]  topiramate (TOPAMAX) 100 MG tablet Take 1 tablet (100 mg total) by mouth 2 (two) times daily. 07/28/15   Elenora Gamma, MD  venlafaxine (EFFEXOR) 75 MG tablet Take 75 mg by mouth daily.     [provider]  VENTOLIN HFA 108 (90 Base) MCG/ACT inhaler INHALE 2 PUFFS INTO THE LUNGS EVERY 4 HOURS AS NEEDED. Patient taking differently: Inhale 2 puffs into the lungs every 4 (four) hours as needed for wheezing.  07/13/16   Elenora Gamma, MD    Allergies    Fluoxetine hcl, Prozac [fluoxetine hcl], and Gabapentin  Review of Systems   Review of Systems  All other systems reviewed and are negative.   Physical Exam Updated Vital Signs BP (!) 173/95   Pulse 79   Resp 14   Ht 5\' 1"  (1.549 m)   Wt 72.6 kg   SpO2 94%   BMI 30.23 kg/m    Physical Exam Vitals and nursing note reviewed.   70 year old female, resting comfortably and in no acute distress. Vital signs are significant for elevated blood pressure. Oxygen saturation is 94%, which is normal. Head is normocephalic and atraumatic. PERRLA, EOMI. Oropharynx is clear. Neck is nontender and supple without adenopathy or JVD. Back is nontender and there is no CVA tenderness. Lungs are clear without rales, wheezes, or rhonchi. Chest is nontender. Heart has regular rate and rhythm without murmur. Abdomen is soft, flat, nontender without masses or hepatosplenomegaly and peristalsis is  normoactive. Extremities: Obvious swelling and deformity of the right ankle.  Dorsalis pedis pulse is 2+.  Capillary refill is prompt.  There is normal sensation.  There is no break in the skin.  No other extremity injury seen. Skin is warm and dry without rash. Neurologic: Mental status is normal, cranial nerves are intact, there are no motor or sensory deficits.  ED Results / Procedures / Treatments    Radiology DG Ankle 2 Views Right  Result Date: 10/01/2020 CLINICAL DATA:  Post reduction EXAM: RIGHT ANKLE - 2 VIEW COMPARISON:  Radiographs 09/30/2020 FINDINGS: Patient is post reduction and splinting of a previously seen ankle fracture dislocation. There is minimal residual lateral displacement across what is likely a trans syndesmotic distal fibular fracture. Some slight residual medial displacement across the transversely oriented medial malleolar fracture and minimal superior displacement remains at the vertically oriented posterior malleolar fracture. There is slight residual lateral talar shift and tilt. Overall, alignment has been significantly improved following reduction. No new acute osseous injuries are identified. Plantar calcaneal spurring IMPRESSION: Marked interval improvement in alignment following reduction and splinting of a previously seen ankle fracture dislocation. Minimal  residual displacements as detailed above. Electronically Signed   By: Kreg ShropshirePrice  DeHay M.D.   On: 10/01/2020 02:03   DG Ankle Right Port  Result Date: 09/30/2020 CLINICAL DATA:  Fall, ankle pain, deformity EXAM: PORTABLE RIGHT ANKLE - 2 VIEW COMPARISON:  None. FINDINGS: Trimalleolar fracture dislocation noted at the right ankle. Fracture through the distal fibular metaphysis, medial malleolus and posterior malleolus. Fracture fragments also noted at the lateral malleolus. The talus is posteriorly and laterally dislocated relative to the tibia. IMPRESSION: Right ankle trimalleolar fracture dislocation. Electronically Signed   By: Charlett NoseKevin  Dover M.D.   On: 09/30/2020 23:30    Procedures .Ortho Injury Treatment  Date/Time: 10/01/2020 1:40 AM Performed by: Dione BoozeGlick, Petrea Fredenburg, MD Authorized by: Dione BoozeGlick, Tevion Laforge, MD   Consent:    Consent obtained:  Verbal   Consent given by:  Patient   Procedural risks discussed: pain.   Alternatives discussed:  No treatmentInjury location: ankle Location details: right ankle Injury type: fracture-dislocation Fracture type: trimalleolar Pre-procedure neurovascular assessment: neurovascularly intact Pre-procedure distal perfusion: normal Pre-procedure neurological function: normal  Anesthesia: Local anesthesia used: no  Patient sedated: NoManipulation performed: yes Skin traction used: no Skeletal traction used: no Reduction successful: yes X-ray confirmed reduction: yes Immobilization: splint Splint type: ankle stirrup (plus posterior short leg) Splint Applied by: ED Provider Supplies used: cotton padding,  Ortho-Glass and elastic bandage Post-procedure neurovascular assessment: post-procedure neurovascularly intact Post-procedure distal perfusion: normal Post-procedure neurological function: normal Post-procedure range of motion comment: N/A      Medications Ordered in ED Medications  oxyCODONE-acetaminophen (PERCOCET/ROXICET) 5-325 MG per tablet 1 tablet  (has no administration in time range)  fentaNYL (SUBLIMAZE) injection 100 mcg (100 mcg Intravenous Given 10/01/20 0105)    ED Course  I have reviewed the triage vital signs and the nursing notes.  Pertinent imaging results that were available during my care of the patient were reviewed by me and considered in my medical decision making (see chart for details).  MDM Rules/Calculators/A&P Right ankle injury.  X-rays show trimalleolar fracture dislocation.  She will be given fentanyl and fracture dislocation reduced and splint applied.  Old records were reviewed, and she has other ED visits for falls following alcohol ingestion.  Postreduction x-ray shows fairly good alignment.  Case is discussed with Dr. Romeo AppleHarrison, on-call for orthopedics, who request the patient follow-up in his office  at 8:30 AM.  She is given prescription for oxycodone-acetaminophen to use as needed for pain.  Advised on ice and elevation and given crutches to use.  Final Clinical Impression(s) / ED Diagnoses Final diagnoses:  Pain  Traumatic closed displaced trimalleolar fracture of right ankle, initial encounter    Rx / DC Orders ED Discharge Orders         Ordered    oxyCODONE-acetaminophen (PERCOCET) 5-325 MG tablet  Every 4 hours PRN        10/01/20 0222    oxyCODONE-acetaminophen (PERCOCET) 5-325 MG tablet  Every 4 hours PRN        10/01/20 0222           Dione Booze, MD 10/01/20 [phone]

## 2020-10-01 NOTE — Telephone Encounter (Signed)
Done. Patient aware of the rescheduled appointment (for tomorrow, 10/02/20, 11:40am)

## 2020-10-01 NOTE — Discharge Instructions (Addendum)
Keep ear foot elevated.  Apply ice as much as possible.  Follow-up with the orthopedic physician.  He will need to set you up for surgery to fix the broken bones in your ankle.

## 2020-10-01 NOTE — Telephone Encounter (Signed)
Patient daughter left a message that her mom would not be able to make the appointment for 10/01/2020. She wants to re-schedule for Thursday.   I have left a message for the patient to call back.

## 2020-10-01 NOTE — Telephone Encounter (Signed)
Ok thanks 

## 2020-10-01 NOTE — Telephone Encounter (Signed)
Voice message received from patient following [name] visit, relaying "I cannot come at 8:30 this morning as the Emergency room doctor said."  I returned call, left message on patient's voice mail with our morning clinic hours, and to please return call to discuss.

## 2020-10-02 ENCOUNTER — Ambulatory Visit: Payer: Medicare Other | Admitting: Orthopedic Surgery

## 2020-10-02 ENCOUNTER — Other Ambulatory Visit: Payer: Self-pay

## 2020-10-02 ENCOUNTER — Encounter: Payer: Self-pay | Admitting: Orthopedic Surgery

## 2020-10-02 VITALS — Ht 61.0 in

## 2020-10-02 DIAGNOSIS — W010XXA Fall on same level from slipping, tripping and stumbling without subsequent striking against object, initial encounter: Secondary | ICD-10-CM

## 2020-10-02 DIAGNOSIS — S82851A Displaced trimalleolar fracture of right lower leg, initial encounter for closed fracture: Secondary | ICD-10-CM

## 2020-10-02 MED FILL — Oxycodone w/ Acetaminophen Tab 5-325 MG: ORAL | Qty: 6 | Status: AC

## 2020-10-02 NOTE — Patient Instructions (Signed)
Lindsey Melton  10/02/2020     @PREFPERIOPPHARMACY @   Your procedure is scheduled on  10/07/2020   Report to 10/09/2020 at  0845  A.M.   Call this number if you have problems the morning of surgery:  [phone]   Remember:  Do not eat or drink after midnight.                        Take these medicines the morning of surgery with A SIP OF WATER  mobic (if needed) or oxycodone (if needed), prilosec, topamax, effexor.  Use your inhalers before you come and bring your rescue inhaler with you.  Take 7.5 units of lantus the night before your procedure. DO NOT take any medications for diabetes the morning of your procedure.  If your glucose is 70 or below the morning of your procedure, drink 1/2 cup of clear juice and recheck your glucose in 15 minutes. If your glucose is still 70 or below, call [phone] for instructions.  If your glucose is 300 or above the morning of your procedure, call [phone] for instructions.      Do not wear jewelry, make-up or nail polish.  Do not wear lotions, powders, or perfumes, or deodorant.  Do not shave 48 hours prior to surgery.  Men may shave face and neck.  Do not bring valuables to the hospital.  [facility] is not responsible for any belongings or valuables.   Contacts, dentures or bridgework may not be worn into surgery.  Leave your suitcase in the car.  After surgery it may be brought to your room.  For patients admitted to the hospital, discharge time will be determined by your treatment team.  Patients discharged the day of surgery will not be allowed to drive home and must have someone with them for 24 hours.  Place clean sheets on your bed the night before your procedure. DO NOT sleep with  Pets this night.  Shower with CHG the night before and the morning of your procedure. DO NOT put CHG on your face, hair or genitals.  After each shower, dry off with a clean towel, put on clean, comfortable clothes and brush  your teeth.    Special instructions:  DO NOT smoke tobacco or vape the morning of your procedure.    Please read over the following fact sheets that you were given. Coughing and Deep Breathing, Surgical Site Infection Prevention, Anesthesia Post-op Instructions and Care and Recovery After Surgery       Displaced Bimalleolar Ankle Fracture Treated With ORIF, Care After This sheet gives you information about how to care for yourself after your procedure. Your health care provider may also give you more specific instructions. If you have problems or questions, contact your health care provider. What can I expect after the procedure? After the procedure, it is common to have:  Pain.  Swelling.  A small amount of fluid from your incision. Follow these instructions at home: Medicines  Take over-the-counter and prescription medicines only as told by your health care provider.  Ask your health care provider if the medicine prescribed to you: ? Requires you to avoid driving or using machinery. ? Can cause constipation. You may need to take these actions to prevent or treat constipation:  Drink enough fluid to keep your urine pale yellow.  Take over-the-counter or prescription medicines.  Eat foods that are high in fiber, such as beans,  whole grains, and fresh fruits and vegetables.  Limit foods that are high in fat and processed sugars, such as fried or sweet foods. If you have a splint or boot:  Wear the splint or boot as told by your health care provider. Remove it only as told by your health care provider.  Loosen the splint or boot if your toes tingle, become numb, or turn cold and blue.  Keep the splint or boot clean. If you have a cast:  Do not stick anything inside the cast to scratch your skin. Doing that increases your risk of infection.  Check the skin around the cast every day. Tell your health care provider about any concerns.  You may put lotion on dry skin  around the edges of the cast. Do not put lotion on the skin underneath the cast.  Keep the cast clean. Bathing  Do not take baths, swim, or use a hot tub until your health care provider approves. Ask your health care provider if you may take showers. You may only be allowed to take sponge baths.  If your splint, boot, or cast is not waterproof: ? Do not let it get wet. ? Cover it with a watertight covering when you take a bath or a shower.  Keep the bandage (dressing) dry until your health care provider says it can be removed. Incision care  Follow instructions from your health care provider about how to take care of your incision. Make sure you: ? Wash your hands with soap and water for at least 20 seconds before and after you change your dressing. If soap and water are not available, use hand sanitizer. ? Change your dressing as told by your health care provider. ? Leave stitches (sutures), skin glue, or adhesive strips in place. These skin closures may need to stay in place for 2 weeks or longer. If adhesive strip edges start to loosen and curl up, you may trim the loose edges. Do not remove adhesive strips completely unless your health care provider tells you to do that.  Check your incision area every day for signs of infection. Check for: ? Redness. ? More pain or more swelling. ? Blood or more fluid. ? Warmth. ? Pus or a bad smell.   Managing pain, stiffness, and swelling  If directed, put ice on the affected area. To do this: ? If you have a removable splint or boot, remove it as told by your health care provider. ? Put ice in a plastic bag. ? Place a towel between your skin and the bag, or between your cast and the bag. ? Leave the ice on for 20 minutes, 2-3 times a day. ? Remove the ice if your skin turns bright red. This is very important. If you cannot feel pain, heat, or cold, you have a greater risk of damage to the area.  Move your toes often to reduce stiffness and  swelling.  Raise (elevate) the injured area above the level of your heart while you are sitting or lying down. To do this, try putting a few pillows under your leg and ankle.   Activity  Do not use your injured limb to support (bear) your body weight until your health care provider says that you can. Follow weight-bearing restrictions as told. Use crutches or other assistive devicesto help you move around as told by your health care provider.  Ask your health care provider when it is safe to drive if you have a splint, boot,  or cast on your foot.  Do exercises as told by your health care provider or physical therapist.  Return to your normal activities as told by your health care provider. Ask your health care provider what activities are safe for you. General instructions  Do not put pressure on any part of the splint or cast until it is fully hardened, if applicable. This may take several hours.  Do not use any products that contain nicotine or tobacco, such as cigarettes, e-cigarettes, and chewing tobacco. These can delay bone healing. If you need help quitting, ask your health care provider.  Keep all follow-up visits. This is important. Contact a health care provider if:  You have a fever.  Your pain medicine is not helping.  You have redness around your incision.  You have more swelling or severe pain around your incision.  You have more fluid or blood coming from your incision or leaking through your cast.  Your incision feels warm to the touch.  You have pus or a bad smell coming from your incision or from your cast or dressing. Get help right away if:  The edges of your incision come apart after the stitches or staples have been taken out.  You have chest pain.  You have trouble breathing.  Your foot or leg feels numb or tingles.  Your foot becomes cold, pale, or blue.  You have calf swelling or tenderness. These symptoms may represent a serious problem that is  an emergency. Do not wait to see if the symptoms will go away. Get medical help right away. Call your local emergency services (911 in the [place] Do not drive yourself to the hospital. Summary  After the procedure, it is common to have pain and swelling.  If your splint, boot, or cast is not waterproof, do not let it get wet.  Contact your health care provider if you have more swelling or severe pain, or if you have more fluids coming from your incision or leaking through your cast.  Get help right away if you have numbness or tingling in your foot or leg, or if your foot becomes cold, pale, or blue. This information is not intended to replace advice given to you by your health care provider. Make sure you discuss any questions you have with your health care provider. Document Revised: 10/01/2019 Document Reviewed: 10/01/2019 Elsevier Patient Education  2021 [employer] General Anesthesia, Adult, Care After This sheet gives you information about how to care for yourself after your procedure. Your health care provider may also give you more specific instructions. If you have problems or questions, contact your health care provider. What can I expect after the procedure? After the procedure, the following side effects are common:  Pain or discomfort at the IV site.  Nausea.  Vomiting.  Sore throat.  Trouble concentrating.  Feeling cold or chills.  Feeling weak or tired.  Sleepiness and fatigue.  Soreness and body aches. These side effects can affect parts of the body that were not involved in surgery. Follow these instructions at home: For the time period you were told by your health care provider:  Rest.  Do not participate in activities where you could fall or become injured.  Do not drive or use machinery.  Do not drink alcohol.  Do not take sleeping pills or medicines that cause drowsiness.  Do not make important decisions or sign legal documents.  Do not take  care of children on your own.  Eating and drinking  Follow any instructions from your health care provider about eating or drinking restrictions.  When you feel hungry, start by eating small amounts of foods that are soft and easy to digest (bland), such as toast. Gradually return to your regular diet.  Drink enough fluid to keep your urine pale yellow.  If you vomit, rehydrate by drinking water, juice, or clear broth. General instructions  If you have sleep apnea, surgery and certain medicines can increase your risk for breathing problems. Follow instructions from your health care provider about wearing your sleep device: ? Anytime you are sleeping, including during daytime naps. ? While taking prescription pain medicines, sleeping medicines, or medicines that make you drowsy.  Have a responsible adult stay with you for the time you are told. It is important to have someone help care for you until you are awake and alert.  Return to your normal activities as told by your health care provider. Ask your health care provider what activities are safe for you.  Take over-the-counter and prescription medicines only as told by your health care provider.  If you smoke, do not smoke without supervision.  Keep all follow-up visits as told by your health care provider. This is important. Contact a health care provider if:  You have nausea or vomiting that does not get better with medicine.  You cannot eat or drink without vomiting.  You have pain that does not get better with medicine.  You are unable to pass urine.  You develop a skin rash.  You have a fever.  You have redness around your IV site that gets worse. Get help right away if:  You have difficulty breathing.  You have chest pain.  You have blood in your urine or stool, or you vomit blood. Summary  After the procedure, it is common to have a sore throat or nausea. It is also common to feel tired.  Have a  responsible adult stay with you for the time you are told. It is important to have someone help care for you until you are awake and alert.  When you feel hungry, start by eating small amounts of foods that are soft and easy to digest (bland), such as toast. Gradually return to your regular diet.  Drink enough fluid to keep your urine pale yellow.  Return to your normal activities as told by your health care provider. Ask your health care provider what activities are safe for you. This information is not intended to replace advice given to you by your health care provider. Make sure you discuss any questions you have with your health care provider. Document Revised: 03/13/2020 Document Reviewed: 10/11/2019 Elsevier Patient Education  2021 [employer].

## 2020-10-02 NOTE — Patient Instructions (Addendum)
preop appointment tomorrow am [name] main [facility] at 8:30 Surgery will be Tuesday when you go for preop they will tell you the arrival times and instructions    Displaced Trimalleolar Ankle Fracture Treated With ORIF A trimalleolar fracture involves three breaks (fractures) in the lower leg bones that help to form the ankle. These fractures are in:  The bottom end of the fibula. This is the bone that feels like a bump on the outer side of the ankle.  The bottom end of the tibia. This is the bone that feels like a bump on the inner side of the ankle. Open reduction with internal fixation (ORIF) is a surgical procedure that may be used to treat a trimalleolar fracture. You may need this surgery if your fractures are displaced, which means that the bones are broken and not in their normal positions. During this procedure, a surgeon will move the bones back into their normal positions. Then, the surgeon will put in a combination of screws, metal plates, or different types of wiring to hold the bones in place. Tell a health care provider about:  Any allergies you have.  All medicines you are taking, including vitamins, herbs, eye drops, creams, and over-the-counter medicines.  Any problems you or family members have had with anesthetic medicines.  Any blood disorders you have.  Any surgeries you have had.  Any medical conditions you have.  Whether you are pregnant or may be pregnant. What are the risks? Generally, this is a safe procedure. However, problems may occur, including:  Excessive bleeding.  Infection.  Allergic reactions to medicines.  Damage to nerves or blood vessels.  Failure of the fracture to heal.  Stiffness in the ankle, long-term joint pain, or joint disease (arthritis).  Blood clots. What happens before the procedure? Staying hydrated Follow instructions from your health care provider about hydration, which may include:  Up to 2 hours before the  procedure - you may continue to drink clear liquids, such as water, clear fruit juice, black coffee, and plain tea.   Eating and drinking restrictions Follow instructions from your health care provider about eating and drinking, which may include:  8 hours before the procedure - stop eating heavy meals or foods, such as meat, fried foods, or fatty foods.  6 hours before the procedure - stop eating light meals or foods, such as toast or cereal.  6 hours before the procedure - stop drinking milk or drinks that contain milk.  2 hours before the procedure - stop drinking clear liquids. Medicines Ask your health care provider about:  Changing or stopping your regular medicines. This is especially important if you are taking diabetes medicines or blood thinners.  Taking medicines such as aspirin and ibuprofen. These medicines can thin your blood. Do not take these medicines unless your health care provider tells you to take them.  Taking over-the-counter medicines, vitamins, herbs, and supplements. General instructions  Do not use any products that contain nicotine or tobacco for at least 4 weeks before the procedure. These products include cigarettes, e-cigarettes, and chewing tobacco. If you need help quitting, ask your health care provider.  Plan to have a responsible adult take you home from the hospital or clinic.  If you will be going home right after the procedure, plan to have a responsible adult care for you at home for the time you are told. This is important.  Ask your health care provider: ? How your surgery site will be marked. ?  What steps will be taken to help prevent infection. These steps may include:  Removing hair at the surgery site.  Washing skin with a germ-killing soap.  Taking antibiotic medicine. What happens during the procedure?  An IV will be inserted into one of your veins. You may be given antibiotic medicine and pain medicine through the IV.  You will  be given one or more of the following: ? A medicine to help you relax (sedative). ? A medicine to numb the area (local anesthetic). ? A medicine to make you fall asleep (general anesthetic). ? A medicine that is injected into your spine to numb the area below and slightly above the injection site (spinal anesthetic). ? A medicine that is injected into an area of your body to numb everything below the injection site (regional anesthetic).  The surgeon will make an incision through your skin over the area of the fractures.  The broken bones will be put back into their normal positions. The surgeon will use a combination of screws, metal plates, or different types of wiring to hold the bones in place.  After the bones are back in place, the surgeon will close your incision using stitches (sutures) or staples.  You will have a bandage, or dressing, and a splint, cast, or supportive boot placed over your ankle. The procedure may vary among health care providers and hospitals. What happens after the procedure?  Your blood pressure, heart rate, breathing rate, and blood oxygen level will be monitored until you leave the hospital or clinic.  You will be given medicine for pain as needed.  You may be given instructions about weight-bearing restrictions. These instructions tell you how much body weight you can or cannot safely support (bear) on your injured foot. You may be given crutches, a cane, or a walker to help you move around so that you do not bear any weight on your foot.  While in the hospital, you will be helped out of bed so you can begin moving around. This is important to improve blood flow and breathing.  If you were given a sedative during the procedure, it can affect you for several hours. Do not drive or operate machinery until your health care provider says that it is safe. Summary  A trimalleolar fracture involves three breaks, also called fractures, in the lower leg bones  (fibula and tibia) that help to form the ankle.  If you will be going home right after the procedure, plan to have a responsible adult care for you at home for the time you are told. This is important.  During your procedure, the surgeon will move the bones back into their normal positions and use screws, metal plates, or wires to hold the bones in place.  After your incision is closed, a splint, cast, or supportive boot will be placed over your ankle. This information is not intended to replace advice given to you by your health care provider. Make sure you discuss any questions you have with your health care provider. Document Revised: 10/29/2019 Document Reviewed: 10/29/2019 Elsevier Patient Education  2021 [employer].

## 2020-10-02 NOTE — Progress Notes (Signed)
NEW PROBLEM//OFFICE VISIT  Summary assessment and plan:   The procedure has been fully reviewed with the patient; The risks and benefits of surgery have been discussed and explained and understood. Alternative treatment has also been reviewed, questions were encouraged and answered. The postoperative plan is also been reviewed.  ORIF right ankle high risk patient secondary to diabetes  Chief Complaint  Patient presents with  . Foot Injury    Right foot, patient reports doing some better,     70 year old female diabetic with hypertension seems to have some type of alcohol history slipped and fell injured her right ankle had a closed reduction in the ER on 22 March  She missed her first appointment secondary to transportation issue presents now with a displaced but reduced right ankle fracture      Review of Systems  All other systems reviewed and are negative.  She reported anxiety chest pain occasionally shortness of breath occasionally back pain denies fever chills numbness or tingling primary care doctor is Dr. Drue Stager Pennie Rushing  Past Medical History:  Diagnosis Date  . Anxiety   . Arthritis   . Asthma   . Cataract   . Cervical myelopathy (HCC)   . COPD (chronic obstructive pulmonary disease) (HCC)   . Depression   . Diabetes mellitus   . Headache   . History of hiatal hernia   . Hyperlipidemia   . Hypertension   . Kidney stones   . Neuromuscular disorder (HCC)   . Peripheral vertigo   . Substance abuse (HCC)   . Wears glasses     Past Surgical History:  Procedure Laterality Date  . ABDOMINAL HYSTERECTOMY    . ANTERIOR CERVICAL DECOMP/DISCECTOMY FUSION N/A 09/10/2015   Procedure: ANTERIOR C3-4 DECOMPRESSION AND FUSION;  Surgeon: Venita Lick, MD;  Location: [facility] OR;  Service: Orthopedics;  Laterality: N/A;  . APPENDECTOMY    . POSTERIOR CERVICAL FUSION/FORAMINOTOMY N/A 09/10/2015   Procedure: POSTERIOR CERVICAL FUSION/FORAMINOTOMY Cervical 3-4;   Surgeon: Venita Lick, MD;  Location: [facility] OR;  Service: Orthopedics;  Laterality: N/A;    Family History  Problem Relation Age of Onset  . Arthritis Mother   . Diabetes Mother   . Hearing loss Mother   . Heart disease Mother   . Hypertension Mother   . Vision loss Mother   . Stroke Mother   . Asthma Mother   . Heart disease Father   . Depression Daughter   . Mental illness Daughter   . Vision loss Daughter   . Anxiety disorder Daughter   . Alcohol abuse Son   . Drug abuse Son   . Anxiety disorder Son    Social History   Tobacco Use  . Smoking status: Former Smoker    Types: Cigarettes  . Smokeless tobacco: Never Used  Vaping Use  . Vaping Use: Never used  Substance Use Topics  . Alcohol use: Yes    Alcohol/week: 10.0 standard drinks    Types: 10 Shots of liquor per week    Comment: last drank 09/30/20  . Drug use: Not Currently    Types: "Crack" cocaine    Comment: last used crack cocaine in December 2016    Allergies  Allergen Reactions  . Fluoxetine Hcl Itching and Other (See Comments)    REACTION: Jaundice--shut down liver function REACTION: Jaundice--shut down liver function REACTION: Jaundice--shut down liver function REACTION: Jaundice--shut down liver function REACTION: Jaundice--shut down liver function REACTION: Jaundice--shut down liver function REACTION: Jaundice--shut down liver function   .  Prozac [Fluoxetine Hcl] Itching and Other (See Comments)    REACTION: Jaundice--shut down liver function  . Gabapentin Other (See Comments)    Other reaction(s): Dizziness, Other (See Comments) dizziness     Current Meds  Medication Sig  . atorvastatin (LIPITOR) 10 MG tablet TAKE ONE TABLET BY MOUTH ONCE DAILY.  . brimonidine (ALPHAGAN) 0.2 % ophthalmic solution Place 1 drop into the right eye 3 (three) times daily.  . dorzolamide-timolol (COSOPT) 22.3-6.8 MG/ML ophthalmic solution Place 1 drop into both eyes 2 (two) times daily.  .  fluticasone-salmeterol (ADVAIR HFA) 115-21 MCG/ACT inhaler Inhale 2 puffs into the lungs 2 (two) times daily.  . insulin glargine (LANTUS) 100 UNIT/ML injection Inject 15 Units into the skin at bedtime.   . lamoTRIgine (LAMICTAL) 100 MG tablet Take 100 mg by mouth 2 (two) times daily.  Marland Kitchen latanoprost (XALATAN) 0.005 % ophthalmic solution Place 1 drop into both eyes at bedtime.  . liraglutide 18 MG/3ML SOPN Inject 0.2 mLs (1.2 mg total) into the skin daily. Start with 0.1 mLs (0.6 mg) daily for 1 week, then increase to 0.2 mLs daily (Patient taking differently: Inject 1.2 mg into the skin daily.)  . lisinopril (PRINIVIL,ZESTRIL) 20 MG tablet Take 1 tablet (20 mg total) by mouth daily.  . meloxicam (MOBIC) 7.5 MG tablet Take 7.5 mg by mouth 2 (two) times daily.  Marland Kitchen omeprazole (PRILOSEC) 40 MG capsule Take 40 mg by mouth 2 (two) times daily.  Marland Kitchen oxyCODONE-acetaminophen (PERCOCET) 5-325 MG tablet Take 1 tablet by mouth every 4 (four) hours as needed for moderate pain.  Marland Kitchen oxyCODONE-acetaminophen (PERCOCET) 5-325 MG tablet Take 1 tablet by mouth every 4 (four) hours as needed for moderate pain.  Marland Kitchen risperiDONE (RISPERDAL) 1 MG tablet Take 1 tablet by mouth at bedtime.  . topiramate (TOPAMAX) 100 MG tablet Take 1 tablet (100 mg total) by mouth 2 (two) times daily.  Marland Kitchen venlafaxine (EFFEXOR) 75 MG tablet Take 75 mg by mouth daily.   . VENTOLIN HFA 108 (90 Base) MCG/ACT inhaler INHALE 2 PUFFS INTO THE LUNGS EVERY 4 HOURS AS NEEDED. (Patient taking differently: Inhale 2 puffs into the lungs every 4 (four) hours as needed for wheezing.)    Ht 5\' 1"  (1.549 m)   BMI 30.23 kg/m   Physical Exam  General appearance: Well-developed well-nourished no gross deformities  Cardiovascular normal pulse and perfusion normal color without edema  Neurologically o sensation loss or deficits or pathologic reflexes  Psychological: Awake alert and oriented x3 mood and affect normal  Skin no lacerations or ulcerations no  nodularity no palpable masses, no erythema or nodularity  Musculoskeletal:  Splint surrounds the fracture toes and capillary refill sensation is normal   MEDICAL DECISION MAKING  A.  Encounter Diagnosis  Name Primary?  . Closed trimalleolar fracture of right ankle, initial encounter Yes    B. DATA ANALYSED:   IMAGING: Interpretation of images: External images show a prereduction x-ray showing a trimalleolar fracture with a small posterior malleolar fragment postreduction x-ray shows fracture reduction improved alignment  Orders: Surgery ORIF right ankle  Outside records reviewed: ER records confirm patient's given history   C. MANAGEMENT   ORIF right ankle  No orders of the defined types were placed in this encounter.     , MD  10/02/2020 12:02 PM

## 2020-10-03 ENCOUNTER — Encounter (HOSPITAL_COMMUNITY): Payer: Self-pay

## 2020-10-03 ENCOUNTER — Other Ambulatory Visit (HOSPITAL_COMMUNITY)
Admission: RE | Admit: 2020-10-03 | Discharge: 2020-10-03 | Disposition: A | Discharge status: Home / Self Care | Payer: Medicare Other | Source: Ambulatory Visit | Attending: Orthopedic Surgery | Admitting: Orthopedic Surgery

## 2020-10-03 ENCOUNTER — Encounter (HOSPITAL_COMMUNITY)
Admission: RE | Admit: 2020-10-03 | Discharge: 2020-10-03 | Disposition: A | Discharge status: Home / Self Care | Payer: Medicare Other | Source: Ambulatory Visit | Attending: Orthopedic Surgery | Admitting: Orthopedic Surgery

## 2020-10-03 DIAGNOSIS — Z20822 Contact with and (suspected) exposure to covid-19: Secondary | ICD-10-CM | POA: Insufficient documentation

## 2020-10-03 DIAGNOSIS — Z01818 Encounter for other preprocedural examination: Secondary | ICD-10-CM | POA: Diagnosis present

## 2020-10-03 LAB — BASIC METABOLIC PANEL
Anion gap: 13 (ref 5–15)
BUN: 13 mg/dL (ref 8–23)
CO2: 23 mmol/L (ref 22–32)
Calcium: 9.2 mg/dL (ref 8.9–10.3)
Chloride: 102 mmol/L (ref 98–111)
Creatinine, Ser: 0.78 mg/dL (ref 0.44–1.00)
GFR, Estimated: >60 mL/min (ref >60)
Glucose, Bld: 162 mg/dL — ABNORMAL HIGH (ref 70–99)
Potassium: 3.7 mmol/L (ref 3.5–5.1)
Sodium: 138 mmol/L (ref 135–145)

## 2020-10-03 LAB — CBC WITH DIFFERENTIAL/PLATELET
Abs Immature Granulocytes: 0.03 10*3/uL (ref 0.00–0.07)
Basophils Absolute: 0 10*3/uL (ref 0.0–0.1)
Basophils Relative: 1 %
Eosinophils Absolute: 0.2 10*3/uL (ref 0.0–0.5)
Eosinophils Relative: 3 %
HCT: 41.6 % (ref 36.0–46.0)
Hemoglobin: 13.4 g/dL (ref 12.0–15.0)
Immature Granulocytes: 1 %
Lymphocytes Relative: 16 %
Lymphs Abs: 1 10*3/uL (ref 0.7–4.0)
MCH: 30 pg (ref 26.0–34.0)
MCHC: 32.2 g/dL (ref 30.0–36.0)
MCV: 93.3 fL (ref 80.0–100.0)
Monocytes Absolute: 0.6 10*3/uL (ref 0.1–1.0)
Monocytes Relative: 9 %
Neutro Abs: 4.2 10*3/uL (ref 1.7–7.7)
Neutrophils Relative %: 70 %
Platelets: 311 10*3/uL (ref 150–400)
RBC: 4.46 MIL/uL (ref 3.87–5.11)
RDW: 11.9 % (ref 11.5–15.5)
WBC: 5.9 10*3/uL (ref 4.0–10.5)
nRBC: 0 % (ref 0.0–0.2)

## 2020-10-03 LAB — HEMOGLOBIN A1C
Hgb A1c MFr Bld: 8.1 % — ABNORMAL HIGH (ref 4.8–5.6)
Mean Plasma Glucose: 185.77 mg/dL

## 2020-10-03 LAB — SARS CORONAVIRUS 2 (TAT 6-24 HRS): SARS Coronavirus 2: NEGATIVE

## 2020-10-06 NOTE — Pre-Procedure Instructions (Signed)
Hgba1c routed to PCP. 

## 2020-10-06 NOTE — H&P (Signed)
Summary assessment and plan:    The procedure has been fully reviewed with the patient; The risks and benefits of surgery have been discussed and explained and understood. Alternative treatment has also been reviewed, questions were encouraged and answered. The postoperative plan is also been reviewed.   ORIF right ankle high risk patient secondary to diabetes       Chief Complaint  Patient presents with  . Foot Injury      Right foot, patient reports doing some better,        70 year old female diabetic with hypertension seems to have some type of alcohol history slipped and fell injured her right ankle had a closed reduction in the ER on 22 March   She missed her first appointment secondary to transportation issue presents now with a displaced but reduced right ankle fracture           Review of Systems  All other systems reviewed and are negative.   She reported anxiety chest pain occasionally shortness of breath occasionally back pain denies fever chills numbness or tingling primary care doctor is Dr. Drue Stager Pennie Rushing       Past Medical History:  Diagnosis Date  . Anxiety    . Arthritis    . Asthma    . Cataract    . Cervical myelopathy (HCC)    . COPD (chronic obstructive pulmonary disease) (HCC)    . Depression    . Diabetes mellitus    . Headache    . History of hiatal hernia    . Hyperlipidemia    . Hypertension    . Kidney stones    . Neuromuscular disorder (HCC)    . Peripheral vertigo    . Substance abuse (HCC)    . Wears glasses             Past Surgical History:  Procedure Laterality Date  . ABDOMINAL HYSTERECTOMY      . ANTERIOR CERVICAL DECOMP/DISCECTOMY FUSION N/A 09/10/2015    Procedure: ANTERIOR C3-4 DECOMPRESSION AND FUSION;  Surgeon: Venita Lick, MD;  Location: [facility] OR;  Service: Orthopedics;  Laterality: N/A;  . APPENDECTOMY      . POSTERIOR CERVICAL FUSION/FORAMINOTOMY N/A 09/10/2015    Procedure: POSTERIOR CERVICAL  FUSION/FORAMINOTOMY Cervical 3-4;  Surgeon: Venita Lick, MD;  Location: [facility] OR;  Service: Orthopedics;  Laterality: N/A;           Family History  Problem Relation Age of Onset  . Arthritis Mother    . Diabetes Mother    . Hearing loss Mother    . Heart disease Mother    . Hypertension Mother    . Vision loss Mother    . Stroke Mother    . Asthma Mother    . Heart disease Father    . Depression Daughter    . Mental illness Daughter    . Vision loss Daughter    . Anxiety disorder Daughter    . Alcohol abuse Son    . Drug abuse Son    . Anxiety disorder Son      Social History         Tobacco Use  . Smoking status: Former Smoker      Types: Cigarettes  . Smokeless tobacco: Never Used  Vaping Use  . Vaping Use: Never used  Substance Use Topics  . Alcohol use: Yes      Alcohol/week: 10.0 standard drinks      Types: 10 Shots of liquor  per week      Comment: last drank 09/30/20  . Drug use: Not Currently      Types: "Crack" cocaine      Comment: last used crack cocaine in December 2016           Allergies  Allergen Reactions  . Fluoxetine Hcl Itching and Other (See Comments)      REACTION: Jaundice--shut down liver function REACTION: Jaundice--shut down liver function REACTION: Jaundice--shut down liver function REACTION: Jaundice--shut down liver function REACTION: Jaundice--shut down liver function REACTION: Jaundice--shut down liver function REACTION: Jaundice--shut down liver function    . Prozac [Fluoxetine Hcl] Itching and Other (See Comments)      REACTION: Jaundice--shut down liver function  . Gabapentin Other (See Comments)      Other reaction(s): Dizziness, Other (See Comments) dizziness        Active Medications      Current Meds  Medication Sig  . atorvastatin (LIPITOR) 10 MG tablet TAKE ONE TABLET BY MOUTH ONCE DAILY.  . brimonidine (ALPHAGAN) 0.2 % ophthalmic solution Place 1 drop into the right eye 3 (three) times daily.  .  dorzolamide-timolol (COSOPT) 22.3-6.8 MG/ML ophthalmic solution Place 1 drop into both eyes 2 (two) times daily.  . fluticasone-salmeterol (ADVAIR HFA) 115-21 MCG/ACT inhaler Inhale 2 puffs into the lungs 2 (two) times daily.  . insulin glargine (LANTUS) 100 UNIT/ML injection Inject 15 Units into the skin at bedtime.   . lamoTRIgine (LAMICTAL) 100 MG tablet Take 100 mg by mouth 2 (two) times daily.  Marland Kitchen latanoprost (XALATAN) 0.005 % ophthalmic solution Place 1 drop into both eyes at bedtime.  . liraglutide 18 MG/3ML SOPN Inject 0.2 mLs (1.2 mg total) into the skin daily. Start with 0.1 mLs (0.6 mg) daily for 1 week, then increase to 0.2 mLs daily (Patient taking differently: Inject 1.2 mg into the skin daily.)  . lisinopril (PRINIVIL,ZESTRIL) 20 MG tablet Take 1 tablet (20 mg total) by mouth daily.  . meloxicam (MOBIC) 7.5 MG tablet Take 7.5 mg by mouth 2 (two) times daily.  Marland Kitchen omeprazole (PRILOSEC) 40 MG capsule Take 40 mg by mouth 2 (two) times daily.  Marland Kitchen oxyCODONE-acetaminophen (PERCOCET) 5-325 MG tablet Take 1 tablet by mouth every 4 (four) hours as needed for moderate pain.  Marland Kitchen oxyCODONE-acetaminophen (PERCOCET) 5-325 MG tablet Take 1 tablet by mouth every 4 (four) hours as needed for moderate pain.  Marland Kitchen risperiDONE (RISPERDAL) 1 MG tablet Take 1 tablet by mouth at bedtime.  . topiramate (TOPAMAX) 100 MG tablet Take 1 tablet (100 mg total) by mouth 2 (two) times daily.  Marland Kitchen venlafaxine (EFFEXOR) 75 MG tablet Take 75 mg by mouth daily.   . VENTOLIN HFA 108 (90 Base) MCG/ACT inhaler INHALE 2 PUFFS INTO THE LUNGS EVERY 4 HOURS AS NEEDED. (Patient taking differently: Inhale 2 puffs into the lungs every 4 (four) hours as needed for wheezing.)        Ht 5\' 1"  (1.549 m)   BMI 30.23 kg/m    Physical Exam   General appearance: Well-developed well-nourished no gross deformities  Cardiovascular normal pulse and perfusion normal color without edema  Neurologically o sensation loss or deficits or  pathologic reflexes   Psychological: Awake alert and oriented x3 mood and affect normal   Skin no lacerations or ulcerations no nodularity no palpable masses, no erythema or nodularity   Musculoskeletal:   Splint surrounds the fracture toes and capillary refill sensation is normal     MEDICAL DECISION MAKING  A.      Encounter Diagnosis  Name Primary?  . Closed trimalleolar fracture of right ankle, initial encounter Yes      B. DATA ANALYSED:   IMAGING: Interpretation of images: External images show a prereduction x-ray showing a trimalleolar fracture with a small posterior malleolar fragment postreduction x-ray shows fracture reduction improved alignment  Orders: Surgery ORIF right ankle  Outside records reviewed: ER records confirm patient's given history    C. MANAGEMENT    ORIF right ankle   No orders of the defined types were placed in this encounter.         Fuller Canada, MD

## 2020-10-07 ENCOUNTER — Encounter (HOSPITAL_COMMUNITY): Payer: Self-pay | Admitting: Orthopedic Surgery

## 2020-10-07 ENCOUNTER — Ambulatory Visit (HOSPITAL_COMMUNITY): Payer: Medicare Other

## 2020-10-07 ENCOUNTER — Other Ambulatory Visit: Payer: Self-pay

## 2020-10-07 ENCOUNTER — Ambulatory Visit (HOSPITAL_COMMUNITY)
Admission: RE | Admit: 2020-10-07 | Discharge: 2020-10-07 | Disposition: A | Discharge status: Home / Self Care | Payer: Medicare Other | Attending: Orthopedic Surgery | Admitting: Orthopedic Surgery

## 2020-10-07 ENCOUNTER — Ambulatory Visit (HOSPITAL_COMMUNITY): Payer: Medicare Other | Admitting: Anesthesiology

## 2020-10-07 ENCOUNTER — Encounter (HOSPITAL_COMMUNITY)
Admission: RE | Disposition: A | Discharge status: Home / Self Care | Payer: Self-pay | Source: Home / Self Care | Attending: Orthopedic Surgery

## 2020-10-07 DIAGNOSIS — S82891D Other fracture of right lower leg, subsequent encounter for closed fracture with routine healing: Secondary | ICD-10-CM

## 2020-10-07 DIAGNOSIS — E785 Hyperlipidemia, unspecified: Secondary | ICD-10-CM | POA: Diagnosis not present

## 2020-10-07 DIAGNOSIS — Z7951 Long term (current) use of inhaled steroids: Secondary | ICD-10-CM | POA: Diagnosis not present

## 2020-10-07 DIAGNOSIS — Z794 Long term (current) use of insulin: Secondary | ICD-10-CM | POA: Insufficient documentation

## 2020-10-07 DIAGNOSIS — W010XXA Fall on same level from slipping, tripping and stumbling without subsequent striking against object, initial encounter: Secondary | ICD-10-CM | POA: Insufficient documentation

## 2020-10-07 DIAGNOSIS — Z833 Family history of diabetes mellitus: Secondary | ICD-10-CM | POA: Diagnosis not present

## 2020-10-07 DIAGNOSIS — Z791 Long term (current) use of non-steroidal anti-inflammatories (NSAID): Secondary | ICD-10-CM | POA: Insufficient documentation

## 2020-10-07 DIAGNOSIS — Z888 Allergy status to other drugs, medicaments and biological substances status: Secondary | ICD-10-CM | POA: Insufficient documentation

## 2020-10-07 DIAGNOSIS — Z91041 Radiographic dye allergy status: Secondary | ICD-10-CM | POA: Insufficient documentation

## 2020-10-07 DIAGNOSIS — E1136 Type 2 diabetes mellitus with diabetic cataract: Secondary | ICD-10-CM | POA: Insufficient documentation

## 2020-10-07 DIAGNOSIS — Z79899 Other long term (current) drug therapy: Secondary | ICD-10-CM | POA: Insufficient documentation

## 2020-10-07 DIAGNOSIS — Z90710 Acquired absence of both cervix and uterus: Secondary | ICD-10-CM | POA: Insufficient documentation

## 2020-10-07 DIAGNOSIS — S82891A Other fracture of right lower leg, initial encounter for closed fracture: Secondary | ICD-10-CM

## 2020-10-07 DIAGNOSIS — Z8249 Family history of ischemic heart disease and other diseases of the circulatory system: Secondary | ICD-10-CM | POA: Insufficient documentation

## 2020-10-07 DIAGNOSIS — S82851A Displaced trimalleolar fracture of right lower leg, initial encounter for closed fracture: Secondary | ICD-10-CM | POA: Insufficient documentation

## 2020-10-07 DIAGNOSIS — Z87891 Personal history of nicotine dependence: Secondary | ICD-10-CM | POA: Insufficient documentation

## 2020-10-07 DIAGNOSIS — I10 Essential (primary) hypertension: Secondary | ICD-10-CM | POA: Insufficient documentation

## 2020-10-07 HISTORY — PX: ORIF ANKLE FRACTURE: SHX5408

## 2020-10-07 LAB — GLUCOSE, CAPILLARY
Glucose-Capillary: 133 mg/dL — ABNORMAL HIGH (ref 70–99)
Glucose-Capillary: 157 mg/dL — ABNORMAL HIGH (ref 70–99)

## 2020-10-07 SURGERY — OPEN REDUCTION INTERNAL FIXATION (ORIF) ANKLE FRACTURE
Anesthesia: General | Site: Ankle | Laterality: Right

## 2020-10-07 MED ORDER — CEFAZOLIN SODIUM-DEXTROSE 2-4 GM/100ML-% IV SOLN
2.0000 g | INTRAVENOUS | Status: AC
  Administered 2020-10-07: 2 g via INTRAVENOUS
  Filled 2020-10-07: qty 100

## 2020-10-07 MED ORDER — FENTANYL CITRATE (PF) 250 MCG/5ML IJ SOLN
INTRAMUSCULAR | Status: AC
  Filled 2020-10-07: qty 5

## 2020-10-07 MED ORDER — GLYCOPYRROLATE PF 0.2 MG/ML IJ SOSY
PREFILLED_SYRINGE | INTRAMUSCULAR | Status: AC
  Filled 2020-10-07: qty 2

## 2020-10-07 MED ORDER — LABETALOL HCL 5 MG/ML IV SOLN
INTRAVENOUS | Status: AC
  Filled 2020-10-07: qty 4

## 2020-10-07 MED ORDER — SUGAMMADEX SODIUM 500 MG/5ML IV SOLN
INTRAVENOUS | Status: AC
  Filled 2020-10-07: qty 5

## 2020-10-07 MED ORDER — MIDAZOLAM HCL 2 MG/2ML IJ SOLN
INTRAMUSCULAR | Status: AC
  Filled 2020-10-07: qty 2

## 2020-10-07 MED ORDER — PROPOFOL 10 MG/ML IV BOLUS
INTRAVENOUS | Status: DC | PRN
Start: 2020-10-07 — End: 2020-10-07
  Administered 2020-10-07: 180 mg via INTRAVENOUS

## 2020-10-07 MED ORDER — LABETALOL HCL 5 MG/ML IV SOLN
INTRAVENOUS | Status: DC | PRN
  Administered 2020-10-07 (×2): 5 mg via INTRAVENOUS

## 2020-10-07 MED ORDER — FENTANYL CITRATE (PF) 100 MCG/2ML IJ SOLN
INTRAMUSCULAR | Status: DC | PRN
  Administered 2020-10-07 (×7): 50 ug via INTRAVENOUS

## 2020-10-07 MED ORDER — ONDANSETRON HCL 4 MG/2ML IJ SOLN
INTRAMUSCULAR | Status: AC
  Filled 2020-10-07: qty 2

## 2020-10-07 MED ORDER — BUPIVACAINE HCL (PF) 0.5 % IJ SOLN
INTRAMUSCULAR | Status: AC
  Filled 2020-10-07: qty 30

## 2020-10-07 MED ORDER — IBUPROFEN 800 MG PO TABS
800.0000 mg | ORAL_TABLET | Freq: Once | ORAL | Status: AC
  Administered 2020-10-07: 800 mg via ORAL
  Filled 2020-10-07: qty 1

## 2020-10-07 MED ORDER — 0.9 % SODIUM CHLORIDE (POUR BTL) OPTIME
TOPICAL | Status: DC | PRN
  Administered 2020-10-07: 1000 mL

## 2020-10-07 MED ORDER — LIDOCAINE HCL (PF) 1 % IJ SOLN
INTRAMUSCULAR | Status: AC
  Filled 2020-10-07: qty 30

## 2020-10-07 MED ORDER — OXYCODONE-ACETAMINOPHEN 7.5-325 MG PO TABS
1.0000 | ORAL_TABLET | ORAL | 0 refills | Status: DC | PRN
Start: 2020-10-07 — End: 2020-10-21

## 2020-10-07 MED ORDER — IBUPROFEN 800 MG PO TABS: 800.0000 mg | ORAL_TABLET | Freq: Three times a day (TID) | ORAL | 1 refills | Status: DC | PRN

## 2020-10-07 MED ORDER — HYDROMORPHONE HCL 1 MG/ML IJ SOLN
0.2500 mg | INTRAMUSCULAR | Status: DC | PRN
  Administered 2020-10-07 (×4): 0.5 mg via INTRAVENOUS
  Filled 2020-10-07 (×4): qty 0.5

## 2020-10-07 MED ORDER — CHLORHEXIDINE GLUCONATE 0.12 % MT SOLN
15.0000 mL | Freq: Once | OROMUCOSAL | Status: AC
  Administered 2020-10-07: 15 mL via OROMUCOSAL

## 2020-10-07 MED ORDER — SUCCINYLCHOLINE CHLORIDE 200 MG/10ML IV SOSY
PREFILLED_SYRINGE | INTRAVENOUS | Status: AC
  Filled 2020-10-07: qty 20

## 2020-10-07 MED ORDER — DEXAMETHASONE SODIUM PHOSPHATE 4 MG/ML IJ SOLN
INTRAMUSCULAR | Status: AC
  Filled 2020-10-07: qty 2

## 2020-10-07 MED ORDER — ONDANSETRON HCL 4 MG/2ML IJ SOLN
INTRAMUSCULAR | Status: DC | PRN
  Administered 2020-10-07: 4 mg via INTRAVENOUS

## 2020-10-07 MED ORDER — MEPERIDINE HCL 50 MG/ML IJ SOLN: 6.2500 mg | INTRAMUSCULAR | Status: DC | PRN

## 2020-10-07 MED ORDER — FENTANYL CITRATE (PF) 100 MCG/2ML IJ SOLN: 50.0000 ug | Freq: Once | INTRAMUSCULAR | Status: DC

## 2020-10-07 MED ORDER — LACTATED RINGERS IV SOLN: INTRAVENOUS | Status: DC

## 2020-10-07 MED ORDER — METOCLOPRAMIDE HCL 5 MG/ML IJ SOLN
INTRAMUSCULAR | Status: AC
  Filled 2020-10-07: qty 2

## 2020-10-07 MED ORDER — MIDAZOLAM HCL 2 MG/2ML IJ SOLN: 2.0000 mg | Freq: Once | INTRAMUSCULAR | Status: DC

## 2020-10-07 MED ORDER — PROPOFOL 10 MG/ML IV BOLUS
INTRAVENOUS | Status: AC
  Filled 2020-10-07: qty 20

## 2020-10-07 MED ORDER — SUCCINYLCHOLINE 20MG/ML (10ML) SYRINGE FOR MEDFUSION PUMP - OPTIME
INTRAMUSCULAR | Status: DC | PRN
  Administered 2020-10-07: 100 mg via INTRAVENOUS

## 2020-10-07 MED ORDER — OXYCODONE-ACETAMINOPHEN 5-325 MG PO TABS
1.0000 | ORAL_TABLET | ORAL | Status: DC | PRN
  Administered 2020-10-07: 1 via ORAL
  Filled 2020-10-07: qty 1

## 2020-10-07 MED ORDER — BUPIVACAINE-EPINEPHRINE (PF) 0.25% -1:200000 IJ SOLN
INTRAMUSCULAR | Status: AC
  Filled 2020-10-07: qty 30

## 2020-10-07 MED ORDER — BUPIVACAINE-EPINEPHRINE (PF) 0.5% -1:200000 IJ SOLN
INTRAMUSCULAR | Status: DC | PRN
  Administered 2020-10-07: 30 mL via PERINEURAL

## 2020-10-07 MED ORDER — BUPIVACAINE-EPINEPHRINE (PF) 0.5% -1:200000 IJ SOLN
INTRAMUSCULAR | Status: AC
  Filled 2020-10-07: qty 60

## 2020-10-07 MED ORDER — ONDANSETRON HCL 4 MG/2ML IJ SOLN: 4.0000 mg | Freq: Once | INTRAMUSCULAR | Status: DC | PRN

## 2020-10-07 MED ORDER — ONDANSETRON HCL 4 MG/2ML IJ SOLN
4.0000 mg | Freq: Once | INTRAMUSCULAR | Status: AC
  Administered 2020-10-07: 4 mg via INTRAVENOUS
  Filled 2020-10-07: qty 2

## 2020-10-07 MED ORDER — ORAL CARE MOUTH RINSE: 15.0000 mL | Freq: Once | OROMUCOSAL | Status: AC

## 2020-10-07 MED ORDER — MIDAZOLAM HCL 5 MG/5ML IJ SOLN
INTRAMUSCULAR | Status: DC | PRN
  Administered 2020-10-07: 2 mg via INTRAVENOUS

## 2020-10-07 SURGICAL SUPPLY — 67 items
1.6 k wire ×4 IMPLANT
APL PRP STRL LF DISP 70% ISPRP (MISCELLANEOUS) ×2
BANDAGE ELASTIC 4 VELCRO NS (GAUZE/BANDAGES/DRESSINGS) ×2 IMPLANT
BANDAGE ESMARK 4X12 BL STRL LF (DISPOSABLE) ×1 IMPLANT
BIT DRILL 2.6 (BIT) ×2 IMPLANT
BLADE SURG SZ10 CARB STEEL (BLADE) ×2 IMPLANT
BNDG CMPR 12X4 ELC STRL LF (DISPOSABLE) ×1
BNDG CMPR STD VLCR NS LF 5.8X4 (GAUZE/BANDAGES/DRESSINGS) ×2
BNDG COHESIVE 4X5 TAN STRL (GAUZE/BANDAGES/DRESSINGS) ×2 IMPLANT
BNDG ELASTIC 4X5.8 VLCR NS LF (GAUZE/BANDAGES/DRESSINGS) ×4 IMPLANT
BNDG ESMARK 4X12 BLUE STRL LF (DISPOSABLE) ×2
CHLORAPREP W/TINT 26 (MISCELLANEOUS) ×4 IMPLANT
CLOTH BEACON ORANGE TIMEOUT ST (SAFETY) ×2 IMPLANT
COVER LIGHT HANDLE STERIS (MISCELLANEOUS) ×4 IMPLANT
COVER WAND RF STERILE (DRAPES) ×2 IMPLANT
CUFF TOURN SGL QUICK 34 (TOURNIQUET CUFF) ×2
CUFF TRNQT CYL 34X4.125X (TOURNIQUET CUFF) ×1 IMPLANT
DECANTER SPIKE VIAL GLASS SM (MISCELLANEOUS) ×4 IMPLANT
DRAPE C-ARM FOLDED MOBILE STRL (DRAPES) ×2 IMPLANT
DRSG XEROFORM 1X8 (GAUZE/BANDAGES/DRESSINGS) ×2 IMPLANT
ELECT REM PT RETURN 9FT ADLT (ELECTROSURGICAL) ×2
ELECTRODE REM PT RTRN 9FT ADLT (ELECTROSURGICAL) ×1 IMPLANT
GAUZE SPONGE 4X4 12PLY STRL (GAUZE/BANDAGES/DRESSINGS) ×2 IMPLANT
GAUZE XEROFORM 5X9 LF (GAUZE/BANDAGES/DRESSINGS) ×2 IMPLANT
GLOVE SKINSENSE NS SZ8.0 LF (GLOVE) ×1
GLOVE SKINSENSE STRL SZ8.0 LF (GLOVE) ×1 IMPLANT
GLOVE SS N UNI LF 8.5 STRL (GLOVE) ×2 IMPLANT
GLOVE SURG UNDER POLY LF SZ7 (GLOVE) ×6 IMPLANT
GOWN STRL REUS W/TWL LRG LVL3 (GOWN DISPOSABLE) ×4 IMPLANT
GOWN STRL REUS W/TWL XL LVL3 (GOWN DISPOSABLE) ×2 IMPLANT
INST SET MINOR BONE (KITS) ×2 IMPLANT
K-WIRE 1.6X150 (WIRE) ×4
K-WIRE FX150X1.6XKRSH (WIRE) ×2
K-WIRE SMOOTH 2.0X150 (WIRE)
KIT TURNOVER KIT A (KITS) ×2 IMPLANT
KWIRE FX150X1.6XKRSH (WIRE) ×2 IMPLANT
KWIRE SMOOTH 2.0X150 (WIRE) IMPLANT
MANIFOLD NEPTUNE II (INSTRUMENTS) ×2 IMPLANT
NEEDLE HYPO 21X1.5 SAFETY (NEEDLE) ×2 IMPLANT
NS IRRIG 1000ML POUR BTL (IV SOLUTION) ×2 IMPLANT
PACK BASIC LIMB (CUSTOM PROCEDURE TRAY) ×2 IMPLANT
PAD ABD 5X9 TENDERSORB (GAUZE/BANDAGES/DRESSINGS) ×2 IMPLANT
PAD ARMBOARD 7.5X6 YLW CONV (MISCELLANEOUS) ×2 IMPLANT
PAD CAST 4YDX4 CTTN HI CHSV (CAST SUPPLIES) ×1 IMPLANT
PADDING CAST COTTON 4X4 STRL (CAST SUPPLIES) ×2
PADDING WEBRIL 4 STERILE (GAUZE/BANDAGES/DRESSINGS) ×2 IMPLANT
PLATE FIBULA 4H (Plate) ×2 IMPLANT
SCREW BONE 14MMX3.5MM (Screw) ×2 IMPLANT
SCREW BONE 18 (Screw) ×2 IMPLANT
SCREW BONE 3.5X16MM (Screw) ×2 IMPLANT
SCREW BONE NON-LCKING 3.5X12MM (Screw) ×6 IMPLANT
SCREW LOCK 3.5X10MM (Screw) ×2 IMPLANT
SCREW LOCKING 3.5X12 (Screw) ×2 IMPLANT
SET BASIN LINEN APH (SET/KITS/TRAYS/PACK) ×2 IMPLANT
SPLINT IMMOBILIZER J 3INX20FT (CAST SUPPLIES) ×1
SPLINT J IMMOBILIZER 3X20FT (CAST SUPPLIES) ×1 IMPLANT
SPLINT J IMMOBILIZER 4X20FT (CAST SUPPLIES) IMPLANT
SPLINT J PLASTER J 4INX20Y (CAST SUPPLIES)
SPONGE LAP 18X18 RF (DISPOSABLE) ×2 IMPLANT
STAPLER VISISTAT 35W (STAPLE) ×2 IMPLANT
SUT ETHILON 3 0 FSL (SUTURE) ×2 IMPLANT
SUT MON AB 0 CT1 (SUTURE) ×4 IMPLANT
SUT MON AB 2-0 CT1 36 (SUTURE) ×4 IMPLANT
SUT VIC AB 1 CT1 27 (SUTURE) ×2
SUT VIC AB 1 CT1 27XBRD ANTBC (SUTURE) ×1 IMPLANT
SYR 30ML LL (SYRINGE) ×2 IMPLANT
SYR BULB IRRIG 60ML STRL (SYRINGE) ×2 IMPLANT

## 2020-10-07 NOTE — Anesthesia Postprocedure Evaluation (Signed)
Anesthesia Post Note  Patient: Lindsey Melton  Procedure(s) Performed: OPEN REDUCTION INTERNAL FIXATION (ORIF) RIGHT ANKLE FRACTURE (Right Ankle)  Patient location during evaluation: PACU Anesthesia Type: General Level of consciousness: awake and alert and oriented Pain management: pain level controlled Vital Signs Assessment: post-procedure vital signs reviewed and stable Respiratory status: spontaneous breathing Cardiovascular status: blood pressure returned to baseline and stable Postop Assessment: no apparent nausea or vomiting Anesthetic complications: no   No complications documented.   Last Vitals:  Vitals:   10/07/20 1315 10/07/20 1330  BP: 135/86 (!) 143/81  Pulse: 77 91  Resp: 12 11  Temp:    SpO2: 94% 95%    Last Pain:  Vitals:   10/07/20 1300  TempSrc:   PainSc: 8                  Nyara Capell

## 2020-10-07 NOTE — Anesthesia Preprocedure Evaluation (Addendum)
Anesthesia Evaluation  Patient identified by MRN, date of birth, ID band Patient awake    Reviewed: Allergy & Precautions, NPO status , Patient's Chart, lab work & pertinent test results  History of Anesthesia Complications Negative for: history of anesthetic complications  Airway Mallampati: III  TM Distance: >3 FB Neck ROM: Full   Comment: Cervical spinal fusion Dental  (+) Dental Advisory Given, Chipped, Poor Dentition, Loose   Pulmonary asthma , COPD, former smoker,    Pulmonary exam normal breath sounds clear to auscultation       Cardiovascular Exercise Tolerance: Good hypertension, Pt. on medications Normal cardiovascular exam Rhythm:Regular Rate:Normal  03-Oct-2020 09:39:00 [facility] ROUTINE RECORD Normal sinus rhythm Normal ECG No significant change since last tracing Confirmed by Nanetta Batty [id number]) on 10/04/2020 8:16:41 AM   Neuro/Psych  Headaches, PSYCHIATRIC DISORDERS Anxiety Depression Bipolar Disorder  Neuromuscular disease (cervical myelopathy, diabetic neuropathy)    GI/Hepatic hiatal hernia, GERD  Medicated,(+)     substance abuse  alcohol use and cocaine use,   Endo/Other  diabetes, Well Controlled, Type 2, Oral Hypoglycemic Agents, Insulin Dependent  Renal/GU Renal InsufficiencyRenal disease  negative genitourinary   Musculoskeletal  (+) Arthritis  (cervical spinal fusion),   Abdominal   Peds negative pediatric ROS (+)  Hematology  (+) anemia ,   Anesthesia Other Findings   Reproductive/Obstetrics negative OB ROS                           Anesthesia Physical Anesthesia Plan  ASA: III  Anesthesia Plan: General   Post-op Pain Management:    Induction: Intravenous  PONV Risk Score and Plan: 4 or greater and Ondansetron and Midazolam  Airway Management Planned: Oral ETT  Additional Equipment:   Intra-op Plan:   Post-operative  Plan: Extubation in OR  Informed Consent: I have reviewed the patients History and Physical, chart, labs and discussed the procedure including the risks, benefits and alternatives for the proposed anesthesia with the patient or authorized representative who has indicated his/her understanding and acceptance.       Plan Discussed with: CRNA and Surgeon  Anesthesia Plan Comments:         Anesthesia Quick Evaluation

## 2020-10-07 NOTE — Brief Op Note (Signed)
10/07/2020  12:26 PM  PATIENT:  Lindsey Melton  70 y.o. female  PRE-OPERATIVE DIAGNOSIS:  trimalleolar fracture right ankle  POST-OPERATIVE DIAGNOSIS:  trimalleolar fracture right ankle  PROCEDURE:  Procedure(s): OPEN REDUCTION INTERNAL FIXATION (ORIF) RIGHT ANKLE FRACTURE (Right)  [employer]  Lateral cluster plate laterally  Combination of locking and nonlocking screws  2, 1.6 K wires medially  Operative findings Weber B trimalleolar ankle fracture  After medial and lateral fixation the ankle was examined under fluoroscopy and there was no syndesmotic widening.  Direct visualization of the syndesmotic ligament showed they were intact.   SURGEON:  Surgeon(s) and Role:    * Vickki Hearing, MD - Primary  Details of the surgery.  [name] was seen in the preop area and examined.  She was cleared for surgery.  I checked her leg confirmed the right ankle as the surgical site and marked.  Chart was reviewed.  The x-rays were reviewed.  The implants were available.  She was taken to the operating room for general anesthesia  She was prepped and draped sterilely we put a tourniquet on her thigh we put a 5 pound sandbag under her right hip and we put 4 folded blankets under her operative leg and secured it with tape  C-arm was brought in from the opposite side with the monitor at the foot of the table.  After timeout the limb was exsanguinated with 4 inch Esmarch.  Tourniquet elevated to 250 mmHg.  I made a lateral incision over the fibula and divided this tissue down to bone up to the peroneal fascia which was split with a pair of scissors.  Subperiosteal dissection exposed the fracture site it was comminuted.  After cleaning the fracture site with irrigation and removing the debris traction and internal rotation reduced the fracture the comminuted fragment was held with a #1 Vicryl suture.  The plate was applied to the bone starting at the holes closest to the  fracture site.  We did not do an interfrag screw the bone was too comminuted.  We placed the plate and held with a bone clamp and then secured it sequentially inserting the screws changing out the nonlocking screws and 2 of the holes for locking screws.  Once we got the first 2 screws and we took an x-ray to confirm mortise reduction and then we proceeded as stated  We then went to the medial side and made a longitudinal incision over the fracture site divided the subcutaneous tissue we encountered multiple branches of the saphenous vein and coagulated them individually.  We debrided the fracture site and irrigated it and then held it with a pointed reduction clamp confirming reduction with x-ray.  We placed two 1.6 K wires.  We adjusted them to obtain anatomic reduction.  We then backed them out bent them and then hammered them back in.  Final reduction was anatomic x-rays looked excellent  Both wounds were irrigated copiously with saline.  We went back over to the medial side to confirm that all the branches of the saphenous vein were coagulated and then we used a interrupted interrupted nylon suture to close the medial side and then we used 0 Monocryl and staples to close the lateral side and then we did field blocks x3 1 for the saphenous nerve I for the superficial peroneal nerve and then 1 posterior to that for the incision  We placed sterile bandages and placed in a posterior splint  We released the tourniquet  the color in the foot returned to normal and good capillary refill was noted.  She was extubated and taken to recovery room in stable condition  The postoperative plan is for her to have 2 weeks of immobilization in a posterior splint followed by removal of staples and sutures and x-ray at that time.  She was then be placed in a nonweightbearing cast.  At 6 weeks we will take the cast off and x-ray the ankle again placing her in a cam walker  From 7 to 12 weeks she will be  nonweightbearing in a cam walker but can start active range of motion exercises out of it 3 times a day  A 12 weeks will take another x-ray and start weightbearing as long as x-rays allow      PHYSICIAN ASSISTANT:   ASSISTANTS: Deborah Dallas  ANESTHESIA:   general  EBL:  20 mL   BLOOD ADMINISTERED:none  DRAINS: none   LOCAL MEDICATIONS USED:  MARCAINE   with epinephrine 30 cc  SPECIMEN:  No Specimen  DISPOSITION OF SPECIMEN:  N/A  COUNTS:  YES  TOURNIQUET:   Total Tourniquet Time Documented: Thigh (Right) - 83 minutes Total: Thigh (Right) - 83 minutes   DICTATION: .Dragon Dictation  PLAN OF CARE: Discharge to home after PACU  PATIENT DISPOSITION:  PACU - hemodynamically stable.   Delay start of Pharmacological VTE agent (>24hrs) due to surgical blood loss or risk of bleeding: not applicable    

## 2020-10-07 NOTE — Progress Notes (Signed)
Dr Alva Garnet at bedside to check pt re: increased pain. No new orders obtained.

## 2020-10-07 NOTE — Anesthesia Procedure Notes (Signed)
Procedure Name: Intubation Date/Time: 10/07/2020 10:31 AM Performed by: Ollen Bowl, CRNA Pre-anesthesia Checklist: Patient identified, Patient being monitored, Timeout performed, Emergency Drugs available and Suction available Patient Re-evaluated:Patient Re-evaluated prior to induction Oxygen Delivery Method: Circle system utilized Preoxygenation: Pre-oxygenation with 100% oxygen Induction Type: IV induction Ventilation: Mask ventilation without difficulty Laryngoscope Size: Mac and 3 Grade View: Grade II Tube type: Oral Tube size: 7.0 mm Number of attempts: 1 Airway Equipment and Method: Stylet Placement Confirmation: ETT inserted through vocal cords under direct vision,  positive ETCO2 and breath sounds checked- equal and bilateral Secured at: 21 cm Tube secured with: Tape Dental Injury: Teeth and Oropharynx as per pre-operative assessment

## 2020-10-07 NOTE — Transfer of Care (Signed)
Immediate Anesthesia Transfer of Care Note  Patient: Lindsey Melton  Procedure(s) Performed: OPEN REDUCTION INTERNAL FIXATION (ORIF) RIGHT ANKLE FRACTURE (Right Ankle)  Patient Location: PACU  Anesthesia Type:General  Level of Consciousness: awake  Airway & Oxygen Therapy: Patient Spontanous Breathing  Post-op Assessment: Report given to RN  Post vital signs: Reviewed and stable  Last Vitals:  Vitals Value Taken Time  BP 167/95 10/07/20 1230  Temp 37.1 C 10/07/20 1230  Pulse 77 10/07/20 1235  Resp 16 10/07/20 1235  SpO2 93 % 10/07/20 1235  Vitals shown include unvalidated device data.  Last Pain:  Vitals:   10/07/20 0854  TempSrc: Oral  PainSc: 0-No pain      Patients Stated Pain Goal: 4 (10/07/20 0854)  Complications: No complications documented.

## 2020-10-07 NOTE — Op Note (Signed)
10/07/2020  12:26 PM  PATIENT:  Lindsey Melton  70 y.o. female  PRE-OPERATIVE DIAGNOSIS:  trimalleolar fracture right ankle  POST-OPERATIVE DIAGNOSIS:  trimalleolar fracture right ankle  PROCEDURE:  Procedure(s): OPEN REDUCTION INTERNAL FIXATION (ORIF) RIGHT ANKLE FRACTURE (Right)  [employer]  Lateral cluster plate laterally  Combination of locking and nonlocking screws  2, 1.6 K wires medially  Operative findings Weber B trimalleolar ankle fracture  After medial and lateral fixation the ankle was examined under fluoroscopy and there was no syndesmotic widening.  Direct visualization of the syndesmotic ligament showed they were intact.   SURGEON:  Surgeon(s) and Role:    * Vickki Hearing, MD - Primary  Details of the surgery.  Lindsey Melton was seen in the preop area and examined.  She was cleared for surgery.  I checked her leg confirmed the right ankle as the surgical site and marked.  Chart was reviewed.  The x-rays were reviewed.  The implants were available.  She was taken to the operating room for general anesthesia  She was prepped and draped sterilely we put a tourniquet on her thigh we put a 5 pound sandbag under her right hip and we put 4 folded blankets under her operative leg and secured it with tape  C-arm was brought in from the opposite side with the monitor at the foot of the table.  After timeout the limb was exsanguinated with 4 inch Esmarch.  Tourniquet elevated to 250 mmHg.  I made a lateral incision over the fibula and divided this tissue down to bone up to the peroneal fascia which was split with a pair of scissors.  Subperiosteal dissection exposed the fracture site it was comminuted.  After cleaning the fracture site with irrigation and removing the debris traction and internal rotation reduced the fracture the comminuted fragment was held with a #1 Vicryl suture.  The plate was applied to the bone starting at the holes closest to the  fracture site.  We did not do an interfrag screw the bone was too comminuted.  We placed the plate and held with a bone clamp and then secured it sequentially inserting the screws changing out the nonlocking screws and 2 of the holes for locking screws.  Once we got the first 2 screws and we took an x-ray to confirm mortise reduction and then we proceeded as stated  We then went to the medial side and made a longitudinal incision over the fracture site divided the subcutaneous tissue we encountered multiple branches of the saphenous vein and coagulated them individually.  We debrided the fracture site and irrigated it and then held it with a pointed reduction clamp confirming reduction with x-ray.  We placed two 1.6 K wires.  We adjusted them to obtain anatomic reduction.  We then backed them out bent them and then hammered them back in.  Final reduction was anatomic x-rays looked excellent  Both wounds were irrigated copiously with saline.  We went back over to the medial side to confirm that all the branches of the saphenous vein were coagulated and then we used a interrupted interrupted nylon suture to close the medial side and then we used 0 Monocryl and staples to close the lateral side and then we did field blocks x3 1 for the saphenous nerve I for the superficial peroneal nerve and then 1 posterior to that for the incision  We placed sterile bandages and placed in a posterior splint  We released the tourniquet  the color in the foot returned to normal and good capillary refill was noted.  She was extubated and taken to recovery room in stable condition  The postoperative plan is for her to have 2 weeks of immobilization in a posterior splint followed by removal of staples and sutures and x-ray at that time.  She was then be placed in a nonweightbearing cast.  At 6 weeks we will take the cast off and x-ray the ankle again placing her in a cam walker  From 7 to 12 weeks she will be  nonweightbearing in a cam walker but can start active range of motion exercises out of it 3 times a day  A 12 weeks will take another x-ray and start weightbearing as long as x-rays allow      PHYSICIAN ASSISTANT:   ASSISTANTS: Barbaraann Rondo  ANESTHESIA:   general  EBL:  20 mL   BLOOD ADMINISTERED:none  DRAINS: none   LOCAL MEDICATIONS USED:  MARCAINE   with epinephrine 30 cc  SPECIMEN:  No Specimen  DISPOSITION OF SPECIMEN:  N/A  COUNTS:  YES  TOURNIQUET:   Total Tourniquet Time Documented: Thigh (Right) - 83 minutes Total: Thigh (Right) - 83 minutes   DICTATION: .Reubin Milan Dictation  PLAN OF CARE: Discharge to home after PACU  PATIENT DISPOSITION:  PACU - hemodynamically stable.   Delay start of Pharmacological VTE agent (>24hrs) due to surgical blood loss or risk of bleeding: not applicable

## 2020-10-07 NOTE — Interval H&P Note (Signed)
History and Physical Interval Note:  10/07/2020 10:16 AM  Lindsey Melton  has presented today for surgery, with the diagnosis of trimalleolar fracture right ankle.  The various methods of treatment have been discussed with the patient and family. After consideration of risks, benefits and other options for treatment, the patient has consented to  Procedure(s): OPEN REDUCTION INTERNAL FIXATION (ORIF) RIGHT ANKLE FRACTURE (Right) as a surgical intervention.  The patient's history has been reviewed, patient examined, no change in status, stable for surgery.  I have reviewed the patient's chart and labs.  Questions were answered to the patient's satisfaction.     Fuller Canada

## 2020-10-07 NOTE — Brief Op Note (Signed)
10/07/2020  12:20 PM  PATIENT:  Mart Piggs  70 y.o. female  PRE-OPERATIVE DIAGNOSIS:  trimalleolar fracture right ankle  POST-OPERATIVE DIAGNOSIS:  trimalleolar fracture right ankle  PROCEDURE:  Procedure(s): OPEN REDUCTION INTERNAL FIXATION (ORIF) RIGHT ANKLE FRACTURE (Right)  [employer]  Lateral cluster plate laterally  Combination of locking and nonlocking screws  2, 1.6 K wires medially  Operative findings Weber B trimalleolar ankle fracture  After medial and lateral fixation the ankle was examined under fluoroscopy and there was no syndesmotic widening.  Direct visualization of the syndesmotic ligament showed they were intact.   SURGEON:  Surgeon(s) and Role:    * Vickki Hearing, MD - Primary  Details of the surgery.  Ms. Krienke was seen in the preop area and examined.  She was cleared for surgery.  I checked her leg confirmed the right ankle as the surgical site and marked.  Chart was reviewed.  The x-rays were reviewed.  The implants were available.  She was taken to the operating room for general anesthesia  She was prepped and draped sterilely we put a tourniquet on her thigh we put a 5 pound sandbag under her right hip and we put 4 folded blankets under her operative leg and secured it with tape  C-arm was brought in from the opposite side with the monitor at the foot of the table.  After timeout the limb was exsanguinated with 4 inch Esmarch.  Tourniquet elevated to 250 mmHg.  I made a lateral incision over the fibula and divided this tissue down to bone up to the peroneal fascia which was split with a pair of scissors.  Subperiosteal dissection exposed the fracture site it was comminuted.  After cleaning the fracture site with irrigation and removing the debris traction and internal rotation reduced the fracture the comminuted fragment was held with a #1 Vicryl suture.  The plate was applied to the bone starting at the holes closest to the  fracture site.  We did not do an interfrag screw the bone was too comminuted.  We placed the plate and held with a bone clamp and then secured it sequentially inserting the screws changing out the nonlocking screws and 2 of the holes for locking screws.  Once we got the first 2 screws and we took an x-ray to confirm mortise reduction and then we proceeded as stated  We then went to the medial side and made a longitudinal incision over the fracture site divided the subcutaneous tissue we encountered multiple branches of the saphenous vein and coagulated them individually.  We debrided the fracture site and irrigated it and then held it with a pointed reduction clamp confirming reduction with x-ray.  We placed two 1.6 K wires.  We adjusted them to obtain anatomic reduction.  We then backed them out bent them and then hammered them back in.  Final reduction was anatomic x-rays looked excellent  Both wounds were irrigated copiously with saline.  We went back over to the medial side to confirm that all the branches of the saphenous vein were coagulated and then we used a interrupted interrupted nylon suture to close the medial side and then we used 0 Monocryl and staples to close the lateral side and then we did field blocks x3 1 for the saphenous nerve I for the superficial peroneal nerve and then 1 posterior to that for the incision  We placed sterile bandages and placed in a posterior splint  We released the tourniquet  the color in the foot returned to normal and good capillary refill was noted.  She was extubated and taken to recovery room in stable condition  The postoperative plan is for her to have 2 weeks of immobilization in a posterior splint followed by removal of staples and sutures and x-ray at that time.  She was then be placed in a nonweightbearing cast.  At 6 weeks we will take the cast off and x-ray the ankle again placing her in a cam walker  From 7 to 12 weeks she will be  nonweightbearing in a cam walker but can start active range of motion exercises out of it 3 times a day  A 12 weeks will take another x-ray and start weightbearing as long as x-rays allow      PHYSICIAN ASSISTANT:   ASSISTANTS: Barbaraann Rondo  ANESTHESIA:   general  EBL:  20 mL   BLOOD ADMINISTERED:none  DRAINS: none   LOCAL MEDICATIONS USED:  MARCAINE   with epinephrine 30 cc  SPECIMEN:  No Specimen  DISPOSITION OF SPECIMEN:  N/A  COUNTS:  YES  TOURNIQUET:   Total Tourniquet Time Documented: Thigh (Right) - 83 minutes Total: Thigh (Right) - 83 minutes   DICTATION: .Reubin Milan Dictation  PLAN OF CARE: Discharge to home after PACU  PATIENT DISPOSITION:  PACU - hemodynamically stable.   Delay start of Pharmacological VTE agent (>24hrs) due to surgical blood loss or risk of bleeding: not applicable

## 2020-10-08 ENCOUNTER — Encounter (HOSPITAL_COMMUNITY): Payer: Self-pay | Admitting: Orthopedic Surgery

## 2020-10-14 DIAGNOSIS — S82851A Displaced trimalleolar fracture of right lower leg, initial encounter for closed fracture: Secondary | ICD-10-CM | POA: Insufficient documentation

## 2020-10-15 ENCOUNTER — Encounter: Payer: Self-pay | Admitting: Orthopedic Surgery

## 2020-10-15 ENCOUNTER — Ambulatory Visit (INDEPENDENT_AMBULATORY_CARE_PROVIDER_SITE_OTHER): Payer: Medicare Other | Admitting: Orthopedic Surgery

## 2020-10-15 ENCOUNTER — Other Ambulatory Visit: Payer: Self-pay

## 2020-10-15 DIAGNOSIS — S82851D Displaced trimalleolar fracture of right lower leg, subsequent encounter for closed fracture with routine healing: Secondary | ICD-10-CM | POA: Diagnosis not present

## 2020-10-15 NOTE — Progress Notes (Signed)
Chief Complaint  Patient presents with  . Post-op Follow-up    Right ankle fracture, ORIF 10/07/20   Encounter Diagnosis  Name Primary?  . Closed trimalleolar fracture of right ankle with routine healing, subsequent encounter s/p ORIF 10/07/20 Yes    PRE-OPERATIVE DIAGNOSIS:  trimalleolar fracture right ankle  POST-OPERATIVE DIAGNOSIS:  trimalleolar fracture right ankle  PROCEDURE:  Procedure(s): OPEN REDUCTION INTERNAL FIXATION (ORIF) RIGHT ANKLE FRACTURE (Right)  [employer]  Lateral cluster plate laterally  Combination of locking and nonlocking screws  2, 1.6 K wires medially  Operative findings Weber B trimalleolar ankle fracture  After medial and lateral fixation the ankle was examined under fluoroscopy and there was no syndesmotic widening.  Direct visualization of the syndesmotic ligament showed they were intact.   Dressing change splint change to sugar tong continue nonweightbearing follow-up in a week for x-ray and suture or staple removal  No orders of the defined types were placed in this encounter.

## 2020-10-15 NOTE — Patient Instructions (Signed)
No weightbearing  Ice and elevation as needed to control swelling

## 2020-10-21 ENCOUNTER — Encounter: Payer: Self-pay | Admitting: Orthopedic Surgery

## 2020-10-21 ENCOUNTER — Ambulatory Visit (INDEPENDENT_AMBULATORY_CARE_PROVIDER_SITE_OTHER): Payer: Medicare Other | Admitting: Orthopedic Surgery

## 2020-10-21 ENCOUNTER — Other Ambulatory Visit: Payer: Self-pay

## 2020-10-21 ENCOUNTER — Ambulatory Visit: Payer: Medicare Other

## 2020-10-21 DIAGNOSIS — S82851D Displaced trimalleolar fracture of right lower leg, subsequent encounter for closed fracture with routine healing: Secondary | ICD-10-CM

## 2020-10-21 MED ORDER — HYDROCODONE-ACETAMINOPHEN 5-325 MG PO TABS: 1.0000 | ORAL_TABLET | Freq: Four times a day (QID) | ORAL | 0 refills | Status: DC | PRN

## 2020-10-21 MED ORDER — IBUPROFEN 800 MG PO TABS: 800.0000 mg | ORAL_TABLET | Freq: Three times a day (TID) | ORAL | 1 refills | Status: DC | PRN

## 2020-10-21 NOTE — Patient Instructions (Addendum)
WEIGHT BEARING WITH WALKING BOOT AND WALKER   Pain control  Start with ibuprofen 800mg  if no relief take hydrocodone

## 2020-10-21 NOTE — Progress Notes (Signed)
Chief Complaint  Patient presents with  . Post-op Follow-up    10/07/20 ORIF right ankle    POD # 14  TRIMALL FRX  Encounter Diagnosis  Name Primary?  . Closed trimalleolar fracture of right ankle with routine healing, subsequent encounter s/p ORIF 10/07/20 Yes   70 year old female trimalleolar fracture right ankle treated with medial K wires x2 and lateral plate  Her postop x-ray looks good her wound looks fine sutures and staples were removed patient be protected weightbearing for the next 4 weeks with a cam walker and a regular walker  Her pain control has been good and we will put her on ibuprofen and hydrocodone and come back in 4 weeks for repeat x-ray

## 2020-11-20 ENCOUNTER — Ambulatory Visit (INDEPENDENT_AMBULATORY_CARE_PROVIDER_SITE_OTHER): Payer: Medicare Other | Admitting: Orthopedic Surgery

## 2020-11-20 ENCOUNTER — Other Ambulatory Visit: Payer: Self-pay

## 2020-11-20 ENCOUNTER — Ambulatory Visit: Payer: Medicare Other

## 2020-11-20 ENCOUNTER — Encounter: Payer: Self-pay | Admitting: Orthopedic Surgery

## 2020-11-20 DIAGNOSIS — S82851D Displaced trimalleolar fracture of right lower leg, subsequent encounter for closed fracture with routine healing: Secondary | ICD-10-CM

## 2020-11-20 MED ORDER — HYDROCODONE-ACETAMINOPHEN 5-325 MG PO TABS: 1.0000 | ORAL_TABLET | Freq: Two times a day (BID) | ORAL | 0 refills | Status: AC

## 2020-11-20 NOTE — Progress Notes (Signed)
Chief Complaint  Patient presents with  . Post-op Follow-up    10/07/20 right ankle ORIF

## 2020-11-20 NOTE — Patient Instructions (Signed)
Continue weight bearing in the cam walking boot  Try to use alternative methods for pain relief Ice  Elevation Tylenol  Advil   F/u in 6 weeks

## 2020-11-20 NOTE — Progress Notes (Signed)
POST OP VISIT   Chief Complaint  Patient presents with  . Post-op Follow-up    10/07/20 right ankle ORIF     Encounter Diagnosis  Name Primary?  . Closed trimalleolar fracture of right ankle with routine healing, subsequent encounter s/p ORIF 10/07/20 Yes     POV # 3  DOS 10/07/20  POD 44  OP NOTE PRE-OPERATIVE DIAGNOSIS:  trimalleolar fracture right ankle  POST-OPERATIVE DIAGNOSIS:  trimalleolar fracture right ankle  PROCEDURE:  Procedure(s): OPEN REDUCTION INTERNAL FIXATION (ORIF) RIGHT ANKLE FRACTURE (Right)  [employer]  Lateral cluster plate laterally  Combination of locking and nonlocking screws  2, 1.6 K wires medially  Operative findings Weber B trimalleolar ankle fracture  INFO:  STILL HAVING SOME PAIN REQUESTING PAIN MEDICATION   Rec tapering opioids    Ankle motion pretty good, wounds good  ASSESSMENT AND PLAN   wbat in cam walker 6 weeks  Xray 6 weeks  Reduce opioids  Encourage ice tylenol  nsaids   Meds ordered this encounter  Medications  . HYDROcodone-acetaminophen (NORCO/VICODIN) 5-325 MG tablet    Sig: Take 1 tablet by mouth in the morning and at bedtime for 7 days.    Dispense:  14 tablet    Refill:  0

## 2021-01-01 ENCOUNTER — Encounter: Payer: Medicare Other | Admitting: Orthopedic Surgery

## 2021-01-01 NOTE — Progress Notes (Deleted)
POST OP   Encounter Diagnosis  Name Primary?   Closed trimalleolar fracture of right ankle with routine healing, subsequent encounter s/p ORIF 10/07/20 Yes    No chief complaint on file.  TREATMENT: ORIF   85 DAYS POST OP   ***

## 2021-01-05 ENCOUNTER — Encounter: Payer: Self-pay | Admitting: Orthopedic Surgery

## 2021-01-05 ENCOUNTER — Ambulatory Visit (INDEPENDENT_AMBULATORY_CARE_PROVIDER_SITE_OTHER): Payer: Medicare Other | Admitting: Orthopedic Surgery

## 2021-01-05 ENCOUNTER — Ambulatory Visit: Payer: Medicare Other

## 2021-01-05 ENCOUNTER — Other Ambulatory Visit: Payer: Self-pay

## 2021-01-05 VITALS — BP 180/110 | HR 82

## 2021-01-05 DIAGNOSIS — S82851D Displaced trimalleolar fracture of right lower leg, subsequent encounter for closed fracture with routine healing: Secondary | ICD-10-CM | POA: Diagnosis not present

## 2021-01-05 NOTE — Progress Notes (Signed)
POST OP   Chief Complaint  Patient presents with   Ankle Pain    SURGERY DATE 10/07/20 R/ feeling a little pain this morning, but other than that its been doing fine.     POST-OPERATIVE DIAGNOSIS:  trimalleolar fracture right ankle   PROCEDURE:  Procedure(s): OPEN REDUCTION INTERNAL FIXATION (ORIF) RIGHT ANKLE FRACTURE (Right)   [employer]    Operative findings Weber B trimalleolar ankle fracture  70 3 months.  ORIF of right ankle  She is doing well.  She is walking with a walker CAM Walker boot  X-ray shows fracture union reasonable mortise reduction  Skin looks clean dry and intact  Working progressive weightbearing as tolerated outside of cam walker follow-up in 3 months for exam

## 2021-02-11 ENCOUNTER — Telehealth: Payer: Self-pay | Admitting: Orthopedic Surgery

## 2021-02-11 NOTE — Telephone Encounter (Signed)
No not normal need to see her.

## 2021-02-11 NOTE — Telephone Encounter (Signed)
Patient called earlier today to ask if it is normal for her right ankle (following frcture/surgery 09/2920) to be 'turning over'. Has questions; please advise.

## 2021-02-11 NOTE — Telephone Encounter (Signed)
I spoke to her and put her on schedule for next week

## 2021-02-19 ENCOUNTER — Ambulatory Visit: Payer: Medicare Other | Admitting: Orthopedic Surgery

## 2021-02-19 DIAGNOSIS — S82851D Displaced trimalleolar fracture of right lower leg, subsequent encounter for closed fracture with routine healing: Secondary | ICD-10-CM

## 2021-02-25 ENCOUNTER — Ambulatory Visit: Payer: Medicare Other | Admitting: Orthopedic Surgery

## 2021-03-02 ENCOUNTER — Ambulatory Visit: Payer: Medicare Other | Admitting: Orthopedic Surgery

## 2021-03-02 DIAGNOSIS — S82851D Displaced trimalleolar fracture of right lower leg, subsequent encounter for closed fracture with routine healing: Secondary | ICD-10-CM

## 2021-03-09 ENCOUNTER — Ambulatory Visit: Payer: Medicare Other | Admitting: Orthopedic Surgery

## 2021-03-09 NOTE — Progress Notes (Deleted)
FOLLOW UP   Encounter Diagnosis  Name Primary?   Closed trimalleolar fracture of right ankle with routine healing, subsequent encounter Yes     No chief complaint on file.    70 year old female status post open treatment internal fixation right ankle for trimalleolar fracture with bimalleolar fixation March 2022 presents complaining of

## 2021-04-06 ENCOUNTER — Other Ambulatory Visit: Payer: Self-pay

## 2021-04-06 ENCOUNTER — Encounter: Payer: Self-pay | Admitting: Orthopedic Surgery

## 2021-04-06 ENCOUNTER — Ambulatory Visit (INDEPENDENT_AMBULATORY_CARE_PROVIDER_SITE_OTHER): Payer: Medicare Other | Admitting: Orthopedic Surgery

## 2021-04-06 VITALS — BP 138/95 | HR 95 | Ht 61.0 in | Wt 160.0 lb

## 2021-04-06 DIAGNOSIS — S82851D Displaced trimalleolar fracture of right lower leg, subsequent encounter for closed fracture with routine healing: Secondary | ICD-10-CM | POA: Diagnosis not present

## 2021-04-06 NOTE — Progress Notes (Signed)
Chief Complaint  Patient presents with   Ankle Injury    ORIF 10/07/20 improving    Encounter Diagnosis  Name Primary?   Closed trimalleolar fracture of right ankle with routine healing, subsequent encounter Yes    Post op 6 months ORIF right ankle medial lateral fixation  No major complaints other than weather changes and raining may cause some discomfort.  Swelling medial ankle.  Ankle range of motion is normal ankle is stable  X-ray in 6 months consider removing pins medially

## 2021-08-24 ENCOUNTER — Telehealth: Payer: Self-pay

## 2021-08-24 NOTE — Telephone Encounter (Signed)
Pt called stating that she knows she has been discharged from our practice for no shows but asked if she could come back.  Explained to pt that we have to follow our no show policy for everyone and it based on her chart, she was even given an additional change but then no showed again.  Pt got very upset and starting raising her voice at me. I tried explaining that it was nothing personal and that we have to follow this policy for everyone, in fairness. Before I could finish speaking, pt hung up the phone on me.

## 2021-09-22 ENCOUNTER — Encounter (HOSPITAL_COMMUNITY): Payer: Self-pay

## 2021-09-22 ENCOUNTER — Emergency Department (HOSPITAL_COMMUNITY)
Admission: EM | Admit: 2021-09-22 | Discharge: 2021-09-22 | Disposition: A | Discharge status: Home / Self Care | Payer: Medicare Other | Attending: Emergency Medicine | Admitting: Emergency Medicine

## 2021-09-22 ENCOUNTER — Other Ambulatory Visit: Payer: Self-pay

## 2021-09-22 ENCOUNTER — Emergency Department (HOSPITAL_COMMUNITY): Payer: Medicare Other

## 2021-09-22 DIAGNOSIS — Z794 Long term (current) use of insulin: Secondary | ICD-10-CM | POA: Insufficient documentation

## 2021-09-22 DIAGNOSIS — W01198A Fall on same level from slipping, tripping and stumbling with subsequent striking against other object, initial encounter: Secondary | ICD-10-CM | POA: Insufficient documentation

## 2021-09-22 DIAGNOSIS — R791 Abnormal coagulation profile: Secondary | ICD-10-CM | POA: Diagnosis not present

## 2021-09-22 DIAGNOSIS — Y9301 Activity, walking, marching and hiking: Secondary | ICD-10-CM | POA: Insufficient documentation

## 2021-09-22 DIAGNOSIS — Y906 Blood alcohol level of 120-199 mg/100 ml: Secondary | ICD-10-CM | POA: Insufficient documentation

## 2021-09-22 DIAGNOSIS — F10920 Alcohol use, unspecified with intoxication, uncomplicated: Secondary | ICD-10-CM | POA: Insufficient documentation

## 2021-09-22 DIAGNOSIS — S0993XA Unspecified injury of face, initial encounter: Secondary | ICD-10-CM | POA: Diagnosis present

## 2021-09-22 DIAGNOSIS — S0181XA Laceration without foreign body of other part of head, initial encounter: Secondary | ICD-10-CM | POA: Diagnosis not present

## 2021-09-22 DIAGNOSIS — S01411A Laceration without foreign body of right cheek and temporomandibular area, initial encounter: Secondary | ICD-10-CM | POA: Diagnosis not present

## 2021-09-22 DIAGNOSIS — R4182 Altered mental status, unspecified: Secondary | ICD-10-CM | POA: Diagnosis not present

## 2021-09-22 DIAGNOSIS — W19XXXA Unspecified fall, initial encounter: Secondary | ICD-10-CM

## 2021-09-22 DIAGNOSIS — S0990XA Unspecified injury of head, initial encounter: Secondary | ICD-10-CM

## 2021-09-22 DIAGNOSIS — S0083XA Contusion of other part of head, initial encounter: Secondary | ICD-10-CM

## 2021-09-22 DIAGNOSIS — R519 Headache, unspecified: Secondary | ICD-10-CM | POA: Diagnosis present

## 2021-09-22 LAB — CBC WITH DIFFERENTIAL/PLATELET
Abs Immature Granulocytes: 0.06 10*3/uL (ref 0.00–0.07)
Basophils Absolute: 0.1 10*3/uL (ref 0.0–0.1)
Basophils Relative: 1 %
Eosinophils Absolute: 0.5 10*3/uL (ref 0.0–0.5)
Eosinophils Relative: 6 %
HCT: 37.7 % (ref 36.0–46.0)
Hemoglobin: 12.3 g/dL (ref 12.0–15.0)
Immature Granulocytes: 1 %
Lymphocytes Relative: 23 %
Lymphs Abs: 1.8 10*3/uL (ref 0.7–4.0)
MCH: 31.4 pg (ref 26.0–34.0)
MCHC: 32.6 g/dL (ref 30.0–36.0)
MCV: 96.2 fL (ref 80.0–100.0)
Monocytes Absolute: 0.6 10*3/uL (ref 0.1–1.0)
Monocytes Relative: 7 %
Neutro Abs: 5 10*3/uL (ref 1.7–7.7)
Neutrophils Relative %: 62 %
Platelets: 384 10*3/uL (ref 150–400)
RBC: 3.92 MIL/uL (ref 3.87–5.11)
RDW: 13.4 % (ref 11.5–15.5)
WBC: 8.1 10*3/uL (ref 4.0–10.5)
nRBC: 0 % (ref 0.0–0.2)

## 2021-09-22 LAB — COMPREHENSIVE METABOLIC PANEL
ALT: 29 U/L (ref 0–44)
AST: 25 U/L (ref 15–41)
Albumin: 4.3 g/dL (ref 3.5–5.0)
Alkaline Phosphatase: 128 U/L — ABNORMAL HIGH (ref 38–126)
Anion gap: 12 (ref 5–15)
BUN: 21 mg/dL (ref 8–23)
CO2: 22 mmol/L (ref 22–32)
Calcium: 9 mg/dL (ref 8.9–10.3)
Chloride: 104 mmol/L (ref 98–111)
Creatinine, Ser: 0.93 mg/dL (ref 0.44–1.00)
GFR, Estimated: >60 mL/min (ref >60)
Glucose, Bld: 148 mg/dL — ABNORMAL HIGH (ref 70–99)
Potassium: 3.7 mmol/L (ref 3.5–5.1)
Sodium: 138 mmol/L (ref 135–145)
Total Bilirubin: 0.2 mg/dL — ABNORMAL LOW (ref 0.3–1.2)
Total Protein: 7 g/dL (ref 6.5–8.1)

## 2021-09-22 LAB — ETHANOL: Alcohol, Ethyl (B): 143 mg/dL — ABNORMAL HIGH (ref <10)

## 2021-09-22 LAB — PROTIME-INR
INR: 0.9 (ref 0.8–1.2)
Prothrombin Time: 12.5 seconds (ref 11.4–15.2)

## 2021-09-22 NOTE — ED Provider Notes (Signed)
[facility] EMERGENCY DEPARTMENT Provider Note   CSN: [id number] Arrival date & time: 09/22/21  1618     History  Chief Complaint  Patient presents with   Fall   Facial Injury   Alcohol Intoxication    Lindsey Melton is a 71 y.o. female.  HPI She presents for evaluation of injury to face.  She states she was going outside, walking with her walker when she fell striking her face.  She reports pain and swelling in her face but denies other injuries.    Home Medications Prior to Admission medications   Medication Sig Start Date End Date Taking? Authorizing Provider  atorvastatin (LIPITOR) 10 MG tablet TAKE ONE TABLET BY MOUTH ONCE DAILY. 08/13/16  Yes Elenora Gamma, MD  brimonidine (ALPHAGAN) 0.2 % ophthalmic solution Place 1 drop into the right eye 3 (three) times daily. 09/24/19  Yes [provider]  dorzolamide-timolol (COSOPT) 22.3-6.8 MG/ML ophthalmic solution Place 1 drop into both eyes 2 (two) times daily. 09/14/19  Yes [provider]  famotidine (PEPCID) 20 MG tablet Take 20 mg by mouth 2 (two) times daily. 09/09/21  Yes [provider]  Fluticasone-Salmeterol (ADVAIR) 250-50 MCG/DOSE AEPB Inhale 1 puff into the lungs 2 (two) times daily.   Yes [provider]  hydrochlorothiazide (HYDRODIURIL) 25 MG tablet Take 25 mg by mouth daily. 09/12/21  Yes [provider]  ibuprofen (ADVIL) 800 MG tablet Take 1 tablet (800 mg total) by mouth every 8 (eight) hours as needed. 10/21/20  Yes Vickki Hearing, MD  insulin glargine (LANTUS) 100 UNIT/ML injection Inject 70 Units into the skin at bedtime.   Yes [provider]  lamoTRIgine (LAMICTAL) 100 MG tablet Take 100 mg by mouth daily. 09/17/19  Yes [provider]  latanoprost (XALATAN) 0.005 % ophthalmic solution Place 1 drop into both eyes at bedtime. 09/21/19  Yes [provider]  lisinopril (ZESTRIL) 40 MG tablet Take 40 mg by mouth daily. 08/26/21  Yes [provider]  omeprazole (PRILOSEC) 40 MG capsule Take 40 mg by mouth 2 (two) times daily. 09/28/19  Yes [provider]  risperiDONE (RISPERDAL) 1 MG tablet Take 1 tablet by mouth at bedtime. 09/12/19  Yes [provider]  venlafaxine (EFFEXOR) 75 MG tablet Take 75 mg by mouth daily.    Yes [provider]  VENTOLIN HFA 108 (90 Base) MCG/ACT inhaler INHALE 2 PUFFS INTO THE LUNGS EVERY 4 HOURS AS NEEDED. Patient taking differently: Inhale 2 puffs into the lungs every 4 (four) hours as needed for wheezing. 07/13/16  Yes Elenora Gamma, MD  liraglutide 18 MG/3ML SOPN Inject 0.2 mLs (1.2 mg total) into the skin daily. Start with 0.1 mLs (0.6 mg) daily for 1 week, then increase to 0.2 mLs daily Patient not taking: Reported on 09/22/2021 05/06/16   Elenora Gamma, MD  lisinopril (PRINIVIL,ZESTRIL) 20 MG tablet Take 1 tablet (20 mg total) by mouth daily. Patient not taking: Reported on 09/22/2021 05/06/16   Elenora Gamma, MD  meloxicam (MOBIC) 7.5 MG tablet Take 7.5 mg by mouth 2 (two) times daily. 10/11/19   [provider]  topiramate (TOPAMAX) 100 MG tablet Take 1 tablet (100 mg total) by mouth 2 (two) times daily. Patient not taking: Reported on 09/22/2021 07/28/15   Elenora Gamma, MD      Allergies    Fluoxetine hcl, Prozac [fluoxetine hcl], and Gabapentin    Review of Systems   Review of Systems  Physical  Exam Updated Vital Signs BP (!) 158/94   Pulse (!) 113   Temp 98 F (36.7 C)   Resp 17   SpO2 97%  Physical Exam Vitals and nursing note reviewed.  Constitutional:      General: She is in acute distress (She is uncomfortable).     Appearance: She is well-developed. She is not ill-appearing.  HENT:     Head: Normocephalic.     Comments: Facial bruising, bilateral periorbital and right cheek with swelling but no deformity or crepitation.  Normal TMJ function.  No trismus.  No visible dental injury.  No bleeding from the nose.  Nose is  slightly swollen, cause not clear.  Laceration right cheek, gaping but not bleeding.    Right Ear: External ear normal.     Left Ear: External ear normal.  Eyes:     Conjunctiva/sclera: Conjunctivae normal.     Pupils: Pupils are equal, round, and reactive to light.  Neck:     Trachea: Phonation normal.  Cardiovascular:     Rate and Rhythm: Normal rate.  Pulmonary:     Effort: Pulmonary effort is normal.  Chest:     Chest wall: No tenderness.  Abdominal:     Palpations: Abdomen is soft.     Tenderness: There is no abdominal tenderness.  Musculoskeletal:        General: No swelling, tenderness, deformity or signs of injury. Normal range of motion.     Cervical back: Normal range of motion and neck supple.  Skin:    General: Skin is warm and dry.  Neurological:     Mental Status: She is alert and oriented to person, place, and time.     Cranial Nerves: No cranial nerve deficit.     Sensory: No sensory deficit.     Motor: No abnormal muscle tone.     Coordination: Coordination normal.  Psychiatric:        Mood and Affect: Mood normal.        Behavior: Behavior normal.        Thought Content: Thought content normal.        Judgment: Judgment normal.    ED Results / Procedures / Treatments   Labs (all labs ordered are listed, but only abnormal results are displayed) Labs Reviewed  COMPREHENSIVE METABOLIC PANEL - Abnormal; Notable for the following components:      Result Value   Glucose, Bld 148 (*)    Alkaline Phosphatase 128 (*)    Total Bilirubin 0.2 (*)    All other components within normal limits  ETHANOL - Abnormal; Notable for the following components:   Alcohol, Ethyl (B) 143 (*)    All other components within normal limits  CBC WITH DIFFERENTIAL/PLATELET  PROTIME-INR    EKG None  Radiology No results found. CT Head Wo Contrast  Result Date: 09/22/2021 CLINICAL DATA:  Polytrauma, blunt fall EXAM: CT HEAD WITHOUT CONTRAST TECHNIQUE: Contiguous axial images  were obtained from the base of the skull through the vertex without intravenous contrast. RADIATION DOSE REDUCTION: This exam was performed according to the departmental dose-optimization program which includes automated exposure control, adjustment of the mA and/or kV according to patient size and/or use of iterative reconstruction technique. COMPARISON:  09/17/2021 FINDINGS: Brain: No acute intracranial abnormality. Specifically, no hemorrhage, hydrocephalus, mass lesion, acute infarction, or significant intracranial injury. Vascular: No hyperdense vessel or unexpected calcification. Skull: No acute calvarial abnormality. Sinuses/Orbits: No acute findings Other: Soft tissue swelling over the right orbit and  forehead. IMPRESSION: No acute intracranial abnormality. Electronically Signed   By: Charlett Nose M.D.   On: 09/22/2021 18:07   CT Cervical Spine Wo Contrast  Result Date: 09/22/2021 CLINICAL DATA:  Polytrauma, blunt.  Fall. EXAM: CT CERVICAL SPINE WITHOUT CONTRAST TECHNIQUE: Multidetector CT imaging of the cervical spine was performed without intravenous contrast. Multiplanar CT image reconstructions were also generated. RADIATION DOSE REDUCTION: This exam was performed according to the departmental dose-optimization program which includes automated exposure control, adjustment of the mA and/or kV according to patient size and/or use of iterative reconstruction technique. COMPARISON:  09/17/2021 FINDINGS: Alignment: No subluxation. Skull base and vertebrae: No acute fracture. No primary bone lesion or focal pathologic process. Soft tissues and spinal canal: No prevertebral fluid or swelling. No visible canal hematoma. Disc levels: Anterior and posterior fusion changes at C3-4. Diffuse advanced degenerative disc disease and facet disease. Upper chest: No acute findings Other: None IMPRESSION: Diffuse advanced degenerative disc and facet disease. Postoperative changes at C3-4. No acute bony abnormality.  Electronically Signed   By: Charlett Nose M.D.   On: 09/22/2021 18:11   CT Maxillofacial WO CM  Result Date: 09/22/2021 CLINICAL DATA:  Fall.  Facial trauma, blunt EXAM: CT MAXILLOFACIAL WITHOUT CONTRAST TECHNIQUE: Multidetector CT imaging of the maxillofacial structures was performed. Multiplanar CT image reconstructions were also generated. RADIATION DOSE REDUCTION: This exam was performed according to the departmental dose-optimization program which includes automated exposure control, adjustment of the mA and/or kV according to patient size and/or use of iterative reconstruction technique. COMPARISON:  09/02/2018 FINDINGS: Osseous: No fracture or mandibular dislocation. No destructive process. Orbits: No fracture.  Globes intact. Sinuses: Clear Soft tissues: Soft tissue swelling over the right face, orbit and forehead. Limited intracranial: See head CT report IMPRESSION: No facial or orbital fracture. Electronically Signed   By: Charlett Nose M.D.   On: 09/22/2021 18:10      Procedures .Marland KitchenLaceration Repair  Date/Time: 09/22/2021 10:14 PM Performed by: Mancel Bale, MD Authorized by: Mancel Bale, MD   Consent:    Consent obtained:  Verbal Anesthesia:    Anesthesia method:  None Laceration details:    Location:  Face   Face location:  R cheek   Length (cm):  2   Depth (mm):  7 Pre-procedure details:    Preparation:  Patient was prepped and draped in usual sterile fashion Exploration:    Hemostasis achieved with:  Direct pressure   Wound extent: no fascia violation noted and no foreign bodies/material noted     Contaminated: no   Treatment:    Area cleansed with:  Saline   Amount of cleaning:  Standard Skin repair:    Repair method:  Tissue adhesive Approximation:    Approximation:  Loose Repair type:    Repair type:  Simple Post-procedure details:    Dressing:  Open (no dressing)    Medications Ordered in ED Medications - No data to display  ED Course/ Medical Decision  Making/ A&P                           Medical Decision Making Presenting for injury to face from fall, likely mechanical low walking with walker.  She is reported to be intoxicated by nursing.  Problems Addressed: Alcoholic intoxication without complication [facility]): acute illness or injury    Details: Likely cause of fall Facial laceration, initial encounter: acute illness or injury Fall, initial encounter: acute illness or injury    Details:  Instability due to alcohol intoxication  Amount and/or Complexity of Data Reviewed Independent Historian: caregiver    Details: Patient gives history, supplemented by family member at the bedside. Labs: ordered.    Details: CBC, metabolic panel, alcohol level-normal except glucose high, prostate high, alcohol level high Radiology: ordered.    Details: CT head, cervical spine, face-no acute injuries including intracranial bleeding or facial fractures.  Risk OTC drugs. Decision regarding hospitalization. Risk Details: Patient with fall while apparently using her walker and going down some steps.  She had injury to face and head.  She arrives lucid, and cooperative.  Patient moderately intoxicated, which is apparently a recurrent issue, suggesting alcoholism.  Patient has a supportive family member who arrived to help her and take her home.  Facial laceration sutured and instructions given for wound care at home.  Patient observed and did not have further problems with intoxication or hemodynamic disorders requiring intervention.  She does not require hospitalization or further intervention.           Final Clinical Impression(s) / ED Diagnoses Final diagnoses:  Alcoholic intoxication without complication (HCC)  Fall, initial encounter  Injury of head, initial encounter  Contusion of face, initial encounter  Facial laceration, initial encounter    Rx / DC Orders ED Discharge Orders     None         Mancel Bale, MD 09/25/21  1107

## 2021-09-22 NOTE — ED Triage Notes (Signed)
Pt arrives via ems from home. Pt states she lost control of her walker and fell. Pt struck her face. Significant bruising and swelling noted to face and around both eyes. Pt denies loc, no blood thinners. Reports some etoh use today.  ?

## 2021-09-22 NOTE — Discharge Instructions (Addendum)
Use Tylenol if needed for pain.  Use ice on sore areas 3-4 times a day.  See your doctor as needed for problems ?

## 2021-10-08 ENCOUNTER — Telehealth: Payer: Self-pay

## 2021-10-08 ENCOUNTER — Ambulatory Visit: Payer: Medicare Other | Admitting: Orthopedic Surgery

## 2021-10-08 NOTE — Progress Notes (Deleted)
FOLLOW UP  ? ?Encounter Diagnosis  ?Name Primary?  ? Closed trimalleolar fracture of right ankle with routine healing, subsequent encounter Yes  ? ? ? ?No chief complaint on file. ? ? ? ?Lindsey Melton is 71 years old she is status post open reduction internal fixation trimalleolar right ankle fracture 1 year ago she is here for the 1 year follow-up x-ray.  She is considering having her medial pins removed ?

## 2021-10-08 NOTE — Telephone Encounter (Signed)
Call primary doctor

## 2021-10-08 NOTE — Telephone Encounter (Signed)
Tried calling patient x 2. Call could not be completed.  ?

## 2021-10-08 NOTE — Telephone Encounter (Signed)
Patient is requesting antibiotics for a rash on the inside(right side) of her mouth. States Dr. Romeo Apple has done this before. ? ?PATIENT USES [facility] ?

## 2021-10-09 NOTE — Progress Notes (Signed)
Patient was evaluated in the ED on 09/12/2021 for altered mental status with head injury.  She has history of alcoholism.  Serum alcohol level, and urine drug screen were ordered to evaluate for causes of altered mental status to help differentiate these from altered mental status due to head injury.  Patient was found to be intoxicated with alcohol.  She had stable neurologic status during the ED evaluation was able to be discharged after period of observation. ?

## 2021-11-08 IMAGING — DX DG ANKLE PORT 2V*R*
3 series · 3 of 3 positions shown · non-contrast
Comparison: None.

CLINICAL DATA: Fall, ankle pain, deformity

EXAM:
PORTABLE RIGHT ANKLE - 2 VIEW

[ankle ap]
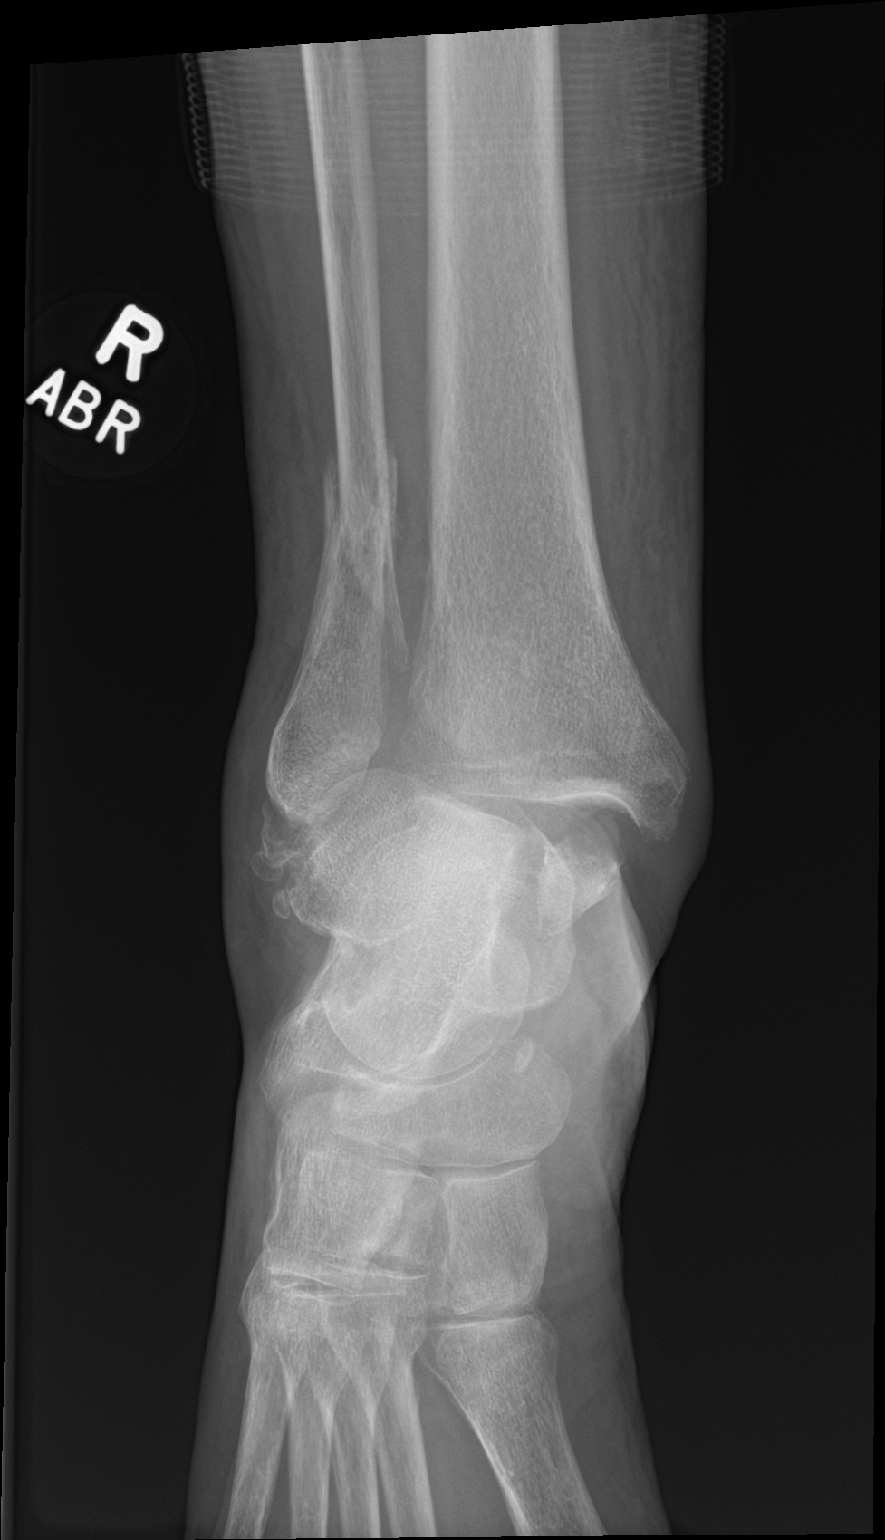

[ankle lat]
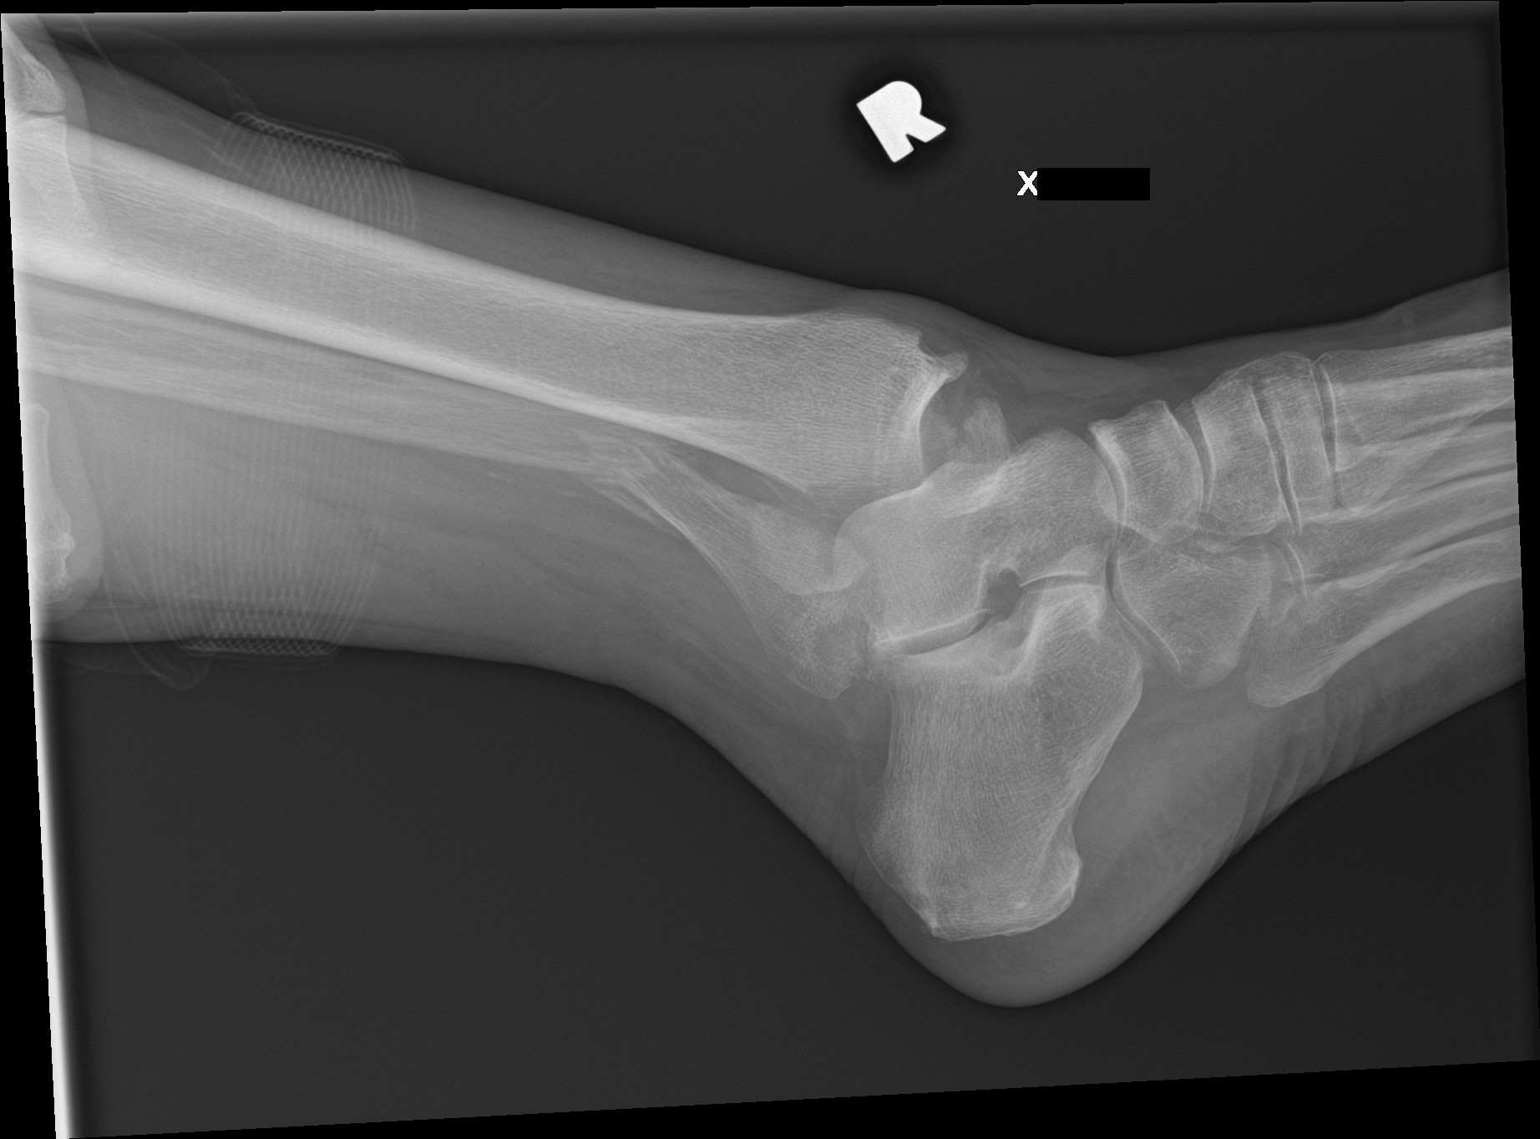

[ankle obl]
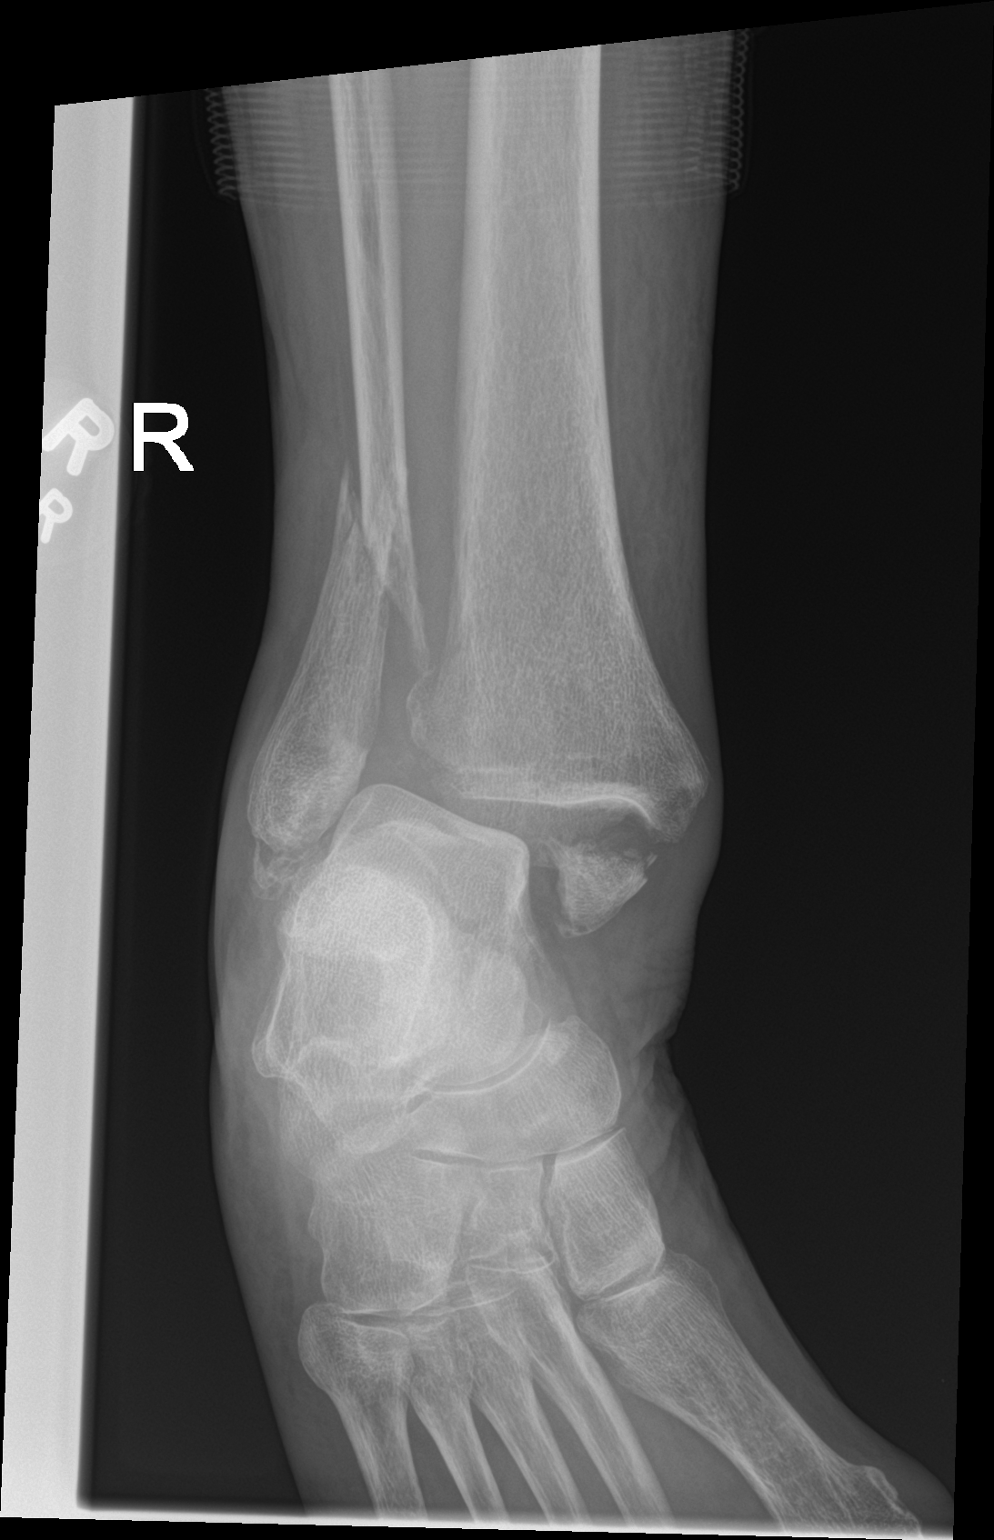

[3 of 3 positions shown; findings below may reference images not displayed]

FINDINGS: Trimalleolar fracture dislocation noted at the right ankle. Fracture
through the distal fibular metaphysis, medial malleolus and
posterior malleolus. Fracture fragments also noted at the lateral
malleolus. The talus is posteriorly and laterally dislocated
relative to the tibia.
IMPRESSION: Right ankle trimalleolar fracture dislocation.

## 2022-06-18 ENCOUNTER — Other Ambulatory Visit: Payer: Self-pay

## 2022-06-18 ENCOUNTER — Emergency Department (HOSPITAL_COMMUNITY): Payer: Medicare Other

## 2022-06-18 ENCOUNTER — Encounter (HOSPITAL_COMMUNITY): Payer: Self-pay

## 2022-06-18 ENCOUNTER — Inpatient Hospital Stay (HOSPITAL_COMMUNITY)
Admission: EM | Admit: 2022-06-18 | Discharge: 2022-06-30 | DRG: 177 | Disposition: A | Discharge status: Skilled Nursing Facility | Payer: Medicare Other | Attending: Internal Medicine | Admitting: Internal Medicine

## 2022-06-18 DIAGNOSIS — F1010 Alcohol abuse, uncomplicated: Secondary | ICD-10-CM

## 2022-06-18 DIAGNOSIS — Z825 Family history of asthma and other chronic lower respiratory diseases: Secondary | ICD-10-CM

## 2022-06-18 DIAGNOSIS — U071 COVID-19: Secondary | ICD-10-CM | POA: Diagnosis not present

## 2022-06-18 DIAGNOSIS — G9341 Metabolic encephalopathy: Secondary | ICD-10-CM | POA: Diagnosis present

## 2022-06-18 DIAGNOSIS — E1165 Type 2 diabetes mellitus with hyperglycemia: Secondary | ICD-10-CM | POA: Diagnosis present

## 2022-06-18 DIAGNOSIS — E119 Type 2 diabetes mellitus without complications: Secondary | ICD-10-CM

## 2022-06-18 DIAGNOSIS — F3132 Bipolar disorder, current episode depressed, moderate: Secondary | ICD-10-CM | POA: Diagnosis present

## 2022-06-18 DIAGNOSIS — K219 Gastro-esophageal reflux disease without esophagitis: Secondary | ICD-10-CM | POA: Diagnosis present

## 2022-06-18 DIAGNOSIS — R4182 Altered mental status, unspecified: Secondary | ICD-10-CM | POA: Diagnosis not present

## 2022-06-18 DIAGNOSIS — Z6830 Body mass index (BMI) 30.0-30.9, adult: Secondary | ICD-10-CM

## 2022-06-18 DIAGNOSIS — J189 Pneumonia, unspecified organism: Secondary | ICD-10-CM

## 2022-06-18 DIAGNOSIS — R5381 Other malaise: Secondary | ICD-10-CM

## 2022-06-18 DIAGNOSIS — G9340 Encephalopathy, unspecified: Secondary | ICD-10-CM

## 2022-06-18 DIAGNOSIS — J440 Chronic obstructive pulmonary disease with acute lower respiratory infection: Secondary | ICD-10-CM | POA: Diagnosis present

## 2022-06-18 DIAGNOSIS — F1410 Cocaine abuse, uncomplicated: Secondary | ICD-10-CM | POA: Diagnosis present

## 2022-06-18 DIAGNOSIS — Z87891 Personal history of nicotine dependence: Secondary | ICD-10-CM

## 2022-06-18 DIAGNOSIS — J1282 Pneumonia due to coronavirus disease 2019: Secondary | ICD-10-CM | POA: Diagnosis present

## 2022-06-18 DIAGNOSIS — Z794 Long term (current) use of insulin: Secondary | ICD-10-CM

## 2022-06-18 DIAGNOSIS — N390 Urinary tract infection, site not specified: Secondary | ICD-10-CM | POA: Diagnosis present

## 2022-06-18 DIAGNOSIS — E1159 Type 2 diabetes mellitus with other circulatory complications: Secondary | ICD-10-CM | POA: Diagnosis present

## 2022-06-18 DIAGNOSIS — Z79899 Other long term (current) drug therapy: Secondary | ICD-10-CM

## 2022-06-18 DIAGNOSIS — E669 Obesity, unspecified: Secondary | ICD-10-CM | POA: Diagnosis present

## 2022-06-18 DIAGNOSIS — I152 Hypertension secondary to endocrine disorders: Secondary | ICD-10-CM | POA: Diagnosis present

## 2022-06-18 DIAGNOSIS — Z7985 Long-term (current) use of injectable non-insulin antidiabetic drugs: Secondary | ICD-10-CM

## 2022-06-18 DIAGNOSIS — Z823 Family history of stroke: Secondary | ICD-10-CM

## 2022-06-18 DIAGNOSIS — Z90710 Acquired absence of both cervix and uterus: Secondary | ICD-10-CM

## 2022-06-18 DIAGNOSIS — E11649 Type 2 diabetes mellitus with hypoglycemia without coma: Secondary | ICD-10-CM | POA: Diagnosis not present

## 2022-06-18 DIAGNOSIS — N3000 Acute cystitis without hematuria: Secondary | ICD-10-CM

## 2022-06-18 DIAGNOSIS — E1169 Type 2 diabetes mellitus with other specified complication: Secondary | ICD-10-CM | POA: Diagnosis present

## 2022-06-18 DIAGNOSIS — Z8249 Family history of ischemic heart disease and other diseases of the circulatory system: Secondary | ICD-10-CM

## 2022-06-18 DIAGNOSIS — Z7951 Long term (current) use of inhaled steroids: Secondary | ICD-10-CM

## 2022-06-18 DIAGNOSIS — J1108 Influenza due to unidentified influenza virus with specified pneumonia: Secondary | ICD-10-CM | POA: Diagnosis present

## 2022-06-18 DIAGNOSIS — Z818 Family history of other mental and behavioral disorders: Secondary | ICD-10-CM

## 2022-06-18 DIAGNOSIS — E785 Hyperlipidemia, unspecified: Secondary | ICD-10-CM | POA: Diagnosis present

## 2022-06-18 DIAGNOSIS — Z833 Family history of diabetes mellitus: Secondary | ICD-10-CM

## 2022-06-18 LAB — CBC WITH DIFFERENTIAL/PLATELET
Abs Immature Granulocytes: 0.04 10*3/uL (ref 0.00–0.07)
Basophils Absolute: 0 10*3/uL (ref 0.0–0.1)
Basophils Relative: 0 %
Eosinophils Absolute: 0.3 10*3/uL (ref 0.0–0.5)
Eosinophils Relative: 4 %
HCT: 40 % (ref 36.0–46.0)
Hemoglobin: 13.7 g/dL (ref 12.0–15.0)
Immature Granulocytes: 1 %
Lymphocytes Relative: 22 %
Lymphs Abs: 1.5 10*3/uL (ref 0.7–4.0)
MCH: 30.5 pg (ref 26.0–34.0)
MCHC: 34.3 g/dL (ref 30.0–36.0)
MCV: 89.1 fL (ref 80.0–100.0)
Monocytes Absolute: 0.6 10*3/uL (ref 0.1–1.0)
Monocytes Relative: 9 %
Neutro Abs: 4.3 10*3/uL (ref 1.7–7.7)
Neutrophils Relative %: 64 %
Platelets: 377 10*3/uL (ref 150–400)
RBC: 4.49 MIL/uL (ref 3.87–5.11)
RDW: 11.9 % (ref 11.5–15.5)
WBC: 6.7 10*3/uL (ref 4.0–10.5)
nRBC: 0 % (ref 0.0–0.2)

## 2022-06-18 LAB — COMPREHENSIVE METABOLIC PANEL
ALT: 20 U/L (ref 0–44)
AST: 20 U/L (ref 15–41)
Albumin: 3.5 g/dL (ref 3.5–5.0)
Alkaline Phosphatase: 160 U/L — ABNORMAL HIGH (ref 38–126)
Anion gap: 11 (ref 5–15)
BUN: 19 mg/dL (ref 8–23)
CO2: 22 mmol/L (ref 22–32)
Calcium: 9.5 mg/dL (ref 8.9–10.3)
Chloride: 107 mmol/L (ref 98–111)
Creatinine, Ser: 0.38 mg/dL — ABNORMAL LOW (ref 0.44–1.00)
GFR, Estimated: >60 mL/min (ref >60)
Glucose, Bld: 209 mg/dL — ABNORMAL HIGH (ref 70–99)
Potassium: 3.6 mmol/L (ref 3.5–5.1)
Sodium: 140 mmol/L (ref 135–145)
Total Bilirubin: 0.1 mg/dL — ABNORMAL LOW (ref 0.3–1.2)
Total Protein: 7.5 g/dL (ref 6.5–8.1)

## 2022-06-18 LAB — ETHANOL: Alcohol, Ethyl (B): <10 mg/dL (ref <10)

## 2022-06-18 LAB — AMMONIA: Ammonia: 37 umol/L — ABNORMAL HIGH (ref 9–35)

## 2022-06-18 LAB — CBG MONITORING, ED: Glucose-Capillary: 204 mg/dL — ABNORMAL HIGH (ref 70–99)

## 2022-06-18 MED ORDER — THIAMINE HCL 100 MG PO TABS
100.0000 mg | ORAL_TABLET | Freq: Once | ORAL | Status: AC
  Administered 2022-06-19: 100 mg via ORAL
  Filled 2022-06-18: qty 1

## 2022-06-18 MED ORDER — SODIUM CHLORIDE 0.9 % IV BOLUS
1000.0000 mL | Freq: Once | INTRAVENOUS | Status: AC
  Administered 2022-06-19: 1000 mL via INTRAVENOUS

## 2022-06-18 NOTE — ED Triage Notes (Addendum)
Pt BIB EMS from home with AMS. Pt states that she is always confused. Pt lives at home alone.

## 2022-06-18 NOTE — ED Provider Notes (Signed)
[facility]-EMERGENCY DEPT Provider Note   CSN: [id number] Arrival date & time: 06/18/22  2116    [facility] History  Chief Complaint  Patient presents with   Altered Mental Status    Lindsey Melton is a 71 y.o. female brought to ED by EMS for altered mental status.  Patient lives at home alone and states she does not have family nearby.  She is only oriented to self.  Patient is upset and is aware of her acute confusion.  Patient begins crying when asked orientation questions because she does not know the answer.  She has history significant for polysubstance abuse, alcoholism, bipolar 1 disorder, MDD, type 2 diabetes.  Patient denies pain, but is not answering other ROS questions.       Home Medications Prior to Admission medications   Medication Sig Start Date End Date Taking? Authorizing Provider  atorvastatin (LIPITOR) 10 MG tablet TAKE ONE TABLET BY MOUTH ONCE DAILY. 08/13/16   Elenora GammaBradshaw, Samuel L, MD  brimonidine (ALPHAGAN) 0.2 % ophthalmic solution Place 1 drop into the right eye 3 (three) times daily. 09/24/19   [provider]  dorzolamide-timolol (COSOPT) 22.3-6.8 MG/ML ophthalmic solution Place 1 drop into both eyes 2 (two) times daily. 09/14/19   [provider]  famotidine (PEPCID) 20 MG tablet Take 20 mg by mouth 2 (two) times daily. 09/09/21   [provider]  Fluticasone-Salmeterol (ADVAIR) 250-50 MCG/DOSE AEPB Inhale 1 puff into the lungs 2 (two) times daily.    [provider]  hydrochlorothiazide (HYDRODIURIL) 25 MG tablet Take 25 mg by mouth daily. 09/12/21   [provider]  ibuprofen (ADVIL) 800 MG tablet Take 1 tablet (800 mg total) by mouth every 8 (eight) hours as needed. 10/21/20   Vickki HearingHarrison, Stanley E, MD  insulin glargine (LANTUS) 100 UNIT/ML injection Inject 70 Units into the skin at bedtime.    [provider]  lamoTRIgine (LAMICTAL) 100 MG tablet Take 100 mg by mouth daily. 09/17/19   [provider]  latanoprost (XALATAN) 0.005 % ophthalmic solution Place 1 drop into both eyes at bedtime. 09/21/19   [provider]  liraglutide 18 MG/3ML SOPN Inject 0.2 mLs (1.2 mg total) into the skin daily. Start with 0.1 mLs (0.6 mg) daily for 1 week, then increase to 0.2 mLs daily Patient not taking: Reported on 09/22/2021 05/06/16   Elenora GammaBradshaw, Samuel L, MD  lisinopril (PRINIVIL,ZESTRIL) 20 MG tablet Take 1 tablet (20 mg total) by mouth daily. Patient not taking: Reported on 09/22/2021 05/06/16   Elenora GammaBradshaw, Samuel L, MD  lisinopril (ZESTRIL) 40 MG tablet Take 40 mg by mouth daily. 08/26/21   [provider]  meloxicam (MOBIC) 7.5 MG tablet Take 7.5 mg by mouth 2 (two) times daily. 10/11/19   [provider]  omeprazole (PRILOSEC) 40 MG capsule Take 40 mg by mouth 2 (two) times daily. 09/28/19   [provider]  risperiDONE (RISPERDAL) 1 MG tablet Take 1 tablet by mouth at bedtime. 09/12/19   [provider]  topiramate (TOPAMAX) 100 MG tablet Take 1 tablet (100 mg total) by mouth 2 (two) times daily. Patient not taking: Reported on 09/22/2021 07/28/15   Elenora GammaBradshaw, Samuel L, MD  venlafaxine [facility](EFFEXOR) 75 MG tablet Take 75 mg by mouth daily.     [provider]  VENTOLIN HFA 108 (90 Base) MCG/ACT inhaler INHALE 2 PUFFS INTO THE LUNGS EVERY 4 HOURS AS NEEDED. Patient taking differently: Inhale 2 puffs into the lungs every 4 (four)  hours as needed for wheezing. 07/13/16   Elenora Gamma, MD      Allergies    Fluoxetine hcl, Prozac [fluoxetine hcl], and Gabapentin    Review of Systems   Review of Systems  Unable to perform ROS: Mental status change    Physical Exam Updated Vital Signs BP (!) 193/106 (BP Location: Right Arm)   Pulse (!) 122   Temp 98.2 F (36.8 C)   Resp 18   Ht 5\' 1"  (1.549 m)   Wt 72.6 kg   SpO2 99%   BMI 30.23 kg/m  Physical Exam Vitals and nursing note reviewed.  Constitutional:      General: She is not in acute  distress.    Appearance: She is not ill-appearing.  HENT:     Head: Normocephalic and atraumatic. No abrasion or contusion.     Mouth/Throat:     Mouth: Mucous membranes are moist.     Pharynx: Oropharynx is clear.  Cardiovascular:     Rate and Rhythm: Regular rhythm. Tachycardia present.     Pulses: Normal pulses.     Heart sounds: Normal heart sounds.  Pulmonary:     Effort: Pulmonary effort is normal. No accessory muscle usage or respiratory distress.     Breath sounds: Normal breath sounds and air entry.  Abdominal:     General: Abdomen is flat. Bowel sounds are normal. There is no distension.     Palpations: Abdomen is soft.     Tenderness: There is no abdominal tenderness.  Musculoskeletal:     Right lower leg: No edema.     Left lower leg: No edema.  Skin:    General: Skin is warm and dry.     Capillary Refill: Capillary refill takes less than 2 seconds.  Neurological:     Mental Status: She is alert. She is disoriented and confused.     GCS: GCS eye subscore is 4. GCS verbal subscore is 4. GCS motor subscore is 6.     Sensory: Sensation is intact.     Motor: Motor function is intact. No weakness.     Gait: Gait is intact.     Comments: Patient is able to ambulate without difficulty, no obvious focal deficit, motor and sensation are intact  Psychiatric:        Mood and Affect: Mood normal. Affect is tearful.        Speech: Speech normal.        Behavior: Behavior normal.        Cognition and Memory: Cognition is impaired.     ED Results / Procedures / Treatments   Labs (all labs ordered are listed, but only abnormal results are displayed) Labs Reviewed  CBG MONITORING, ED - Abnormal; Notable for the following components:      Result Value   Glucose-Capillary 204 (*)    All other components within normal limits  CBC WITH DIFFERENTIAL/PLATELET  COMPREHENSIVE METABOLIC PANEL  ETHANOL  URINALYSIS, ROUTINE W REFLEX MICROSCOPIC  RAPID URINE DRUG SCREEN, HOSP  PERFORMED  AMMONIA  TSH    EKG None  Radiology DG Chest 2 View  Result Date: 06/18/2022 CLINICAL DATA:  Altered mental status. EXAM: CHEST - 2 VIEW COMPARISON:  10/14/2019 FINDINGS: The heart size and mediastinal contours are stable. There is atherosclerotic calcification of the aorta. Mild airspace disease is present at the right lung base. No effusion or pneumothorax. Degenerative changes are present in the thoracolumbar spine. No acute osseous abnormality. IMPRESSION: Mild patchy airspace  disease at the right lung base, possible developing pneumonia. Electronically Signed   By: Thornell Sartorius M.D.   On: 06/18/2022 22:59   CT Head Wo Contrast  Result Date: 06/18/2022 CLINICAL DATA:  Mental status change, unknown cause. EXAM: CT HEAD WITHOUT CONTRAST TECHNIQUE: Contiguous axial images were obtained from the base of the skull through the vertex without intravenous contrast. RADIATION DOSE REDUCTION: This exam was performed according to the departmental dose-optimization program which includes automated exposure control, adjustment of the mA and/or kV according to patient size and/or use of iterative reconstruction technique. COMPARISON:  09/22/2021. FINDINGS: Brain: No acute intracranial hemorrhage, midline shift or mass effect. No extra-axial fluid collection. Diffuse atrophy is noted. Periventricular white matter hypodensities are present bilaterally. No hydrocephalus. Vascular: No hyperdense vessel or unexpected calcification. Skull: No acute fracture. Sinuses/Orbits: Mucosal thickening in the ethmoid air cells, sphenoid sinuses, maxillary sinus on the left. No acute orbital abnormality. Other: None. IMPRESSION: No acute intracranial process. Electronically Signed   By: Thornell Sartorius M.D.   On: 06/18/2022 22:58    Procedures Procedures    Medications Ordered in ED Medications  thiamine (VITAMIN B1) tablet 100 mg (has no administration in time range)  sodium chloride 0.9 % bolus 1,000 mL  (has no administration in time range)    ED Course/ Medical Decision Making/ A&P                           Medical Decision Making Amount and/or Complexity of Data Reviewed Labs: ordered. Radiology: ordered.   This patient presents to the ED with chief complaint(s) of acute altered mental status with pertinent past medical history of polysubstance abuse, type 2 DM, alcoholism in remission, bipolar 1 disorder, MDD, HTN.  The complaint involves an extensive differential diagnosis and also carries with it a high risk of complications and morbidity.    The differential diagnosis includes acute toxic-metabolic encephalopathy, hypoglycemia, hyperglycemia, electrolyte abnormalities, hepatic encephalopathy, uremia, hypertensive encephalopathy, alcohol or drug ingestion, CVA, SAH, acute psychosis   The initial plan is to obtain labs including CBC, CMP, ethanol, ammonia, TSH, UA, UDS due to altered mental status; will also obtain CT head, CXR and ECG   Additional history obtained: Additional history obtained from EMS  Records reviewed  ED visit with [facility] on 06/13/22; patient had diarrhea as a complaint, did not appear to have altered mental status at that time  Initial Assessment:   On exam, patient is sitting in room and does not appear to be in acute distress.  Patient is only oriented to self, not able to answer other questions and becomes very tearful and frustrated because she "cannot remember".  Patient appears to be aware of her confusion and states this is not normal for her.  Lungs clear to auscultation bilaterally.  She is hypertensive and tachycardic.  Normal heart sounds.  Skin warm and dry.  Normocephalic, atraumatic.  No obvious injuries.  Patient able to follow commands, motor and sensation intact, patient able to ambulate without assistance.   Independent ECG/labs interpretation:  The following labs were independently interpreted:  ECG reveals sinus tachycardia  without ectopy, ischemia, or infarction.  CBG is 204.  CBC reveals no leukocytosis, anemia, or abnormal platelet count.  All other lab results are still pending.   Independent visualization and interpretation of imaging: I independently visualized the following imaging with scope of interpretation limited to determining acute life threatening conditions related to emergency care: CT  head, which revealed no evidence of acute hemorrhage, mass, or midline shift; no skull fracture.   CXR reveals mild patchy opacities in right lung base, may be developing pneumonia. No effusion or pneumothorax. Heart is normal size.    Disposition:   11:50 PM Care transferred to [facility] and Dr. Nicanor Alcon at the end of my shift as the patient will require reassessment once labs/imaging have resulted. Patient presentation, ED course, and plan of care discussed with review of all pertinent labs and imaging. Please see his/her note for further details regarding further ED course and disposition. Plan at time of handoff is await lab results to determine if obvious cause of AMS, may need hospital admission depending on what lab workup shows. This may be altered or completely changed at the discretion of the oncoming team pending results of further workup.           Final Clinical Impression(s) / ED Diagnoses Final diagnoses:  Altered mental status, unspecified altered mental status type    Rx / DC Orders ED Discharge Orders     None         Lenard Simmer, [place] 06/18/22 2350    Tegeler, Canary Brim, MD 06/23/22 1504

## 2022-06-19 DIAGNOSIS — Z8249 Family history of ischemic heart disease and other diseases of the circulatory system: Secondary | ICD-10-CM | POA: Diagnosis not present

## 2022-06-19 DIAGNOSIS — E1159 Type 2 diabetes mellitus with other circulatory complications: Secondary | ICD-10-CM | POA: Diagnosis not present

## 2022-06-19 DIAGNOSIS — R5381 Other malaise: Secondary | ICD-10-CM

## 2022-06-19 DIAGNOSIS — N390 Urinary tract infection, site not specified: Secondary | ICD-10-CM | POA: Diagnosis present

## 2022-06-19 DIAGNOSIS — Z90710 Acquired absence of both cervix and uterus: Secondary | ICD-10-CM | POA: Diagnosis not present

## 2022-06-19 DIAGNOSIS — Z7951 Long term (current) use of inhaled steroids: Secondary | ICD-10-CM | POA: Diagnosis not present

## 2022-06-19 DIAGNOSIS — E11649 Type 2 diabetes mellitus with hypoglycemia without coma: Secondary | ICD-10-CM | POA: Diagnosis not present

## 2022-06-19 DIAGNOSIS — F1410 Cocaine abuse, uncomplicated: Secondary | ICD-10-CM | POA: Diagnosis present

## 2022-06-19 DIAGNOSIS — Z79899 Other long term (current) drug therapy: Secondary | ICD-10-CM | POA: Diagnosis not present

## 2022-06-19 DIAGNOSIS — N3000 Acute cystitis without hematuria: Secondary | ICD-10-CM

## 2022-06-19 DIAGNOSIS — E669 Obesity, unspecified: Secondary | ICD-10-CM | POA: Diagnosis present

## 2022-06-19 DIAGNOSIS — E1165 Type 2 diabetes mellitus with hyperglycemia: Secondary | ICD-10-CM | POA: Diagnosis present

## 2022-06-19 DIAGNOSIS — Z833 Family history of diabetes mellitus: Secondary | ICD-10-CM | POA: Diagnosis not present

## 2022-06-19 DIAGNOSIS — J1108 Influenza due to unidentified influenza virus with specified pneumonia: Secondary | ICD-10-CM | POA: Diagnosis present

## 2022-06-19 DIAGNOSIS — J1282 Pneumonia due to coronavirus disease 2019: Secondary | ICD-10-CM | POA: Diagnosis present

## 2022-06-19 DIAGNOSIS — J189 Pneumonia, unspecified organism: Secondary | ICD-10-CM

## 2022-06-19 DIAGNOSIS — F1010 Alcohol abuse, uncomplicated: Secondary | ICD-10-CM | POA: Diagnosis present

## 2022-06-19 DIAGNOSIS — Z825 Family history of asthma and other chronic lower respiratory diseases: Secondary | ICD-10-CM | POA: Diagnosis not present

## 2022-06-19 DIAGNOSIS — U071 COVID-19: Secondary | ICD-10-CM | POA: Diagnosis present

## 2022-06-19 DIAGNOSIS — J440 Chronic obstructive pulmonary disease with acute lower respiratory infection: Secondary | ICD-10-CM | POA: Diagnosis present

## 2022-06-19 DIAGNOSIS — E1169 Type 2 diabetes mellitus with other specified complication: Secondary | ICD-10-CM | POA: Diagnosis present

## 2022-06-19 DIAGNOSIS — Z794 Long term (current) use of insulin: Secondary | ICD-10-CM | POA: Diagnosis not present

## 2022-06-19 DIAGNOSIS — Z7985 Long-term (current) use of injectable non-insulin antidiabetic drugs: Secondary | ICD-10-CM | POA: Diagnosis not present

## 2022-06-19 DIAGNOSIS — K219 Gastro-esophageal reflux disease without esophagitis: Secondary | ICD-10-CM | POA: Diagnosis present

## 2022-06-19 DIAGNOSIS — F3132 Bipolar disorder, current episode depressed, moderate: Secondary | ICD-10-CM | POA: Diagnosis present

## 2022-06-19 DIAGNOSIS — G9341 Metabolic encephalopathy: Secondary | ICD-10-CM | POA: Diagnosis present

## 2022-06-19 DIAGNOSIS — R4182 Altered mental status, unspecified: Secondary | ICD-10-CM | POA: Diagnosis present

## 2022-06-19 DIAGNOSIS — I152 Hypertension secondary to endocrine disorders: Secondary | ICD-10-CM | POA: Diagnosis present

## 2022-06-19 DIAGNOSIS — E785 Hyperlipidemia, unspecified: Secondary | ICD-10-CM | POA: Diagnosis present

## 2022-06-19 LAB — RESPIRATORY PANEL BY PCR

## 2022-06-19 LAB — D-DIMER, QUANTITATIVE: D-Dimer, Quant: 0.5 ug/mL-FEU (ref 0.00–0.50)

## 2022-06-19 LAB — URINALYSIS, ROUTINE W REFLEX MICROSCOPIC
Bilirubin Urine: NEGATIVE
Glucose, UA: NEGATIVE mg/dL
Hgb urine dipstick: NEGATIVE
Ketones, ur: NEGATIVE mg/dL
Nitrite: NEGATIVE
Protein, ur: 30 mg/dL — AB
Specific Gravity, Urine: 1.024 (ref 1.005–1.030)
WBC, UA: >50 WBC/hpf — ABNORMAL HIGH (ref 0–5)
pH: 5 (ref 5.0–8.0)

## 2022-06-19 LAB — SARS CORONAVIRUS 2 BY RT PCR: SARS Coronavirus 2 by RT PCR: POSITIVE — AB

## 2022-06-19 LAB — RAPID URINE DRUG SCREEN, HOSP PERFORMED
Amphetamines: NOT DETECTED
Barbiturates: NOT DETECTED
Benzodiazepines: NOT DETECTED
Cocaine: NOT DETECTED
Opiates: NOT DETECTED
Tetrahydrocannabinol: NOT DETECTED

## 2022-06-19 LAB — CBG MONITORING, ED
Glucose-Capillary: 69 mg/dL — ABNORMAL LOW (ref 70–99)
Glucose-Capillary: 212 mg/dL — ABNORMAL HIGH (ref 70–99)

## 2022-06-19 LAB — GLUCOSE, CAPILLARY
Glucose-Capillary: 61 mg/dL — ABNORMAL LOW (ref 70–99)
Glucose-Capillary: 187 mg/dL — ABNORMAL HIGH (ref 70–99)
Glucose-Capillary: 115 mg/dL — ABNORMAL HIGH (ref 70–99)

## 2022-06-19 LAB — FOLATE: Folate: >40 ng/mL (ref >5.9)

## 2022-06-19 LAB — LACTATE DEHYDROGENASE: LDH: 143 U/L (ref 98–192)

## 2022-06-19 LAB — TSH: TSH: 2.747 u[IU]/mL (ref 0.350–4.500)

## 2022-06-19 LAB — HIV ANTIBODY (ROUTINE TESTING W REFLEX): HIV Screen 4th Generation wRfx: NONREACTIVE

## 2022-06-19 LAB — C-REACTIVE PROTEIN: CRP: 1.3 mg/dL — ABNORMAL HIGH (ref <1.0)

## 2022-06-19 LAB — VITAMIN B12: Vitamin B-12: 383 pg/mL (ref 180–914)

## 2022-06-19 LAB — STREP PNEUMONIAE URINARY ANTIGEN: Strep Pneumo Urinary Antigen: NEGATIVE

## 2022-06-19 LAB — PROCALCITONIN: Procalcitonin: <0.1 ng/mL

## 2022-06-19 MED ORDER — DORZOLAMIDE HCL-TIMOLOL MAL 2-0.5 % OP SOLN
1.0000 [drp] | Freq: Two times a day (BID) | OPHTHALMIC | Status: DC
  Administered 2022-06-20 – 2022-06-30 (×17): 1 [drp] via OPHTHALMIC
  Filled 2022-06-19: qty 10

## 2022-06-19 MED ORDER — LORAZEPAM 2 MG/ML IJ SOLN
1.0000 mg | INTRAMUSCULAR | Status: AC | PRN
  Administered 2022-06-19 – 2022-06-20 (×4): 2 mg via INTRAVENOUS
  Administered 2022-06-21: 1 mg via INTRAVENOUS
  Filled 2022-06-19 (×5): qty 1

## 2022-06-19 MED ORDER — THIAMINE HCL 100 MG/ML IJ SOLN
100.0000 mg | Freq: Every day | INTRAMUSCULAR | Status: DC
  Filled 2022-06-19 (×4): qty 2

## 2022-06-19 MED ORDER — SODIUM CHLORIDE 0.9 % IV SOLN
1.0000 g | INTRAVENOUS | Status: DC
  Administered 2022-06-20 – 2022-06-21 (×2): 1 g via INTRAVENOUS
  Filled 2022-06-19 (×2): qty 10

## 2022-06-19 MED ORDER — CEFTRIAXONE SODIUM 1 G IJ SOLR
1.0000 g | Freq: Once | INTRAMUSCULAR | Status: AC
  Administered 2022-06-19: 1 g via INTRAVENOUS
  Filled 2022-06-19: qty 10

## 2022-06-19 MED ORDER — NIRMATRELVIR/RITONAVIR (PAXLOVID)TABLET
3.0000 | ORAL_TABLET | Freq: Two times a day (BID) | ORAL | Status: DC
  Administered 2022-06-19: 3 via ORAL
  Filled 2022-06-19: qty 30

## 2022-06-19 MED ORDER — DEXTROSE 50 % IV SOLN
12.5000 g | INTRAVENOUS | Status: AC
  Administered 2022-06-19: 12.5 g via INTRAVENOUS
  Filled 2022-06-19: qty 50

## 2022-06-19 MED ORDER — ADULT MULTIVITAMIN W/MINERALS CH
1.0000 | ORAL_TABLET | Freq: Every day | ORAL | Status: DC
  Administered 2022-06-19 – 2022-06-30 (×12): 1 via ORAL
  Filled 2022-06-19 (×12): qty 1

## 2022-06-19 MED ORDER — ACETAMINOPHEN 325 MG PO TABS
650.0000 mg | ORAL_TABLET | Freq: Four times a day (QID) | ORAL | Status: DC | PRN
  Administered 2022-06-21 – 2022-06-30 (×13): 650 mg via ORAL
  Filled 2022-06-19 (×13): qty 2

## 2022-06-19 MED ORDER — ARIPIPRAZOLE 5 MG PO TABS: 5.0000 mg | ORAL_TABLET | Freq: Every day | ORAL | Status: DC

## 2022-06-19 MED ORDER — UMECLIDINIUM BROMIDE 62.5 MCG/ACT IN AEPB
1.0000 | INHALATION_SPRAY | Freq: Every day | RESPIRATORY_TRACT | Status: DC
  Administered 2022-06-20 – 2022-06-30 (×8): 1 via RESPIRATORY_TRACT
  Filled 2022-06-19: qty 7

## 2022-06-19 MED ORDER — MOMETASONE FURO-FORMOTEROL FUM 200-5 MCG/ACT IN AERO
2.0000 | INHALATION_SPRAY | Freq: Two times a day (BID) | RESPIRATORY_TRACT | Status: DC
  Administered 2022-06-20 – 2022-06-30 (×13): 2 via RESPIRATORY_TRACT
  Filled 2022-06-19: qty 8.8

## 2022-06-19 MED ORDER — AZITHROMYCIN 250 MG PO TABS: 500.0000 mg | ORAL_TABLET | Freq: Once | ORAL | Status: DC

## 2022-06-19 MED ORDER — VENLAFAXINE HCL ER 75 MG PO CP24
150.0000 mg | ORAL_CAPSULE | Freq: Every day | ORAL | Status: DC
  Administered 2022-06-20 – 2022-06-30 (×11): 150 mg via ORAL
  Filled 2022-06-19 (×11): qty 2

## 2022-06-19 MED ORDER — INSULIN ASPART 100 UNIT/ML IJ SOLN
0.0000 [IU] | Freq: Three times a day (TID) | INTRAMUSCULAR | Status: DC
  Administered 2022-06-19 – 2022-06-20 (×2): 5 [IU] via SUBCUTANEOUS
  Administered 2022-06-21: 11 [IU] via SUBCUTANEOUS
  Administered 2022-06-21: 3 [IU] via SUBCUTANEOUS
  Administered 2022-06-21: 5 [IU] via SUBCUTANEOUS
  Administered 2022-06-22 (×2): 3 [IU] via SUBCUTANEOUS
  Administered 2022-06-22: 8 [IU] via SUBCUTANEOUS
  Administered 2022-06-23 (×2): 3 [IU] via SUBCUTANEOUS
  Administered 2022-06-23: 5 [IU] via SUBCUTANEOUS
  Administered 2022-06-24: 8 [IU] via SUBCUTANEOUS
  Administered 2022-06-24: 3 [IU] via SUBCUTANEOUS
  Administered 2022-06-24 – 2022-06-25 (×3): 2 [IU] via SUBCUTANEOUS
  Administered 2022-06-25: 3 [IU] via SUBCUTANEOUS
  Administered 2022-06-26: 2 [IU] via SUBCUTANEOUS
  Administered 2022-06-26 (×2): 3 [IU] via SUBCUTANEOUS
  Administered 2022-06-27: 2 [IU] via SUBCUTANEOUS
  Administered 2022-06-27 (×2): 3 [IU] via SUBCUTANEOUS
  Administered 2022-06-27: 8 [IU] via SUBCUTANEOUS
  Administered 2022-06-28: 3 [IU] via SUBCUTANEOUS
  Administered 2022-06-28: 15 [IU] via SUBCUTANEOUS
  Administered 2022-06-29 (×2): 3 [IU] via SUBCUTANEOUS
  Administered 2022-06-29: 5 [IU] via SUBCUTANEOUS
  Administered 2022-06-30: 8 [IU] via SUBCUTANEOUS
  Administered 2022-06-30: 3 [IU] via SUBCUTANEOUS
  Filled 2022-06-19: qty 0.15

## 2022-06-19 MED ORDER — LISINOPRIL 20 MG PO TABS
40.0000 mg | ORAL_TABLET | Freq: Every day | ORAL | Status: DC
  Administered 2022-06-19 – 2022-06-30 (×12): 40 mg via ORAL
  Filled 2022-06-19 (×6): qty 2
  Filled 2022-06-19: qty 4
  Filled 2022-06-19 (×6): qty 2

## 2022-06-19 MED ORDER — SODIUM CHLORIDE 0.9 % IV SOLN: 1.0000 g | INTRAVENOUS | Status: DC

## 2022-06-19 MED ORDER — LORAZEPAM 1 MG PO TABS
1.0000 mg | ORAL_TABLET | ORAL | Status: AC | PRN
  Administered 2022-06-19: 1 mg via ORAL
  Filled 2022-06-19: qty 1

## 2022-06-19 MED ORDER — ENOXAPARIN SODIUM 40 MG/0.4ML IJ SOSY
40.0000 mg | PREFILLED_SYRINGE | INTRAMUSCULAR | Status: DC
  Administered 2022-06-19 – 2022-06-29 (×11): 40 mg via SUBCUTANEOUS
  Filled 2022-06-19 (×12): qty 0.4

## 2022-06-19 MED ORDER — FOLIC ACID 1 MG PO TABS
1.0000 mg | ORAL_TABLET | Freq: Every day | ORAL | Status: DC
  Administered 2022-06-19 – 2022-06-30 (×12): 1 mg via ORAL
  Filled 2022-06-19 (×12): qty 1

## 2022-06-19 MED ORDER — LAMOTRIGINE 25 MG PO TABS
250.0000 mg | ORAL_TABLET | Freq: Every day | ORAL | Status: DC
  Administered 2022-06-19 – 2022-06-30 (×12): 250 mg via ORAL
  Filled 2022-06-19 (×13): qty 2

## 2022-06-19 MED ORDER — LATANOPROST 0.005 % OP SOLN
1.0000 [drp] | Freq: Every day | OPHTHALMIC | Status: DC
  Administered 2022-06-21 – 2022-06-29 (×9): 1 [drp] via OPHTHALMIC
  Filled 2022-06-19: qty 2.5

## 2022-06-19 MED ORDER — OXYCODONE HCL 5 MG PO TABS
5.0000 mg | ORAL_TABLET | ORAL | Status: DC | PRN
  Administered 2022-06-20 – 2022-06-29 (×10): 5 mg via ORAL
  Filled 2022-06-19 (×11): qty 1

## 2022-06-19 MED ORDER — THIAMINE MONONITRATE 100 MG PO TABS
100.0000 mg | ORAL_TABLET | Freq: Every day | ORAL | Status: DC
  Administered 2022-06-19 – 2022-06-30 (×12): 100 mg via ORAL
  Filled 2022-06-19 (×12): qty 1

## 2022-06-19 MED ORDER — ACETAMINOPHEN 650 MG RE SUPP: 650.0000 mg | Freq: Four times a day (QID) | RECTAL | Status: DC | PRN

## 2022-06-19 MED ORDER — INSULIN ASPART 100 UNIT/ML IJ SOLN
0.0000 [IU] | Freq: Every day | INTRAMUSCULAR | Status: DC
  Administered 2022-06-21 – 2022-06-26 (×5): 2 [IU] via SUBCUTANEOUS
  Administered 2022-06-27: 3 [IU] via SUBCUTANEOUS
  Administered 2022-06-28 – 2022-06-29 (×2): 2 [IU] via SUBCUTANEOUS
  Filled 2022-06-19: qty 0.05

## 2022-06-19 MED ORDER — SODIUM CHLORIDE 0.9 % IV SOLN
500.0000 mg | INTRAVENOUS | Status: DC
  Administered 2022-06-19: 500 mg via INTRAVENOUS
  Filled 2022-06-19: qty 5

## 2022-06-19 MED ORDER — BRIMONIDINE TARTRATE 0.2 % OP SOLN
1.0000 [drp] | Freq: Three times a day (TID) | OPHTHALMIC | Status: DC
  Administered 2022-06-20 – 2022-06-30 (×25): 1 [drp] via OPHTHALMIC
  Filled 2022-06-19: qty 5

## 2022-06-19 MED ORDER — ARIPIPRAZOLE 5 MG PO TABS
2.5000 mg | ORAL_TABLET | Freq: Every day | ORAL | Status: DC
  Administered 2022-06-19 – 2022-06-22 (×4): 2.5 mg via ORAL
  Filled 2022-06-19 (×5): qty 1

## 2022-06-19 MED ORDER — HALOPERIDOL LACTATE 5 MG/ML IJ SOLN
2.0000 mg | Freq: Four times a day (QID) | INTRAMUSCULAR | Status: DC | PRN
  Administered 2022-06-19 – 2022-06-24 (×4): 2 mg via INTRAVENOUS
  Filled 2022-06-19 (×4): qty 1

## 2022-06-19 NOTE — Progress Notes (Signed)
Patient groaning, unable to verbalize what is wrong. Restless in bed, pulling at lines and not following commands. Reoriented patient to situation and time. Patient appears non-responsive to reorientation. PRN haldol given

## 2022-06-19 NOTE — Progress Notes (Signed)
Hypoglycemic Event  CBG: 61  Treatment: D50 25 mL (12.5 gm)  Symptoms: None  Follow-up CBG: Time:1845 CBG Result:187  Possible Reasons for Event: Inadequate meal intake     Lindsey Melton Lindsey Melton

## 2022-06-19 NOTE — Assessment & Plan Note (Addendum)
-   Given gross confusion, treating for presumed UTI as well - UA noted with large LE, negative nitrite, greater than 50 WBC, rare bacteria -Urine culture growing Gardnerella - d/c rocephin and start flagyl - check urine for GC/CHL also. HIV negative

## 2022-06-19 NOTE — H&P (Signed)
History and Physical    Lindsey Melton BMW:[id number] DOB: June 19, 1951 DOA: 06/18/2022  PCP: Bridget Hartshorn, NP  Patient coming from: Home  Chief Complaint: Altered mental status  HPI: Lindsey Melton is a 71 y.o. female with medical history significant of polysubstance abuse (alcohol, crack cocaine), bipolar disorder, depression, insulin-dependent type 2 diabetes, COPD, hypertension, hyperlipidemia, GERD presented to ED via EMS from home for evaluation of altered mental status/confusion.  Tachycardic on arrival to the ED but afebrile.  Labs showing no leukocytosis, glucose 209, alk phos 160 (slightly elevated on previous labs as well) and no elevation of transaminases and T. bili, blood ethanol level <10, ammonia 37, TSH normal, UDS negative.  UA with large amount of leukocytes and microscopy showing >50 WBCs.  Urine culture pending.  CT head negative for acute finding.  Chest x-ray showing mild patchy airspace disease at the right lung base concerning for possible developing pneumonia.  Patient was not hypoxic in the ED.  She was given ceftriaxone and p.o. thiamine.  Tachycardia resolved after 1 L normal saline bolus.  TRH called to admit.  Patient is very confused and not able to give any history.  Started crying when I asked her orientation questions.  She is able to tell me her name and that she is at [facility]  Not oriented to time.  She is coughing.  Review of Systems:  Review of Systems  Reason unable to perform ROS: AMS.    Past Medical History:  Diagnosis Date   Anxiety    Arthritis    Asthma    Cataract    Cervical myelopathy (HCC)    COPD (chronic obstructive pulmonary disease) (HCC)    Depression    Diabetes mellitus    Headache    History of hiatal hernia    Hyperlipidemia    Hypertension    Kidney stones    Neuromuscular disorder ([place])    Peripheral vertigo    Substance abuse ([place])    Wears glasses     Past Surgical History:  Procedure  Laterality Date   ABDOMINAL HYSTERECTOMY     ANTERIOR CERVICAL DECOMP/DISCECTOMY FUSION N/A 09/10/2015   Procedure: ANTERIOR C3-4 DECOMPRESSION AND FUSION;  Surgeon: Melina Schools, MD;  Location: [place];  Service: Orthopedics;  Laterality: N/A;   APPENDECTOMY     ORIF ANKLE FRACTURE Right 10/07/2020   Procedure: OPEN REDUCTION INTERNAL FIXATION (ORIF) RIGHT ANKLE FRACTURE;  Surgeon: Carole Civil, MD;  Location: [facility];  Service: Orthopedics;  Laterality: Right;   POSTERIOR CERVICAL FUSION/FORAMINOTOMY N/A 09/10/2015   Procedure: POSTERIOR CERVICAL FUSION/FORAMINOTOMY Cervical 3-4;  Surgeon: Melina Schools, MD;  Location: [place];  Service: Orthopedics;  Laterality: N/A;     reports that she has quit smoking. Her smoking use included cigarettes. She has never used smokeless tobacco. She reports current alcohol use of about 10.0 standard drinks of alcohol per week. She reports that she does not currently use drugs after having used the following drugs: "Crack" cocaine.  Allergies  Allergen Reactions   Fluoxetine Hcl Itching and Other (See Comments)    REACTION: Jaundice--shut down liver function REACTION: Jaundice--shut down liver function REACTION: Jaundice--shut down liver function REACTION: Jaundice--shut down liver function REACTION: Jaundice--shut down liver function REACTION: Jaundice--shut down liver function REACTION: Jaundice--shut down liver function    Prozac [Fluoxetine Hcl] Itching and Other (See Comments)    REACTION: Jaundice--shut down liver function   Gabapentin Other (See Comments)    Other reaction(s): Dizziness, Other (  See Comments) dizziness     Family History  Problem Relation Age of Onset   Arthritis Mother    Diabetes Mother    Hearing loss Mother    Heart disease Mother    Hypertension Mother    Vision loss Mother    Stroke Mother    Asthma Mother    Heart disease Father    Depression Daughter    Mental illness Daughter    Vision loss Daughter     Anxiety disorder Daughter    Alcohol abuse Son    Drug abuse Son    Anxiety disorder Son     Prior to Admission medications   Medication Sig Start Date End Date Taking? Authorizing Provider  atorvastatin (LIPITOR) 10 MG tablet TAKE ONE TABLET BY MOUTH ONCE DAILY. 08/13/16   Timmothy Euler, MD  brimonidine (ALPHAGAN) 0.2 % ophthalmic solution Place 1 drop into the right eye 3 (three) times daily. 09/24/19   [provider]  dorzolamide-timolol (COSOPT) 22.3-6.8 MG/ML ophthalmic solution Place 1 drop into both eyes 2 (two) times daily. 09/14/19   [provider]  famotidine (PEPCID) 20 MG tablet Take 20 mg by mouth 2 (two) times daily. 09/09/21   [provider]  Fluticasone-Salmeterol (ADVAIR) 250-50 MCG/DOSE AEPB Inhale 1 puff into the lungs 2 (two) times daily.    [provider]  hydrochlorothiazide (HYDRODIURIL) 25 MG tablet Take 25 mg by mouth daily. 09/12/21   [provider]  ibuprofen (ADVIL) 800 MG tablet Take 1 tablet (800 mg total) by mouth every 8 (eight) hours as needed. 10/21/20   Carole Civil, MD  insulin glargine (LANTUS) 100 UNIT/ML injection Inject 70 Units into the skin at bedtime.    [provider]  lamoTRIgine (LAMICTAL) 100 MG tablet Take 100 mg by mouth daily. 09/17/19   [provider]  latanoprost (XALATAN) 0.005 % ophthalmic solution Place 1 drop into both eyes at bedtime. 09/21/19   [provider]  liraglutide 18 MG/3ML SOPN Inject 0.2 mLs (1.2 mg total) into the skin daily. Start with 0.1 mLs (0.6 mg) daily for 1 week, then increase to 0.2 mLs daily Patient not taking: Reported on 09/22/2021 05/06/16   Timmothy Euler, MD  lisinopril (PRINIVIL,ZESTRIL) 20 MG tablet Take 1 tablet (20 mg total) by mouth daily. Patient not taking: Reported on 09/22/2021 05/06/16   Timmothy Euler, MD  lisinopril (ZESTRIL) 40 MG tablet Take 40 mg by mouth daily. 08/26/21   [provider]  meloxicam  (MOBIC) 7.5 MG tablet Take 7.5 mg by mouth 2 (two) times daily. 10/11/19   [provider]  omeprazole (PRILOSEC) 40 MG capsule Take 40 mg by mouth 2 (two) times daily. 09/28/19   [provider]  risperiDONE (RISPERDAL) 1 MG tablet Take 1 tablet by mouth at bedtime. 09/12/19   [provider]  topiramate (TOPAMAX) 100 MG tablet Take 1 tablet (100 mg total) by mouth 2 (two) times daily. Patient not taking: Reported on 09/22/2021 07/28/15   Timmothy Euler, MD  venlafaxine [facility]) 75 MG tablet Take 75 mg by mouth daily.     [provider]  VENTOLIN HFA 108 (90 Base) MCG/ACT inhaler INHALE 2 PUFFS INTO THE LUNGS EVERY 4 HOURS AS NEEDED. Patient taking differently: Inhale 2 puffs into the lungs every 4 (four) hours as needed for wheezing. 07/13/16   Timmothy Euler, MD    Physical Exam: Vitals:   06/18/22 2125 06/19/22 0202  BP: (!) 193/106 Marland Kitchen)  167/96  Pulse: (!) 122 88  Resp: 18 18  Temp: 98.2 F (36.8 C) 97.8 F (36.6 C)  SpO2: 99% 95%  Weight: 72.6 kg   Height: _0  (1.549 m)     Physical Exam Vitals reviewed.  Constitutional:      General: She is not in acute distress. HENT:     Head: Normocephalic and atraumatic.  Eyes:     Extraocular Movements: Extraocular movements intact.  Cardiovascular:     Rate and Rhythm: Normal rate and regular rhythm.     Pulses: Normal pulses.  Pulmonary:     Effort: Pulmonary effort is normal. No respiratory distress.     Breath sounds: No wheezing or rales.  Abdominal:     General: Bowel sounds are normal. There is no distension.     Palpations: Abdomen is soft.     Tenderness: There is no abdominal tenderness.  Musculoskeletal:     Cervical back: Normal range of motion.     Right lower leg: No edema.     Left lower leg: No edema.  Skin:    General: Skin is warm and dry.  Neurological:     General: No focal deficit present.     Mental Status: She is alert.     Cranial Nerves: No cranial nerve  deficit.     Sensory: No sensory deficit.     Motor: No weakness.     Comments:       Labs on Admission: I have personally reviewed following labs and imaging studies  CBC: Recent Labs  Lab 06/18/22 2154  WBC 6.7  NEUTROABS 4.3  HGB 13.7  HCT 40.0  MCV 89.1  PLT 017   Basic Metabolic Panel: Recent Labs  Lab 06/18/22 2154  NA 140  K 3.6  CL 107  CO2 22  GLUCOSE 209*  BUN 19  CREATININE 0.38*  CALCIUM 9.5   GFR: Estimated Creatinine Clearance: 59.6 mL/min (A) (by C-G formula based on SCr of 0.38 mg/dL (L)). Liver Function Tests: Recent Labs  Lab 06/18/22 2154  AST 20  ALT 20  ALKPHOS 160*  BILITOT 0.1*  PROT 7.5  ALBUMIN 3.5   No results for input(s): "LIPASE", "AMYLASE" in the last 168 hours. Recent Labs  Lab 06/18/22 2255  AMMONIA 37*   Coagulation Profile: No results for input(s): "INR", "PROTIME" in the last 168 hours. Cardiac Enzymes: No results for input(s): "CKTOTAL", "CKMB", "CKMBINDEX", "TROPONINI" in the last 168 hours. BNP (last 3 results) No results for input(s): "PROBNP" in the last 8760 hours. HbA1C: No results for input(s): "HGBA1C" in the last 72 hours. CBG: Recent Labs  Lab 06/18/22 2259  GLUCAP 204*   Lipid Profile: No results for input(s): "CHOL", "HDL", "LDLCALC", "TRIG", "CHOLHDL", "LDLDIRECT" in the last 72 hours. Thyroid Function Tests: Recent Labs    06/18/22 2255  TSH 2.747   Anemia Panel: No results for input(s): "VITAMINB12", "FOLATE", "FERRITIN", "TIBC", "IRON", "RETICCTPCT" in the last 72 hours. Urine analysis:    Component Value Date/Time   COLORURINE YELLOW 06/19/2022 0042   APPEARANCEUR HAZY (A) 06/19/2022 0042   LABSPEC 1.024 06/19/2022 0042   PHURINE 5.0 06/19/2022 0042   GLUCOSEU NEGATIVE 06/19/2022 0042   HGBUR NEGATIVE 06/19/2022 0042   BILIRUBINUR NEGATIVE 06/19/2022 0042   BILIRUBINUR negative 08/18/2015 0842   KETONESUR NEGATIVE 06/19/2022 0042   PROTEINUR 30 (A) 06/19/2022 0042    UROBILINOGEN negative 08/18/2015 0842   UROBILINOGEN 0.2 09/13/2011 1456   NITRITE NEGATIVE 06/19/2022 0042  LEUKOCYTESUR LARGE (A) 06/19/2022 0042    Radiological Exams on Admission: DG Chest 2 View  Result Date: 06/18/2022 CLINICAL DATA:  Altered mental status. EXAM: CHEST - 2 VIEW COMPARISON:  10/14/2019 FINDINGS: The heart size and mediastinal contours are stable. There is atherosclerotic calcification of the aorta. Mild airspace disease is present at the right lung base. No effusion or pneumothorax. Degenerative changes are present in the thoracolumbar spine. No acute osseous abnormality. IMPRESSION: Mild patchy airspace disease at the right lung base, possible developing pneumonia. Electronically Signed   By: Brett Fairy M.D.   On: 06/18/2022 22:59   CT Head Wo Contrast  Result Date: 06/18/2022 CLINICAL DATA:  Mental status change, unknown cause. EXAM: CT HEAD WITHOUT CONTRAST TECHNIQUE: Contiguous axial images were obtained from the base of the skull through the vertex without intravenous contrast. RADIATION DOSE REDUCTION: This exam was performed according to the departmental dose-optimization program which includes automated exposure control, adjustment of the mA and/or kV according to patient size and/or use of iterative reconstruction technique. COMPARISON:  09/22/2021. FINDINGS: Brain: No acute intracranial hemorrhage, midline shift or mass effect. No extra-axial fluid collection. Diffuse atrophy is noted. Periventricular white matter hypodensities are present bilaterally. No hydrocephalus. Vascular: No hyperdense vessel or unexpected calcification. Skull: No acute fracture. Sinuses/Orbits: Mucosal thickening in the ethmoid air cells, sphenoid sinuses, maxillary sinus on the left. No acute orbital abnormality. Other: None. IMPRESSION: No acute intracranial process. Electronically Signed   By: Brett Fairy M.D.   On: 06/18/2022 22:58    EKG: Independently reviewed.  Sinus tachycardia,  baseline wander.  Assessment and Plan  Acute metabolic encephalopathy Likely due to UTI and pneumonia.  CT head negative for acute finding and no focal neurodeficit on exam.  UDS is negative.  No significant elevation of ammonia.  Blood ethanol level undetectable.  TSH is normal. ?Wernicke's encephalopathy given history of alcohol abuse but has no oculomotor dysfunction on exam and no gait ataxia reported. -Continue antibiotics and monitor closely  Community-acquired pneumonia Patient is coughing.  Chest x-ray showing mild patchy airspace disease at the right lung base concerning for possible developing pneumonia.  Patient is not hypoxic.  No signs of sepsis. -Ceftriaxone and azithromycin -Strep pneumo and Legionella urinary antigens -Test for COVID and influenza -Continuous pulse ox, supplemental oxygen as needed  Possible UTI UA with large amount of leukocytes and microscopy showing >50 WBCs.  No signs of sepsis. -Ceftriaxone -Urine culture pending  History of alcohol abuse No signs of withdrawal at this time. -CIWA protocol; Ativan as needed -Thiamine, folate, multivitamin  Insulin-dependent type 2 diabetes A1c 8.1 in March 2022. -Repeat A1c -Moderate sliding scale insulin ACHS -Continue home basal insulin after pharmacy med rec is done.  Hypertension Hyperlipidemia GERD Bipolar disorder, depression COPD: No signs of acute exacerbation. -Pharmacy med rec pending.  DVT prophylaxis: Lovenox Code Status: Full Code Family Communication: No family available at this time. Level of care: Telemetry bed Admission status: It is my clinical opinion that referral for OBSERVATION is reasonable and necessary in this patient based on the above information provided. The aforementioned taken together are felt to place the patient at high risk for further clinical deterioration. However, it is anticipated that the patient may be medically stable for discharge from the hospital within 24 to  48 hours.   Shela Leff MD [facility]  If 7PM-7AM, please contact night-coverage [email or web address]  06/19/2022, 2:17 AM

## 2022-06-19 NOTE — Assessment & Plan Note (Addendum)
-   CXR noted with patchy interstitial opacities, more prominent in the right lung base - Etiology likely viral given COVID-positive and parainfluenza positive -Unfortunately, patient has refused most of her Paxlovid doses; clinically she is stable; hold off on transitioning to remdesivir and will monitor clinically for now off of treatment - PCT negative, d/c azithro -Currently on room air, hold off on steroids

## 2022-06-19 NOTE — Progress Notes (Signed)
Progress Note    Mart Piggsancy H Pratt   [id number]  DOB: 08-17-1950  DOA: 06/18/2022     0 PCP: Rebecka ApleyHemberg, Katherine V, NP  Initial CC: AMS  Hospital Course: Ms. Roena Maladyulliam is a 71 yo female with PMH depression/anxiety, arthritis, DM II, COPD, HLD, HTN, substance use (alcohol, crack cocaine), bipolar.  She presented with altered mentation. She underwent workup on admission. CT head notable for diffuse atrophy otherwise no acute abnormalities.  CXR showed mild patchy airspace disease notably at the right lung base. Lab work relatively unremarkable. UA was noted with large LE, negative nitrite, greater than 50 WBC, rare bacteria. Respiratory swabs were positive for COVID and parainfluenza.  She was started on Paxlovid and admitted for further monitoring.  Interval History:  Seen in the ER this morning. She could not communicate effectively with me or answer questions. Was able to tell me she was hungry. Does not appear to be in distress however.  Tried explaining to her that we are treated for viral PNA and UTI.  Also tried calling Gavin PoundDeborah (daughter) but no answer, will try again.   Assessment and Plan: * Acute metabolic encephalopathy - Continue treatment for UTI and pneumonias; per daughter, patient also may take in excessive alcohol, monitor for any signs of alcohol withdrawal  Pneumonia due to Covid and Parainfluenza infections - CXR noted with patchy interstitial opacities, more prominent in the right lung base - Etiology likely viral given COVID-positive and parainfluenza positive - start paxlovid - PCT negative, d/c azithro -Currently on room air, hold off on steroids  Physical deconditioning - PT/OT eval  UTI (urinary tract infection) - Given gross confusion, treating for presumed UTI as well - UA noted with large LE, negative nitrite, greater than 50 WBC, rare bacteria - Continue Rocephin - Follow-up urine culture  Alcohol abuse - continue CIWA -Ethanol negative on  admission  Bipolar 1 disorder, depressed, moderate (HCC) - Continue Abilify, Lamictal, Effexor  Hypertension associated with diabetes (HCC) - Continue lisinopril.  Hold HCTZ  HLD (hyperlipidemia) - Hold statin for now  Type 2 diabetes mellitus (HCC) - Continue SSI and CBG monitoring   Old records reviewed in assessment of this patient  Antimicrobials: Paxlovid 06/19/22 >> current  DVT prophylaxis:  enoxaparin (LOVENOX) injection 40 mg Start: 06/19/22 1000   Code Status:   Code Status: Full Code  Mobility Assessment (last 72 hours)     Mobility Assessment   No documentation.           Barriers to discharge:  Disposition Plan:  Pending PT eval Status is: Obs  Objective: Blood pressure (!) 147/90, pulse 78, temperature 97.8 F (36.6 C), temperature source Oral, resp. rate 20, height 5\' 1"  (1.549 m), weight 72.6 kg, SpO2 99 %.  Examination:  Physical Exam Constitutional:      Comments: Grossly confused elderly woman lying in bed in no distress  HENT:     Head: Normocephalic and atraumatic.     Mouth/Throat:     Mouth: Mucous membranes are moist.  Eyes:     Extraocular Movements: Extraocular movements intact.  Cardiovascular:     Rate and Rhythm: Normal rate and regular rhythm.  Pulmonary:     Effort: Pulmonary effort is normal.     Comments: Coarse breath sounds bilaterally, no wheezing Abdominal:     General: Bowel sounds are normal. There is no distension.     Palpations: Abdomen is soft.     Tenderness: There is no abdominal tenderness.  Musculoskeletal:  General: Normal range of motion.     Cervical back: Normal range of motion.  Skin:    General: Skin is warm and dry.  Neurological:     Comments: Follows some commands.  No obvious focal deficits.  Encephalopathy precludes full exam      Consultants:    Procedures:    Data Reviewed: Results for orders placed or performed during the hospital encounter of 06/18/22 (from the past 24  hour(s))  CBC with Differential     Status: None   Collection Time: 06/18/22  9:54 PM  Result Value Ref Range   WBC 6.7 4.0 - 10.5 K/uL   RBC 4.49 3.87 - 5.11 MIL/uL   Hemoglobin 13.7 12.0 - 15.0 g/dL   HCT 16.3 84.6 - 65.9 %   MCV 89.1 80.0 - 100.0 fL   MCH 30.5 26.0 - 34.0 pg   MCHC 34.3 30.0 - 36.0 g/dL   RDW 93.5 70.1 - 77.9 %   Platelets 377 150 - 400 K/uL   nRBC 0.0 0.0 - 0.2 %   Neutrophils Relative % 64 %   Neutro Abs 4.3 1.7 - 7.7 K/uL   Lymphocytes Relative 22 %   Lymphs Abs 1.5 0.7 - 4.0 K/uL   Monocytes Relative 9 %   Monocytes Absolute 0.6 0.1 - 1.0 K/uL   Eosinophils Relative 4 %   Eosinophils Absolute 0.3 0.0 - 0.5 K/uL   Basophils Relative 0 %   Basophils Absolute 0.0 0.0 - 0.1 K/uL   Immature Granulocytes 1 %   Abs Immature Granulocytes 0.04 0.00 - 0.07 K/uL  Comprehensive metabolic panel     Status: Abnormal   Collection Time: 06/18/22  9:54 PM  Result Value Ref Range   Sodium 140 135 - 145 mmol/L   Potassium 3.6 3.5 - 5.1 mmol/L   Chloride 107 98 - 111 mmol/L   CO2 22 22 - 32 mmol/L   Glucose, Bld 209 (H) 70 - 99 mg/dL   BUN 19 8 - 23 mg/dL   Creatinine, Ser 3.90 (L) 0.44 - 1.00 mg/dL   Calcium 9.5 8.9 - 30.0 mg/dL   Total Protein 7.5 6.5 - 8.1 g/dL   Albumin 3.5 3.5 - 5.0 g/dL   AST 20 15 - 41 U/L   ALT 20 0 - 44 U/L   Alkaline Phosphatase 160 (H) 38 - 126 U/L   Total Bilirubin 0.1 (L) 0.3 - 1.2 mg/dL   GFR, Estimated >92 >33 mL/min   Anion gap 11 5 - 15  Ethanol     Status: None   Collection Time: 06/18/22  9:54 PM  Result Value Ref Range   Alcohol, Ethyl (B) <10 <10 mg/dL  Ammonia     Status: Abnormal   Collection Time: 06/18/22 10:55 PM  Result Value Ref Range   Ammonia 37 (H) 9 - 35 umol/L  TSH     Status: None   Collection Time: 06/18/22 10:55 PM  Result Value Ref Range   TSH 2.747 0.350 - 4.500 uIU/mL  POC CBG, ED     Status: Abnormal   Collection Time: 06/18/22 10:59 PM  Result Value Ref Range   Glucose-Capillary 204 (H) 70 - 99  mg/dL  Urinalysis, Routine w reflex microscopic Urine, Clean Catch     Status: Abnormal   Collection Time: 06/19/22 12:42 AM  Result Value Ref Range   Color, Urine YELLOW YELLOW   APPearance HAZY (A) CLEAR   Specific Gravity, Urine 1.024 1.005 - 1.030  pH 5.0 5.0 - 8.0   Glucose, UA NEGATIVE NEGATIVE mg/dL   Hgb urine dipstick NEGATIVE NEGATIVE   Bilirubin Urine NEGATIVE NEGATIVE   Ketones, ur NEGATIVE NEGATIVE mg/dL   Protein, ur 30 (A) NEGATIVE mg/dL   Nitrite NEGATIVE NEGATIVE   Leukocytes,Ua LARGE (A) NEGATIVE   RBC / HPF 6-10 0 - 5 RBC/hpf   WBC, UA >50 (H) 0 - 5 WBC/hpf   Bacteria, UA RARE (A) NONE SEEN   Squamous Epithelial / LPF 6-10 0 - 5   Mucus PRESENT   Rapid urine drug screen (hospital performed)     Status: None   Collection Time: 06/19/22 12:42 AM  Result Value Ref Range   Opiates NONE DETECTED NONE DETECTED   Cocaine NONE DETECTED NONE DETECTED   Benzodiazepines NONE DETECTED NONE DETECTED   Amphetamines NONE DETECTED NONE DETECTED   Tetrahydrocannabinol NONE DETECTED NONE DETECTED   Barbiturates NONE DETECTED NONE DETECTED  Respiratory (~20 pathogens) panel by PCR     Status: Abnormal   Collection Time: 06/19/22  2:53 AM   Specimen: Nasopharyngeal Swab; Respiratory  Result Value Ref Range   Adenovirus NOT DETECTED NOT DETECTED   Coronavirus 229E NOT DETECTED NOT DETECTED   Coronavirus HKU1 NOT DETECTED NOT DETECTED   Coronavirus NL63 NOT DETECTED NOT DETECTED   Coronavirus OC43 NOT DETECTED NOT DETECTED   Metapneumovirus NOT DETECTED NOT DETECTED   Rhinovirus / Enterovirus NOT DETECTED NOT DETECTED   Influenza A NOT DETECTED NOT DETECTED   Influenza B NOT DETECTED NOT DETECTED   Parainfluenza Virus 1 NOT DETECTED NOT DETECTED   Parainfluenza Virus 2 DETECTED (A) NOT DETECTED   Parainfluenza Virus 3 NOT DETECTED NOT DETECTED   Parainfluenza Virus 4 NOT DETECTED NOT DETECTED   Respiratory Syncytial Virus NOT DETECTED NOT DETECTED   Bordetella pertussis  NOT DETECTED NOT DETECTED   Bordetella Parapertussis NOT DETECTED NOT DETECTED   Chlamydophila pneumoniae NOT DETECTED NOT DETECTED   Mycoplasma pneumoniae NOT DETECTED NOT DETECTED  SARS Coronavirus 2 by RT PCR (hospital order, performed in [facility] hospital lab) *cepheid single result test* Anterior Nasal Swab     Status: Abnormal   Collection Time: 06/19/22  2:53 AM   Specimen: Anterior Nasal Swab  Result Value Ref Range   SARS Coronavirus 2 by RT PCR POSITIVE (A) NEGATIVE  HIV Antibody (routine testing w rflx)     Status: None   Collection Time: 06/19/22  4:17 AM  Result Value Ref Range   HIV Screen 4th Generation wRfx Non Reactive Non Reactive  Procalcitonin - Baseline     Status: None   Collection Time: 06/19/22  4:17 AM  Result Value Ref Range   Procalcitonin <0.10 ng/mL  CBG monitoring, ED     Status: Abnormal   Collection Time: 06/19/22  8:04 AM  Result Value Ref Range   Glucose-Capillary 69 (L) 70 - 99 mg/dL  CBG monitoring, ED     Status: Abnormal   Collection Time: 06/19/22 12:14 PM  Result Value Ref Range   Glucose-Capillary 212 (H) 70 - 99 mg/dL    I have Reviewed nursing notes, Vitals, and Lab results since pt's last encounter. Pertinent lab results : see above I have ordered test including BMP, CBC, Mg I have reviewed the last note from staff over past 24 hours I have discussed pt's care plan and test results with nursing staff, case manager  Time spent: Greater than 50% of the 55 minute visit was spent in counseling/coordination of  care for the patient as laid out in the A&P.    LOS: 0 days   Lewie Chamber, MD [facility] 06/19/2022, 12:30 PM

## 2022-06-19 NOTE — Discharge Instructions (Signed)

## 2022-06-19 NOTE — Assessment & Plan Note (Signed)
-   Continue SSI and CBG monitoring ?

## 2022-06-19 NOTE — Assessment & Plan Note (Signed)
-   continue CIWA -Ethanol negative on admission

## 2022-06-19 NOTE — Assessment & Plan Note (Signed)
-   Continue lisinopril.  Hold HCTZ

## 2022-06-19 NOTE — ED Notes (Signed)
Several falls

## 2022-06-19 NOTE — Assessment & Plan Note (Signed)
PT/OT eval ?

## 2022-06-19 NOTE — Assessment & Plan Note (Signed)
-   Continue Abilify, Lamictal, Effexor

## 2022-06-19 NOTE — Hospital Course (Signed)
Lindsey Melton is a 71 yo female with PMH depression/anxiety, arthritis, DM II, COPD, HLD, HTN, substance use (alcohol, crack cocaine), bipolar.  She presented with altered mentation. She underwent workup on admission. CT head notable for diffuse atrophy otherwise no acute abnormalities.  CXR showed mild patchy airspace disease notably at the right lung base. Lab work relatively unremarkable. UA was noted with large LE, negative nitrite, greater than 50 WBC, rare bacteria. Respiratory swabs were positive for COVID and parainfluenza.  She was started on Paxlovid and admitted for further monitoring.

## 2022-06-19 NOTE — ED Provider Notes (Signed)
2:16 AM Patient care assumed from Cartwright, PA-C at shift change.  Patient presenting for altered mental status.  Is alert to self on exam.  She has no focal deficits during her assessment today.  Head CT negative for acute process.  On chart review, her history is notable for alcohol and polysubstance abuse; however, her alcohol and UDS today are negative.  Patient's workup does show pyuria concerning for urinary tract infection.  Chest x-ray questions developing pneumonia.  She has been started on antibiotics and will be admitted for acute encephalopathy.  Case discussed with Dr. Loney Loh of [facility] who will admit.  Vitals:   06/18/22 2125 06/19/22 0202  BP: (!) 193/106 (!) 167/96  Pulse: (!) 122 88  Resp: 18 18  Temp: 98.2 F (36.8 C) 97.8 F (36.6 C)  SpO2: 99% 95%  Weight: 72.6 kg   Height: 5\' 1"  (1.549 m)    .Critical Care  Performed by: , PA-C Authorized by: Antony Madura, PA-C   Critical care provider statement:    Critical care time (minutes):  35   Critical care was necessary to treat or prevent imminent or life-threatening deterioration of the following conditions:  CNS failure or compromise (acute encephalopathy)   Critical care was time spent personally by me on the following activities:  Development of treatment plan with patient or surrogate, discussions with consultants, evaluation of patient's response to treatment, examination of patient, ordering and review of laboratory studies, ordering and review of radiographic studies, ordering and performing treatments and interventions, pulse oximetry, re-evaluation of patient's condition and review of old charts  Results for orders placed or performed during the hospital encounter of 06/18/22  CBC with Differential  Result Value Ref Range   WBC 6.7 4.0 - 10.5 K/uL   RBC 4.49 3.87 - 5.11 MIL/uL   Hemoglobin 13.7 12.0 - 15.0 g/dL   HCT 14/08/23 08.6 - 57.8 %   MCV 89.1 80.0 - 100.0 fL   MCH 30.5 26.0 - 34.0 pg   MCHC 34.3 30.0  - 36.0 g/dL   RDW 46.9 62.9 - 52.8 %   Platelets 377 150 - 400 K/uL   nRBC 0.0 0.0 - 0.2 %   Neutrophils Relative % 64 %   Neutro Abs 4.3 1.7 - 7.7 K/uL   Lymphocytes Relative 22 %   Lymphs Abs 1.5 0.7 - 4.0 K/uL   Monocytes Relative 9 %   Monocytes Absolute 0.6 0.1 - 1.0 K/uL   Eosinophils Relative 4 %   Eosinophils Absolute 0.3 0.0 - 0.5 K/uL   Basophils Relative 0 %   Basophils Absolute 0.0 0.0 - 0.1 K/uL   Immature Granulocytes 1 %   Abs Immature Granulocytes 0.04 0.00 - 0.07 K/uL  Comprehensive metabolic panel  Result Value Ref Range   Sodium 140 135 - 145 mmol/L   Potassium 3.6 3.5 - 5.1 mmol/L   Chloride 107 98 - 111 mmol/L   CO2 22 22 - 32 mmol/L   Glucose, Bld 209 (H) 70 - 99 mg/dL   BUN 19 8 - 23 mg/dL   Creatinine, Ser 41.3 (L) 0.44 - 1.00 mg/dL   Calcium 9.5 8.9 - 2.44 mg/dL   Total Protein 7.5 6.5 - 8.1 g/dL   Albumin 3.5 3.5 - 5.0 g/dL   AST 20 15 - 41 U/L   ALT 20 0 - 44 U/L   Alkaline Phosphatase 160 (H) 38 - 126 U/L   Total Bilirubin 0.1 (L) 0.3 - 1.2 mg/dL  GFR, Estimated >60 >60 mL/min   Anion gap 11 5 - 15  Ethanol  Result Value Ref Range   Alcohol, Ethyl (B) <10 <10 mg/dL  Urinalysis, Routine w reflex microscopic Urine, Clean Catch  Result Value Ref Range   Color, Urine YELLOW YELLOW   APPearance HAZY (A) CLEAR   Specific Gravity, Urine 1.024 1.005 - 1.030   pH 5.0 5.0 - 8.0   Glucose, UA NEGATIVE NEGATIVE mg/dL   Hgb urine dipstick NEGATIVE NEGATIVE   Bilirubin Urine NEGATIVE NEGATIVE   Ketones, ur NEGATIVE NEGATIVE mg/dL   Protein, ur 30 (A) NEGATIVE mg/dL   Nitrite NEGATIVE NEGATIVE   Leukocytes,Ua LARGE (A) NEGATIVE   RBC / HPF 6-10 0 - 5 RBC/hpf   WBC, UA >50 (H) 0 - 5 WBC/hpf   Bacteria, UA RARE (A) NONE SEEN   Squamous Epithelial / LPF 6-10 0 - 5   Mucus PRESENT   Rapid urine drug screen (hospital performed)  Result Value Ref Range   Opiates NONE DETECTED NONE DETECTED   Cocaine NONE DETECTED NONE DETECTED   Benzodiazepines NONE  DETECTED NONE DETECTED   Amphetamines NONE DETECTED NONE DETECTED   Tetrahydrocannabinol NONE DETECTED NONE DETECTED   Barbiturates NONE DETECTED NONE DETECTED  Ammonia  Result Value Ref Range   Ammonia 37 (H) 9 - 35 umol/L  TSH  Result Value Ref Range   TSH 2.747 0.350 - 4.500 uIU/mL  POC CBG, ED  Result Value Ref Range   Glucose-Capillary 204 (H) 70 - 99 mg/dL   DG Chest 2 View  Result Date: 06/18/2022 CLINICAL DATA:  Altered mental status. EXAM: CHEST - 2 VIEW COMPARISON:  10/14/2019 FINDINGS: The heart size and mediastinal contours are stable. There is atherosclerotic calcification of the aorta. Mild airspace disease is present at the right lung base. No effusion or pneumothorax. Degenerative changes are present in the thoracolumbar spine. No acute osseous abnormality. IMPRESSION: Mild patchy airspace disease at the right lung base, possible developing pneumonia. Electronically Signed   By: Brett Fairy M.D.   On: 06/18/2022 22:59   CT Head Wo Contrast  Result Date: 06/18/2022 CLINICAL DATA:  Mental status change, unknown cause. EXAM: CT HEAD WITHOUT CONTRAST TECHNIQUE: Contiguous axial images were obtained from the base of the skull through the vertex without intravenous contrast. RADIATION DOSE REDUCTION: This exam was performed according to the departmental dose-optimization program which includes automated exposure control, adjustment of the mA and/or kV according to patient size and/or use of iterative reconstruction technique. COMPARISON:  09/22/2021. FINDINGS: Brain: No acute intracranial hemorrhage, midline shift or mass effect. No extra-axial fluid collection. Diffuse atrophy is noted. Periventricular white matter hypodensities are present bilaterally. No hydrocephalus. Vascular: No hyperdense vessel or unexpected calcification. Skull: No acute fracture. Sinuses/Orbits: Mucosal thickening in the ethmoid air cells, sphenoid sinuses, maxillary sinus on the left. No acute orbital  abnormality. Other: None. IMPRESSION: No acute intracranial process. Electronically Signed   By: Brett Fairy M.D.   On: 06/18/2022 22:58      Antonietta Breach, PA-C 06/19/22 [place], April, MD 06/19/22 [phone]

## 2022-06-19 NOTE — ED Notes (Signed)
RN spoke with Lindsey Melton patient's daughter for an update on her mother. Daughter stated that her mother "drinks real bad" and her AMS has worsened over the moths, she also stated that her mother lives alone and she believes her mother is not stable enough to return home and is considering placement.

## 2022-06-19 NOTE — Assessment & Plan Note (Signed)
-  Hold statin for now 

## 2022-06-19 NOTE — Assessment & Plan Note (Signed)
-   Continue treatment for UTI and pneumonias; per daughter, patient also may take in excessive alcohol, monitor for any signs of alcohol withdrawal -Per daughter, baseline mentation is also rather poor.  She is able to only answer a few questions here and there but has not been able to carry on conversation for quite some time.  She also has poor attention span and concentration ability

## 2022-06-20 DIAGNOSIS — F1010 Alcohol abuse, uncomplicated: Secondary | ICD-10-CM

## 2022-06-20 DIAGNOSIS — R5381 Other malaise: Secondary | ICD-10-CM | POA: Diagnosis not present

## 2022-06-20 DIAGNOSIS — G9341 Metabolic encephalopathy: Secondary | ICD-10-CM | POA: Diagnosis not present

## 2022-06-20 DIAGNOSIS — J189 Pneumonia, unspecified organism: Secondary | ICD-10-CM | POA: Diagnosis not present

## 2022-06-20 DIAGNOSIS — N3000 Acute cystitis without hematuria: Secondary | ICD-10-CM | POA: Diagnosis not present

## 2022-06-20 LAB — CBC WITH DIFFERENTIAL/PLATELET
Abs Immature Granulocytes: 0.05 10*3/uL (ref 0.00–0.07)
Basophils Absolute: 0 10*3/uL (ref 0.0–0.1)
Basophils Relative: 0 %
Eosinophils Absolute: 0.1 10*3/uL (ref 0.0–0.5)
Eosinophils Relative: 1 %
HCT: 39.1 % (ref 36.0–46.0)
Hemoglobin: 12.6 g/dL (ref 12.0–15.0)
Immature Granulocytes: 1 %
Lymphocytes Relative: 16 %
Lymphs Abs: 1.3 10*3/uL (ref 0.7–4.0)
MCH: 29.6 pg (ref 26.0–34.0)
MCHC: 32.2 g/dL (ref 30.0–36.0)
MCV: 92 fL (ref 80.0–100.0)
Monocytes Absolute: 0.7 10*3/uL (ref 0.1–1.0)
Monocytes Relative: 8 %
Neutro Abs: 6 10*3/uL (ref 1.7–7.7)
Neutrophils Relative %: 74 %
Platelets: 364 10*3/uL (ref 150–400)
RBC: 4.25 MIL/uL (ref 3.87–5.11)
RDW: 12.2 % (ref 11.5–15.5)
WBC: 8.1 10*3/uL (ref 4.0–10.5)
nRBC: 0 % (ref 0.0–0.2)

## 2022-06-20 LAB — COMPREHENSIVE METABOLIC PANEL
ALT: 18 U/L (ref 0–44)
AST: 19 U/L (ref 15–41)
Albumin: 3.5 g/dL (ref 3.5–5.0)
Alkaline Phosphatase: 123 U/L (ref 38–126)
Anion gap: 9 (ref 5–15)
BUN: 9 mg/dL (ref 8–23)
CO2: 28 mmol/L (ref 22–32)
Calcium: 9 mg/dL (ref 8.9–10.3)
Chloride: 106 mmol/L (ref 98–111)
Creatinine, Ser: 0.85 mg/dL (ref 0.44–1.00)
GFR, Estimated: >60 mL/min (ref >60)
Glucose, Bld: 157 mg/dL — ABNORMAL HIGH (ref 70–99)
Potassium: 3.5 mmol/L (ref 3.5–5.1)
Sodium: 143 mmol/L (ref 135–145)
Total Bilirubin: 0.3 mg/dL (ref 0.3–1.2)
Total Protein: 6.5 g/dL (ref 6.5–8.1)

## 2022-06-20 LAB — GLUCOSE, CAPILLARY
Glucose-Capillary: <10 mg/dL — CL (ref 70–99)
Glucose-Capillary: 180 mg/dL — ABNORMAL HIGH (ref 70–99)
Glucose-Capillary: 199 mg/dL — ABNORMAL HIGH (ref 70–99)
Glucose-Capillary: 211 mg/dL — ABNORMAL HIGH (ref 70–99)
Glucose-Capillary: 179 mg/dL — ABNORMAL HIGH (ref 70–99)

## 2022-06-20 LAB — C-REACTIVE PROTEIN: CRP: 4.2 mg/dL — ABNORMAL HIGH (ref <1.0)

## 2022-06-20 LAB — PROCALCITONIN: Procalcitonin: <0.1 ng/mL

## 2022-06-20 LAB — D-DIMER, QUANTITATIVE: D-Dimer, Quant: 0.52 ug/mL-FEU — ABNORMAL HIGH (ref 0.00–0.50)

## 2022-06-20 LAB — LACTATE DEHYDROGENASE: LDH: 174 U/L (ref 98–192)

## 2022-06-20 MED ORDER — VITAMIN B-12 1000 MCG PO TABS
1000.0000 ug | ORAL_TABLET | Freq: Every day | ORAL | Status: DC
  Administered 2022-06-20 – 2022-06-30 (×11): 1000 ug via ORAL
  Filled 2022-06-20 (×11): qty 1

## 2022-06-20 NOTE — Progress Notes (Signed)
Patient 1200 BG check <10. Technician did a recheck and blood sugar was 180. Patient is now up and eating lunch currently.   Smiley Houseman, RN

## 2022-06-20 NOTE — Evaluation (Signed)
Occupational Therapy Evaluation Patient Details Name: Lindsey Melton MRN: [id number] DOB: [birth year] Today's Date: 06/20/2022   History of Present Illness 71 yo female admitted with altered mentation. Pt with acute metabolic encephalopathy, pneumonia due to Covid and parainfluenza infections, UTI. PMH depression/anxiety, arthritis, DM II, COPD, HLD, HTN, substance use (alcohol, crack cocaine), bipolar, dementia (per daughter).  .   Clinical Impression   Called daughter for PLOF. Per daughter, pt lives alone in an apt and frequently visits her deceased husband's wife's house to drink and "most likely do drugs" . Daughter states her mom has been drinking "a lot more" in the last 3 months. At baseline, pt uses a RW for mobility and has had multiple falls. She is able to complete her bathing/dressing however has difficulty with IADL tasks, making medication errors and leaving uncooked food in the oven. Pt lethargic however able to progress OOB with min A to recliner to eat lunch. Overall Mod A with ADL tasks at this time due to deficits listed below. Pt will need rehab at SNF and most likely ALF after DC from SNF. Acute OT to follow.  Note - pt is HOH and visually impaired at baseline     Recommendations for follow up therapy are one component of a multi-disciplinary discharge planning process, led by the attending physician.  Recommendations may be updated based on patient status, additional functional criteria and insurance authorization.   Follow Up Recommendations  Skilled nursing-short term rehab (<3 hours/day)     Assistance Recommended at Discharge Frequent or constant Supervision/Assistance  Patient can return home with the following A little help with walking and/or transfers;A little help with bathing/dressing/bathroom;Assistance with cooking/housework;Direct supervision/assist for medications management;Direct supervision/assist for financial management;Assist for transportation;Help  with stairs or ramp for entrance    Functional Status Assessment  Patient has had a recent decline in their functional status and demonstrates the ability to make significant improvements in function in a reasonable and predictable amount of time.  Equipment Recommendations  None recommended by OT    Recommendations for Other Services       Precautions / Restrictions Precautions Precautions: Fall Precaution Comments: covid Restrictions Weight Bearing Restrictions: No      Mobility Bed Mobility Overal bed mobility: Needs Assistance Bed Mobility: Supine to Sit     Supine to sit: Min assist          Transfers Overall transfer level: Needs assistance Equipment used: Rolling walker (2 wheels) Transfers: Sit to/from Stand, Bed to chair/wheelchair/BSC Sit to Stand: Min assist Stand pivot transfers: Min assist                Balance Overall balance assessment: History of Falls, Needs assistance Sitting-balance support: Feet supported Sitting balance-Leahy Scale: Fair       Standing balance-Leahy Scale: Poor                             ADL either performed or assessed with clinical judgement   ADL Overall ADL's : Needs assistance/impaired Eating/Feeding: Set up;Supervision/ safety   Grooming: Minimal assistance   Upper Body Bathing: Minimal assistance   Lower Body Bathing: Moderate assistance   Upper Body Dressing : Minimal assistance   Lower Body Dressing: Maximal assistance   Toilet Transfer: Minimal assistance;Ambulation   Toileting- Clothing Manipulation and Hygiene: Moderate assistance       Functional mobility during ADLs: Minimal assistance;Rolling walker (2 wheels);Cueing for safety  Vision Baseline Vision/History: 1 Wears glasses Additional Comments: low vision deficits; daughter reports she wears bifocals but "can't see nothing:     Perception     Praxis      Pertinent Vitals/Pain Pain Assessment Pain  Assessment: Faces Faces Pain Scale: Hurts a little bit Pain Descriptors / Indicators:  (generalized) Pain Intervention(s): Limited activity within patient's tolerance     Hand Dominance Right   Extremity/Trunk Assessment Upper Extremity Assessment Upper Extremity Assessment: Generalized weakness   Lower Extremity Assessment Lower Extremity Assessment: Defer to PT evaluation   Cervical / Trunk Assessment Cervical / Trunk Assessment: Normal   Communication Communication Communication: HOH   Cognition Arousal/Alertness: Lethargic Behavior During Therapy: Flat affect, Restless Overall Cognitive Status: Impaired/Different from baseline Area of Impairment: Orientation, Attention, Memory, Following commands, Safety/judgement, Awareness, Problem solving                 Orientation Level: Disoriented to, Place, Time, Situation (unable to say her last name) Current Attention Level: Sustained Memory: Decreased recall of precautions, Decreased short-term memory Following Commands: Follows one step commands with increased time Safety/Judgement: Decreased awareness of safety, Decreased awareness of deficits Awareness: Intellectual Problem Solving: Slow processing, Decreased initiation       General Comments   Pt does not drive. Family takes her over to her deceased husband's wife's house    Exercises     Shoulder Instructions      Home Living Family/patient expects to be discharged to:: Private residence Living Arrangements: Alone Available Help at Discharge: Family;Available PRN/intermittently Type of Home: Apartment       Home Layout: One level     Bathroom Shower/Tub: Chief Strategy Officer: Standard                Prior Functioning/Environment Prior Level of Function : Independent/Modified Independent             Mobility Comments: uses a RW; multiple falls ADLs Comments: able to bath/dress self; difficulty with the cognitive aspect of  IADL tasks; daughter reports she puts itmes in the oven to cook but forgets what she is doing; drops pills/medicaiton errors        OT Problem List: Decreased strength;Decreased activity tolerance;Impaired balance (sitting and/or standing);Impaired vision/perception;Decreased cognition;Decreased safety awareness;Decreased knowledge of use of DME or AE;Cardiopulmonary status limiting activity      OT Treatment/Interventions: Self-care/ADL training;Therapeutic exercise;Energy conservation;DME and/or AE instruction;Therapeutic activities;Cognitive remediation/compensation;Visual/perceptual remediation/compensation;Patient/family education;Balance training    OT Goals(Current goals can be found in the care plan section) Acute Rehab OT Goals Patient Stated Goal: per daughter, rehab OT Goal Formulation: With family Time For Goal Achievement: 07/04/22 Potential to Achieve Goals: Fair  OT Frequency: Min 2X/week    Co-evaluation PT/OT/SLP Co-Evaluation/Treatment: Yes Reason for Co-Treatment: Necessary to address cognition/behavior during functional activity   OT goals addressed during session: ADL's and self-care      AM-PAC OT "6 Clicks" Daily Activity     Outcome Measure Help from another person eating meals?: A Little Help from another person taking care of personal grooming?: A Little Help from another person toileting, which includes using toliet, bedpan, or urinal?: A Lot Help from another person bathing (including washing, rinsing, drying)?: A Lot Help from another person to put on and taking off regular upper body clothing?: A Little Help from another person to put on and taking off regular lower body clothing?: A Lot 6 Click Score: 15   End of Session Equipment Utilized During Treatment: Gait belt;Rolling walker (  2 wheels) Nurse Communication: Mobility status  Activity Tolerance: Patient tolerated treatment well Patient left: in chair;with call bell/phone within reach;with chair  alarm set  OT Visit Diagnosis: Unsteadiness on feet (R26.81);Repeated falls (R29.6);Muscle weakness (generalized) (M62.81);Other symptoms and signs involving cognitive function                Time: 1937-9024 OT Time Calculation (min): 29 min Charges:  OT General Charges $OT Visit: 1 Visit OT Evaluation $OT Eval Moderate Complexity: 1 Mod  Kedric Bumgarner, OT/L   Acute OT Clinical Specialist Acute Rehabilitation Services Pager [phone] Office [phone]   [facility] 06/20/2022, 2:03 PM

## 2022-06-20 NOTE — Progress Notes (Signed)
Patient continues to be restless and pulling at lines. Patient unable to describe discomfort or verbalize needs. Patient readjusted in bed, will continue to monitor.

## 2022-06-20 NOTE — Progress Notes (Signed)
Progress Note    Lindsey Melton   ZOX:[id number]  DOB: October 12, 1950  DOA: 06/18/2022     1 PCP: Rebecka Apley, NP  Initial CC: AMS  Hospital Course: Lindsey Melton is a 71 yo female with PMH depression/anxiety, arthritis, DM II, COPD, HLD, HTN, substance use (alcohol, crack cocaine), bipolar.  She presented with altered mentation. She underwent workup on admission. CT head notable for diffuse atrophy otherwise no acute abnormalities.  CXR showed mild patchy airspace disease notably at the right lung base. Lab work relatively unremarkable. UA was noted with large LE, negative nitrite, greater than 50 WBC, rare bacteria. Respiratory swabs were positive for COVID and parainfluenza.  She was started on Paxlovid and admitted for further monitoring.  Interval History:  No events overnight.  Was able to reach her daughter yesterday and we discussed the patient further.  She has been living alone but does not appear that she can continue this and daughter is trying to help her get placed in long-term care. Patient continues to consume excessive alcohol per her report. Patient also has a hard time caring on much conversation and can only answer basic questions.  Attention span and concentration are poor.  Assessment and Plan: * Acute metabolic encephalopathy - Continue treatment for UTI and pneumonias; per daughter, patient also may take in excessive alcohol, monitor for any signs of alcohol withdrawal -Per daughter, baseline mentation is also rather poor.  She is able to only answer a few questions here and there but has not been able to carry on conversation for quite some time.  She also has poor attention span and concentration ability  Pneumonia due to Covid and Parainfluenza infections - CXR noted with patchy interstitial opacities, more prominent in the right lung base - Etiology likely viral given COVID-positive and parainfluenza positive - start paxlovid - PCT negative, d/c  azithro -Currently on room air, hold off on steroids  Physical deconditioning - PT/OT eval  UTI (urinary tract infection) - Given gross confusion, treating for presumed UTI as well - UA noted with large LE, negative nitrite, greater than 50 WBC, rare bacteria - Continue Rocephin - Follow-up urine culture  Alcohol abuse - continue CIWA -Ethanol negative on admission  Bipolar 1 disorder, depressed, moderate (HCC) - Continue Abilify, Lamictal, Effexor  Hypertension associated with diabetes (HCC) - Continue lisinopril.  Hold HCTZ  HLD (hyperlipidemia) - Hold statin for now  Type 2 diabetes mellitus (HCC) - Continue SSI and CBG monitoring   Old records reviewed in assessment of this patient  Antimicrobials: Paxlovid 06/19/22 >> current  DVT prophylaxis:  enoxaparin (LOVENOX) injection 40 mg Start: 06/19/22 1000   Code Status:   Code Status: Full Code  Mobility Assessment (last 72 hours)     Mobility Assessment   No documentation.           Barriers to discharge:  Disposition Plan:  Pending PT eval Status is: Inpt  Objective: Blood pressure (!) 172/92, pulse 85, temperature 98.3 F (36.8 C), temperature source Axillary, resp. rate 16, height 5\' 1"  (1.549 m), weight 73.6 kg, SpO2 97 %.  Examination:  Physical Exam Constitutional:      Comments: Grossly confused elderly woman lying in bed in no distress  HENT:     Head: Normocephalic and atraumatic.     Mouth/Throat:     Mouth: Mucous membranes are moist.  Eyes:     Extraocular Movements: Extraocular movements intact.  Cardiovascular:     Rate and Rhythm: Normal  rate and regular rhythm.  Pulmonary:     Effort: Pulmonary effort is normal.     Comments: Coarse breath sounds bilaterally, no wheezing Abdominal:     General: Bowel sounds are normal. There is no distension.     Palpations: Abdomen is soft.     Tenderness: There is no abdominal tenderness.  Musculoskeletal:        General: Normal range of  motion.     Cervical back: Normal range of motion.  Skin:    General: Skin is warm and dry.  Neurological:     Comments: Follows some commands.  No obvious focal deficits.  Encephalopathy precludes full exam      Consultants:    Procedures:    Data Reviewed: Results for orders placed or performed during the hospital encounter of 06/18/22 (from the past 24 hour(s))  Vitamin B12     Status: None   Collection Time: 06/19/22  2:36 PM  Result Value Ref Range   Vitamin B-12 383 180 - 914 pg/mL  Folate     Status: None   Collection Time: 06/19/22  2:36 PM  Result Value Ref Range   Folate >40.0 >5.9 ng/mL  C-reactive protein     Status: Abnormal   Collection Time: 06/19/22  2:36 PM  Result Value Ref Range   CRP 1.3 (H) <1.0 mg/dL  Lactate dehydrogenase     Status: None   Collection Time: 06/19/22  2:36 PM  Result Value Ref Range   LDH 143 98 - 192 U/L  D-dimer, quantitative     Status: None   Collection Time: 06/19/22  2:36 PM  Result Value Ref Range   D-Dimer, Quant 0.50 0.00 - 0.50 ug/mL-FEU  Glucose, capillary     Status: Abnormal   Collection Time: 06/19/22  6:20 PM  Result Value Ref Range   Glucose-Capillary 61 (L) 70 - 99 mg/dL  Glucose, capillary     Status: Abnormal   Collection Time: 06/19/22  6:45 PM  Result Value Ref Range   Glucose-Capillary 187 (H) 70 - 99 mg/dL  Glucose, capillary     Status: Abnormal   Collection Time: 06/19/22  9:15 PM  Result Value Ref Range   Glucose-Capillary 115 (H) 70 - 99 mg/dL  Procalcitonin     Status: None   Collection Time: 06/20/22  5:42 AM  Result Value Ref Range   Procalcitonin <0.10 ng/mL  CBC with Differential/Platelet     Status: None   Collection Time: 06/20/22  5:42 AM  Result Value Ref Range   WBC 8.1 4.0 - 10.5 K/uL   RBC 4.25 3.87 - 5.11 MIL/uL   Hemoglobin 12.6 12.0 - 15.0 g/dL   HCT 39.1 36.0 - 46.0 %   MCV 92.0 80.0 - 100.0 fL   MCH 29.6 26.0 - 34.0 pg   MCHC 32.2 30.0 - 36.0 g/dL   RDW 12.2 11.5 - 15.5 %    Platelets 364 150 - 400 K/uL   nRBC 0.0 0.0 - 0.2 %   Neutrophils Relative % 74 %   Neutro Abs 6.0 1.7 - 7.7 K/uL   Lymphocytes Relative 16 %   Lymphs Abs 1.3 0.7 - 4.0 K/uL   Monocytes Relative 8 %   Monocytes Absolute 0.7 0.1 - 1.0 K/uL   Eosinophils Relative 1 %   Eosinophils Absolute 0.1 0.0 - 0.5 K/uL   Basophils Relative 0 %   Basophils Absolute 0.0 0.0 - 0.1 K/uL   Immature Granulocytes 1 %  Abs Immature Granulocytes 0.05 0.00 - 0.07 K/uL  Comprehensive metabolic panel     Status: Abnormal   Collection Time: 06/20/22  5:42 AM  Result Value Ref Range   Sodium 143 135 - 145 mmol/L   Potassium 3.5 3.5 - 5.1 mmol/L   Chloride 106 98 - 111 mmol/L   CO2 28 22 - 32 mmol/L   Glucose, Bld 157 (H) 70 - 99 mg/dL   BUN 9 8 - 23 mg/dL   Creatinine, Ser 0.85 0.44 - 1.00 mg/dL   Calcium 9.0 8.9 - 10.3 mg/dL   Total Protein 6.5 6.5 - 8.1 g/dL   Albumin 3.5 3.5 - 5.0 g/dL   AST 19 15 - 41 U/L   ALT 18 0 - 44 U/L   Alkaline Phosphatase 123 38 - 126 U/L   Total Bilirubin 0.3 0.3 - 1.2 mg/dL   GFR, Estimated >60 >60 mL/min   Anion gap 9 5 - 15  C-reactive protein     Status: Abnormal   Collection Time: 06/20/22  5:42 AM  Result Value Ref Range   CRP 4.2 (H) <1.0 mg/dL  D-dimer, quantitative     Status: Abnormal   Collection Time: 06/20/22  5:42 AM  Result Value Ref Range   D-Dimer, Quant 0.52 (H) 0.00 - 0.50 ug/mL-FEU  Lactate dehydrogenase     Status: None   Collection Time: 06/20/22  5:42 AM  Result Value Ref Range   LDH 174 98 - 192 U/L  Glucose, capillary     Status: Abnormal   Collection Time: 06/20/22 12:29 PM  Result Value Ref Range   Glucose-Capillary <10 (LL) 70 - 99 mg/dL    I have Reviewed nursing notes, Vitals, and Lab results since pt's last encounter. Pertinent lab results : see above I have ordered test including BMP, CBC, Mg I have reviewed the last note from staff over past 24 hours I have discussed pt's care plan and test results with nursing staff, case  manager  Time spent: Greater than 50% of the 55 minute visit was spent in counseling/coordination of care for the patient as laid out in the A&P.    LOS: 1 day   Dwyane Dee, MD [facility] 06/20/2022, 12:50 PM

## 2022-06-20 NOTE — Evaluation (Signed)
Physical Therapy Evaluation Patient Details Name: Lindsey Melton MRN: [id number] DOB: 01/06/1951 Today's Date: 06/20/2022  History of Present Illness  71 yo female admitted with altered mentation. Pt with acute metabolic encephalopathy, pneumonia due to Covid and parainfluenza infections, UTI. PMH depression/anxiety, arthritis, DM II, COPD, HLD, HTN, substance use (alcohol, crack cocaine), bipolar, dementia (per daughter).  Clinical Impression  Pt admitted with above diagnosis.  Pt currently with functional limitations due to the deficits listed below (see PT Problem List). Pt lethargic beginning of session however improved with repeated cues to remain awake and agreeable to mobilize to recliner for lunch (which was in room).  Pt from home alone and using RW at baseline.  Recommend SNF upon d/c as pt with increased falls and decreased cognition.  Pt will benefit from skilled PT to increase their independence and safety with mobility to allow discharge to the venue listed below.          Recommendations for follow up therapy are one component of a multi-disciplinary discharge planning process, led by the attending physician.  Recommendations may be updated based on patient status, additional functional criteria and insurance authorization.  Follow Up Recommendations Skilled nursing-short term rehab (<3 hours/day) Can patient physically be transported by private vehicle: Yes    Assistance Recommended at Discharge Frequent or constant Supervision/Assistance  Patient can return home with the following  A little help with walking and/or transfers;A little help with bathing/dressing/bathroom;Assistance with cooking/housework    Equipment Recommendations None recommended by PT  Recommendations for Other Services       Functional Status Assessment Patient has had a recent decline in their functional status and demonstrates the ability to make significant improvements in function in a reasonable  and predictable amount of time.     Precautions / Restrictions Precautions Precautions: Fall Precaution Comments: covid Restrictions Weight Bearing Restrictions: No      Mobility  Bed Mobility Overal bed mobility: Needs Assistance Bed Mobility: Supine to Sit     Supine to sit: Min assist, HOB elevated     General bed mobility comments: cues for technique    Transfers Overall transfer level: Needs assistance Equipment used: Rolling walker (2 wheels) Transfers: Sit to/from Stand, Bed to chair/wheelchair/BSC Sit to Stand: Min assist Stand pivot transfers: Min assist         General transfer comment: assist for stability; cues for hand placement    Ambulation/Gait               General Gait Details: agreeable to OOB for lunch  Stairs            Wheelchair Mobility    Modified Rankin (Stroke Patients Only)       Balance Overall balance assessment: History of Falls, Needs assistance         Standing balance support: Bilateral upper extremity supported, Reliant on assistive device for balance Standing balance-Leahy Scale: Poor                               Pertinent Vitals/Pain Pain Assessment Pain Assessment: Faces Faces Pain Scale: Hurts a little bit Pain Location: generalized Pain Intervention(s): Repositioned, Limited activity within patient's tolerance    Home Living Family/patient expects to be discharged to:: Private residence Living Arrangements: Alone Available Help at Discharge: Family;Available PRN/intermittently Type of Home: Apartment         Home Layout: One level Home Equipment: Agricultural consultant (2 wheels)  Prior Function Prior Level of Function : Independent/Modified Independent             Mobility Comments: uses a RW; multiple falls ADLs Comments: able to bath/dress self; difficulty with the cognitive aspect of IADL tasks; daughter reports she puts itmes in the oven to cook but forgets what she  is doing; drops pills/medicaiton errors     Hand Dominance   Dominant Hand: Right    Extremity/Trunk Assessment   Upper Extremity Assessment Upper Extremity Assessment: Generalized weakness    Lower Extremity Assessment Lower Extremity Assessment: Generalized weakness    Cervical / Trunk Assessment Cervical / Trunk Assessment: Normal  Communication   Communication: HOH  Cognition Arousal/Alertness: Lethargic Behavior During Therapy: Flat affect, Restless Overall Cognitive Status: Impaired/Different from baseline Area of Impairment: Orientation, Attention, Memory, Following commands, Safety/judgement, Awareness, Problem solving                 Orientation Level: Disoriented to, Place, Time, Situation (unable to say her last name) Current Attention Level: Sustained Memory: Decreased recall of precautions, Decreased short-term memory Following Commands: Follows one step commands with increased time Safety/Judgement: Decreased awareness of safety, Decreased awareness of deficits Awareness: Intellectual Problem Solving: Slow processing, Decreased initiation          General Comments      Exercises     Assessment/Plan    PT Assessment Patient needs continued PT services  PT Problem List Decreased strength;Decreased mobility;Decreased activity tolerance;Decreased balance;Decreased knowledge of use of DME       PT Treatment Interventions DME instruction;Balance training;Therapeutic exercise;Gait training;Functional mobility training;Therapeutic activities;Patient/family education    PT Goals (Current goals can be found in the Care Plan section)  Acute Rehab PT Goals PT Goal Formulation: With patient Time For Goal Achievement: 07/04/22 Potential to Achieve Goals: Good    Frequency Min 2X/week     Co-evaluation PT/OT/SLP Co-Evaluation/Treatment: Yes Reason for Co-Treatment: Necessary to address cognition/behavior during functional activity PT goals  addressed during session: Mobility/safety with mobility OT goals addressed during session: ADL's and self-care       AM-PAC PT "6 Clicks" Mobility  Outcome Measure Help needed turning from your back to your side while in a flat bed without using bedrails?: A Little Help needed moving from lying on your back to sitting on the side of a flat bed without using bedrails?: A Little Help needed moving to and from a bed to a chair (including a wheelchair)?: A Little Help needed standing up from a chair using your arms (e.g., wheelchair or bedside chair)?: A Little Help needed to walk in hospital room?: A Little Help needed climbing 3-5 steps with a railing? : A Lot 6 Click Score: 17    End of Session Equipment Utilized During Treatment: Gait belt Activity Tolerance: Patient tolerated treatment well Patient left: in chair;with call bell/phone within reach;with chair alarm set Nurse Communication: Mobility status PT Visit Diagnosis: Difficulty in walking, not elsewhere classified (R26.2);Muscle weakness (generalized) (M62.81)    Time: 5409-8119 PT Time Calculation (min) (ACUTE ONLY): 13 min   Charges:   PT Evaluation $PT Eval Low Complexity: 1 Low         Kati PT, DPT Physical Therapist Acute Rehabilitation Services Preferred contact method: Secure Chat Weekend Pager Only: [phone] Office: [phone]   Janan Halter [place] 06/20/2022, 2:54 PM

## 2022-06-21 DIAGNOSIS — R5381 Other malaise: Secondary | ICD-10-CM | POA: Diagnosis not present

## 2022-06-21 DIAGNOSIS — N3000 Acute cystitis without hematuria: Secondary | ICD-10-CM | POA: Diagnosis not present

## 2022-06-21 DIAGNOSIS — G9341 Metabolic encephalopathy: Secondary | ICD-10-CM | POA: Diagnosis not present

## 2022-06-21 DIAGNOSIS — J189 Pneumonia, unspecified organism: Secondary | ICD-10-CM | POA: Diagnosis not present

## 2022-06-21 LAB — COMPREHENSIVE METABOLIC PANEL
ALT: 20 U/L (ref 0–44)
AST: 25 U/L (ref 15–41)
Albumin: 3.2 g/dL — ABNORMAL LOW (ref 3.5–5.0)
Alkaline Phosphatase: 120 U/L (ref 38–126)
Anion gap: 10 (ref 5–15)
BUN: 19 mg/dL (ref 8–23)
CO2: 25 mmol/L (ref 22–32)
Calcium: 8.7 mg/dL — ABNORMAL LOW (ref 8.9–10.3)
Chloride: 101 mmol/L (ref 98–111)
Creatinine, Ser: 1.14 mg/dL — ABNORMAL HIGH (ref 0.44–1.00)
GFR, Estimated: 52 mL/min — ABNORMAL LOW (ref >60)
Glucose, Bld: 339 mg/dL — ABNORMAL HIGH (ref 70–99)
Potassium: 3.2 mmol/L — ABNORMAL LOW (ref 3.5–5.1)
Sodium: 136 mmol/L (ref 135–145)
Total Bilirubin: 0.3 mg/dL (ref 0.3–1.2)
Total Protein: 6.3 g/dL — ABNORMAL LOW (ref 6.5–8.1)

## 2022-06-21 LAB — CBC WITH DIFFERENTIAL/PLATELET
Abs Immature Granulocytes: 0.07 10*3/uL (ref 0.00–0.07)
Basophils Absolute: 0 10*3/uL (ref 0.0–0.1)
Basophils Relative: 1 %
Eosinophils Absolute: 0.2 10*3/uL (ref 0.0–0.5)
Eosinophils Relative: 3 %
HCT: 37.5 % (ref 36.0–46.0)
Hemoglobin: 12.1 g/dL (ref 12.0–15.0)
Immature Granulocytes: 1 %
Lymphocytes Relative: 22 %
Lymphs Abs: 1.3 10*3/uL (ref 0.7–4.0)
MCH: 29.5 pg (ref 26.0–34.0)
MCHC: 32.3 g/dL (ref 30.0–36.0)
MCV: 91.5 fL (ref 80.0–100.0)
Monocytes Absolute: 0.5 10*3/uL (ref 0.1–1.0)
Monocytes Relative: 7 %
Neutro Abs: 4.2 10*3/uL (ref 1.7–7.7)
Neutrophils Relative %: 66 %
Platelets: 350 10*3/uL (ref 150–400)
RBC: 4.1 MIL/uL (ref 3.87–5.11)
RDW: 12.2 % (ref 11.5–15.5)
WBC: 6.2 10*3/uL (ref 4.0–10.5)
nRBC: 0 % (ref 0.0–0.2)

## 2022-06-21 LAB — GLUCOSE, CAPILLARY
Glucose-Capillary: 225 mg/dL — ABNORMAL HIGH (ref 70–99)
Glucose-Capillary: 312 mg/dL — ABNORMAL HIGH (ref 70–99)
Glucose-Capillary: 187 mg/dL — ABNORMAL HIGH (ref 70–99)
Glucose-Capillary: 216 mg/dL — ABNORMAL HIGH (ref 70–99)

## 2022-06-21 LAB — LACTATE DEHYDROGENASE: LDH: 125 U/L (ref 98–192)

## 2022-06-21 LAB — C-REACTIVE PROTEIN: CRP: 3.1 mg/dL — ABNORMAL HIGH (ref <1.0)

## 2022-06-21 LAB — HEMOGLOBIN A1C
Hgb A1c MFr Bld: 10.8 % — ABNORMAL HIGH (ref 4.8–5.6)
Mean Plasma Glucose: 263 mg/dL

## 2022-06-21 LAB — URINE CULTURE: Culture: >=100000 — AB

## 2022-06-21 LAB — D-DIMER, QUANTITATIVE: D-Dimer, Quant: 0.36 ug/mL-FEU (ref 0.00–0.50)

## 2022-06-21 LAB — PROCALCITONIN: Procalcitonin: <0.1 ng/mL

## 2022-06-21 MED ORDER — METRONIDAZOLE 500 MG/100ML IV SOLN
500.0000 mg | Freq: Two times a day (BID) | INTRAVENOUS | Status: DC
  Administered 2022-06-21 – 2022-06-24 (×6): 500 mg via INTRAVENOUS
  Filled 2022-06-21 (×6): qty 100

## 2022-06-21 NOTE — NC FL2 (Signed)
[place] LEVEL OF CARE FORM     IDENTIFICATION  Patient Name: Lindsey Melton Birthdate: 06/11/1951 Sex: female Admission Date (Current Location): 06/18/2022  [facility] and [place] Number:  Herbalist and Address:  [facility],  [place], [place]      Provider Number: [id number]  Attending Physician Name and Address:  Dwyane Dee, MD  Relative Name and Phone Number:  Daughter, Autumn Messing [phone]    Current Level of Care: Hospital Recommended Level of Care: [place] Prior Approval Number:    Date Approved/Denied:   PASRR Number: [id number]  Discharge Plan: SNF    Current Diagnoses: Patient Active Problem List   Diagnosis Date Noted   Acute metabolic encephalopathy [id number]   Pneumonia due to Covid and Parainfluenza infections 06/19/2022   Alcohol abuse 06/19/2022   Physical deconditioning 06/19/2022   Closed trimalleolar fracture of right ankle 10/07/20 ORIF  10/14/2020   Bipolar 1 disorder, depressed, moderate ([place]) 10/17/2019   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, moderate stage 04/24/2019   Intermediate stage nonexudative age-related macular degeneration of both eyes 04/24/2019   Nonproliferative diabetic retinopathy associated with type 2 diabetes mellitus ([place]) 04/24/2019   Combined forms of age-related cataract of left eye 03/21/2019   Seasonal allergic rhinitis due to pollen 10/23/2018   Gastroesophageal reflux disease without esophagitis 09/19/2018   Personality disorder ([place]) 09/05/2017   Asthma 10/12/2016   HLD (hyperlipidemia) 10/12/2016   History of drug abuse ([place]) 10/12/2016   Hypertension associated with diabetes ([place]) 10/12/2016   Major depressive disorder 02/26/2016   Cocaine abuse in remission ([place]) 02/26/2016   Alcoholism in remission ([place]) 02/26/2016   Healthcare maintenance 12/26/2015   Cervical myelopathy with cervical radiculopathy  ([place]) 09/16/2015   Benign essential HTN    Type 2 diabetes mellitus ([place])    Chronic obstructive pulmonary disease (HCC)    Post-operative pain    Acute blood loss anemia    Dysphagia    Myelopathy ([place]) 09/10/2015   S/P cervical spinal fusion    Surgery, elective    Postprocedural hypotension    Spondylosis, cervical, with myelopathy 08/24/2015   Polysubstance abuse ([place]) 08/24/2015   UTI (urinary tract infection) 08/18/2015   Cold extremities 08/08/2015   Leg pain 08/08/2015   Muscle weakness 07/29/2015   Neck pain 07/25/2015   Hyponatremia 07/25/2015    Orientation RESPIRATION BLADDER Height & Weight     Self  Normal Continent Weight: 162 lb 4.1 oz (73.6 kg) Height:   (patient is unable to provide height at this time)  BEHAVIORAL SYMPTOMS/MOOD NEUROLOGICAL BOWEL NUTRITION STATUS      Incontinent Diet (Regular)  AMBULATORY STATUS COMMUNICATION OF NEEDS Skin   Limited Assist Verbally Normal                       Personal Care Assistance Level of Assistance  Bathing, Feeding, Dressing Bathing Assistance: Limited assistance Feeding assistance: Limited assistance Dressing Assistance: Maximum assistance     Functional Limitations Info  Sight, Hearing, Speech Sight Info: Adequate Hearing Info: Adequate Speech Info: Adequate    SPECIAL CARE FACTORS FREQUENCY  PT (By licensed PT), OT (By licensed OT)     PT Frequency: 5x/wk OT Frequency: 5x/wk            Contractures Contractures Info: Not present    Additional Factors Info  Code Status, Allergies, Psychotropic Code Status Info: FULL Allergies Info: Fluoxetine  Hcl, Prozac (Fluoxetine Hcl), Gabapentin Psychotropic Info: See MAR         Current Medications (06/21/2022):  This is the current hospital active medication list Current Facility-Administered Medications  Medication Dose Route Frequency Provider Last Rate Last Admin   acetaminophen (TYLENOL) tablet 650 mg  650 mg Oral Q6H PRN Shela Leff, MD   650 mg at 06/21/22 1033   Or   acetaminophen (TYLENOL) suppository 650 mg  650 mg Rectal Q6H PRN Shela Leff, MD       ARIPiprazole (ABILIFY) tablet 2.5 mg  2.5 mg Oral Daily Dwyane Dee, MD   2.5 mg at 06/21/22 [id number]   Followed by   Derrill Memo ON 06/24/2022] ARIPiprazole (ABILIFY) tablet 5 mg  5 mg Oral Daily Dwyane Dee, MD       brimonidine (ALPHAGAN) 0.2 % ophthalmic solution 1 drop  1 drop Both Eyes TID Dwyane Dee, MD   1 drop at 06/21/22 0859   cefTRIAXone (ROCEPHIN) 1 g in sodium chloride 0.9 % 100 mL IVPB  1 g Intravenous Q24H Dwyane Dee, MD 200 mL/hr at 06/21/22 0131 1 g at 06/21/22 0131   cyanocobalamin (VITAMIN B12) tablet 1,000 mcg  1,000 mcg Oral Daily Dwyane Dee, MD   1,000 mcg at 06/21/22 0856   dorzolamide-timolol (COSOPT) 2-0.5 % ophthalmic solution 1 drop  1 drop Both Eyes BID Dwyane Dee, MD   1 drop at 06/21/22 0900   enoxaparin (LOVENOX) injection 40 mg  40 mg Subcutaneous Q24H Shela Leff, MD   40 mg at [id number]   folic acid (FOLVITE) tablet 1 mg  1 mg Oral Daily Shela Leff, MD   1 mg at 06/21/22 0856   haloperidol lactate (HALDOL) injection 2 mg  2 mg Intravenous Q6H PRN Dwyane Dee, MD   2 mg at 06/19/22 2328   insulin aspart (novoLOG) injection 0-15 Units  0-15 Units Subcutaneous TID WC Shela Leff, MD   11 Units at 06/21/22 1211   insulin aspart (novoLOG) injection 0-5 Units  0-5 Units Subcutaneous QHS Shela Leff, MD       lamoTRIgine (LAMICTAL) tablet 250 mg  250 mg Oral Daily Dwyane Dee, MD   250 mg at 06/21/22 0854   latanoprost (XALATAN) 0.005 % ophthalmic solution 1 drop  1 drop Both Eyes QHS Dwyane Dee, MD       lisinopril (ZESTRIL) tablet 40 mg  40 mg Oral Daily Dwyane Dee, MD   40 mg at 06/21/22 0856   LORazepam (ATIVAN) tablet 1-4 mg  1-4 mg Oral Q1H PRN Shela Leff, MD   1 mg at 06/19/22 0932   Or   LORazepam (ATIVAN) injection 1-4 mg  1-4 mg Intravenous Q1H PRN  Shela Leff, MD   1 mg at 06/21/22 0212   mometasone-formoterol (DULERA) 200-5 MCG/ACT inhaler 2 puff  2 puff Inhalation BID Dwyane Dee, MD   2 puff at 06/20/22 1048   multivitamin with minerals tablet 1 tablet  1 tablet Oral Daily Shela Leff, MD   1 tablet at 06/21/22 0856   oxyCODONE (Oxy IR/ROXICODONE) immediate release tablet 5 mg  5 mg Oral Q4H PRN Dwyane Dee, MD   5 mg at 06/20/22 [id number]   thiamine (VITAMIN B1) tablet 100 mg  100 mg Oral Daily Shela Leff, MD   100 mg at 06/21/22 [id number]   Or   thiamine (VITAMIN B1) injection 100 mg  100 mg Intravenous Daily Shela Leff, MD       umeclidinium bromide (INCRUSE ELLIPTA) 62.5  MCG/ACT 1 puff  1 puff Inhalation Daily Lewie Chamber, MD   1 puff at 06/20/22 1049   venlafaxine XR (EFFEXOR-XR) 24 hr capsule 150 mg  150 mg Oral Q breakfast Lewie Chamber, MD   150 mg at 06/21/22 6283     Discharge Medications: Please see discharge summary for a list of discharge medications.  Relevant Imaging Results:  Relevant Lab Results:   Additional Information SSN: [id number]  Otelia Santee, LCSW

## 2022-06-21 NOTE — Progress Notes (Signed)
Progress Note    Lindsey Melton   YQM:[id number]  DOB: [birth year]  DOA: 06/18/2022     2 PCP: Rebecka Apley, NP  Initial CC: AMS  Hospital Course: [name] is a 71 yo female with PMH depression/anxiety, arthritis, DM II, COPD, HLD, HTN, substance use (alcohol, crack cocaine), bipolar.  She presented with altered mentation. She underwent workup on admission. CT head notable for diffuse atrophy otherwise no acute abnormalities.  CXR showed mild patchy airspace disease notably at the right lung base. Lab work relatively unremarkable. UA was noted with large LE, negative nitrite, greater than 50 WBC, rare bacteria. Respiratory swabs were positive for COVID and parainfluenza.  She was started on Paxlovid and admitted for further monitoring.  Interval History:  No events overnight.  Apparently she's been declining paxlovid stating she didn't think she had covid.  She does not have proper capacity unfortunately at this time too.  Treatment plan changed as noted below. SNF pursuit commenced.   Assessment and Plan: * Acute metabolic encephalopathy - Continue treatment for UTI and pneumonias; per daughter, patient also may take in excessive alcohol, monitor for any signs of alcohol withdrawal -Per daughter, baseline mentation is also rather poor.  She is able to only answer a few questions here and there but has not been able to carry on conversation for quite some time.  She also has poor attention span and concentration ability  Pneumonia due to Covid and Parainfluenza infections - CXR noted with patchy interstitial opacities, more prominent in the right lung base - Etiology likely viral given COVID-positive and parainfluenza positive -Unfortunately, patient has refused most of her Paxlovid doses; clinically she is stable; hold off on transitioning to remdesivir and will monitor clinically for now off of treatment - PCT negative, d/c azithro -Currently on room air, hold off on  steroids  Physical deconditioning - PT/OT eval  UTI (urinary tract infection) - Given gross confusion, treating for presumed UTI as well - UA noted with large LE, negative nitrite, greater than 50 WBC, rare bacteria -Urine culture growing Gardnerella - d/c rocephin and start flagyl - check urine for GC/CHL also. HIV negative  Alcohol abuse - continue CIWA -Ethanol negative on admission  Bipolar 1 disorder, depressed, moderate (HCC) - Continue Abilify, Lamictal, Effexor  Hypertension associated with diabetes (HCC) - Continue lisinopril.  Hold HCTZ  HLD (hyperlipidemia) - Hold statin for now  Type 2 diabetes mellitus (HCC) - Continue SSI and CBG monitoring   Old records reviewed in assessment of this patient  Antimicrobials: Paxlovid 06/19/22 >> patient declined several days/doses. D/C Rocephin 12/9 >> 12/11 Flagyl 12/11 >> current   DVT prophylaxis:  enoxaparin (LOVENOX) injection 40 mg Start: 06/19/22 1000   Code Status:   Code Status: Full Code  Mobility Assessment (last 72 hours)     Mobility Assessment     Row Name 06/21/22 1432 06/20/22 1355 06/20/22 1351 06/20/22 1300     Does patient have an order for bedrest or is patient medically unstable No - Continue assessment No - Continue assessment -- --    What is the highest level of mobility based on the progressive mobility assessment? Level 4 (Walks with assist in room) - Balance while marching in place and cannot step forward and back - Complete Level 4 (Walks with assist in room) - Balance while marching in place and cannot step forward and back - Complete Level 4 (Walks with assist in room) - Balance while marching in place and  cannot step forward and back - Complete Level 4 (Walks with assist in room) - Balance while marching in place and cannot step forward and back - Complete             Barriers to discharge:  Disposition Plan:  SNF Status is: Inpt  Objective: Blood pressure (!) 142/81, pulse 78,  temperature 97.8 F (36.6 C), temperature source Oral, resp. rate 18, height 5\' 1"  (1.549 m), weight 73.6 kg, SpO2 97 %.  Examination:  Physical Exam Constitutional:      Comments: Grossly confused elderly woman lying in bed in no distress  HENT:     Head: Normocephalic and atraumatic.     Mouth/Throat:     Mouth: Mucous membranes are moist.  Eyes:     Extraocular Movements: Extraocular movements intact.  Cardiovascular:     Rate and Rhythm: Normal rate and regular rhythm.  Pulmonary:     Effort: Pulmonary effort is normal.     Comments: Coarse breath sounds bilaterally, no wheezing Abdominal:     General: Bowel sounds are normal. There is no distension.     Palpations: Abdomen is soft.     Tenderness: There is no abdominal tenderness.  Musculoskeletal:        General: Normal range of motion.     Cervical back: Normal range of motion.  Skin:    General: Skin is warm and dry.  Neurological:     Comments: Follows some commands.  No obvious focal deficits.  Encephalopathy precludes full exam. Moves all 4 extremities       Consultants:    Procedures:    Data Reviewed: Results for orders placed or performed during the hospital encounter of 06/18/22 (from the past 24 hour(s))  Glucose, capillary     Status: Abnormal   Collection Time: 06/20/22  5:17 PM  Result Value Ref Range   Glucose-Capillary 199 (H) 70 - 99 mg/dL  Glucose, capillary     Status: Abnormal   Collection Time: 06/20/22  5:24 PM  Result Value Ref Range   Glucose-Capillary 211 (H) 70 - 99 mg/dL   Comment 1 Notify RN   Glucose, capillary     Status: Abnormal   Collection Time: 06/20/22  9:06 PM  Result Value Ref Range   Glucose-Capillary 179 (H) 70 - 99 mg/dL  Procalcitonin     Status: None   Collection Time: 06/21/22  5:35 AM  Result Value Ref Range   Procalcitonin <0.10 ng/mL  CBC with Differential/Platelet     Status: None   Collection Time: 06/21/22  5:35 AM  Result Value Ref Range   WBC 6.2 4.0 -  10.5 K/uL   RBC 4.10 3.87 - 5.11 MIL/uL   Hemoglobin 12.1 12.0 - 15.0 g/dL   HCT 14/11/23 33.5 - 45.6 %   MCV 91.5 80.0 - 100.0 fL   MCH 29.5 26.0 - 34.0 pg   MCHC 32.3 30.0 - 36.0 g/dL   RDW 25.6 38.9 - 37.3 %   Platelets 350 150 - 400 K/uL   nRBC 0.0 0.0 - 0.2 %   Neutrophils Relative % 66 %   Neutro Abs 4.2 1.7 - 7.7 K/uL   Lymphocytes Relative 22 %   Lymphs Abs 1.3 0.7 - 4.0 K/uL   Monocytes Relative 7 %   Monocytes Absolute 0.5 0.1 - 1.0 K/uL   Eosinophils Relative 3 %   Eosinophils Absolute 0.2 0.0 - 0.5 K/uL   Basophils Relative 1 %   Basophils Absolute  0.0 0.0 - 0.1 K/uL   Immature Granulocytes 1 %   Abs Immature Granulocytes 0.07 0.00 - 0.07 K/uL  Comprehensive metabolic panel     Status: Abnormal   Collection Time: 06/21/22  5:35 AM  Result Value Ref Range   Sodium 136 135 - 145 mmol/L   Potassium 3.2 (L) 3.5 - 5.1 mmol/L   Chloride 101 98 - 111 mmol/L   CO2 25 22 - 32 mmol/L   Glucose, Bld 339 (H) 70 - 99 mg/dL   BUN 19 8 - 23 mg/dL   Creatinine, Ser 3.49 (H) 0.44 - 1.00 mg/dL   Calcium 8.7 (L) 8.9 - 10.3 mg/dL   Total Protein 6.3 (L) 6.5 - 8.1 g/dL   Albumin 3.2 (L) 3.5 - 5.0 g/dL   AST 25 15 - 41 U/L   ALT 20 0 - 44 U/L   Alkaline Phosphatase 120 38 - 126 U/L   Total Bilirubin 0.3 0.3 - 1.2 mg/dL   GFR, Estimated 52 (L) >60 mL/min   Anion gap 10 5 - 15  C-reactive protein     Status: Abnormal   Collection Time: 06/21/22  5:35 AM  Result Value Ref Range   CRP 3.1 (H) <1.0 mg/dL  D-dimer, quantitative     Status: None   Collection Time: 06/21/22  5:35 AM  Result Value Ref Range   D-Dimer, Quant 0.36 0.00 - 0.50 ug/mL-FEU  Lactate dehydrogenase     Status: None   Collection Time: 06/21/22  5:35 AM  Result Value Ref Range   LDH 125 98 - 192 U/L  Glucose, capillary     Status: Abnormal   Collection Time: 06/21/22  7:59 AM  Result Value Ref Range   Glucose-Capillary 225 (H) 70 - 99 mg/dL   Comment 1 Notify RN    Comment 2 Document in Chart   Glucose,  capillary     Status: Abnormal   Collection Time: 06/21/22 11:59 AM  Result Value Ref Range   Glucose-Capillary 312 (H) 70 - 99 mg/dL   Comment 1 Notify RN    Comment 2 Document in Chart   Glucose, capillary     Status: Abnormal   Collection Time: 06/21/22  4:27 PM  Result Value Ref Range   Glucose-Capillary 187 (H) 70 - 99 mg/dL   Comment 1 Notify RN    Comment 2 Document in Chart     I have Reviewed nursing notes, Vitals, and Lab results since pt's last encounter. Pertinent lab results : see above I have ordered test including BMP, CBC, Mg I have reviewed the last note from staff over past 24 hours I have discussed pt's care plan and test results with nursing staff, case manager  Time spent: Greater than 50% of the 55 minute visit was spent in counseling/coordination of care for the patient as laid out in the A&P.    LOS: 2 days   Lewie Chamber, MD [facility] 06/21/2022, 4:51 PM

## 2022-06-21 NOTE — TOC Initial Note (Signed)
Transition of Care [facility]) - Initial/Assessment Note    Patient Details  Name: KLARE CRISS MRN: [id number] Date of Birth: Oct 19, 1950  Transition of Care [facility]) CM/SW Contact:    Otelia Santee, LCSW Phone Number: 06/21/2022, 2:26 PM  Clinical Narrative:                 Spoke with pt's daughter who is agreeable to SNF placement for this pt. She prefers placement in [facility] and does not have facility preference at this time. Pt is currently COVID positive and will require isolation period prior to being able to transfer.  Will send referrals for SNF placement closer to end of isolation period.   Expected Discharge Plan: Skilled Nursing Facility Barriers to Discharge: SNF Covid   Patient Goals and CMS Choice Patient states their goals for this hospitalization and ongoing recovery are:: For patient to transfer to SNF CMS Medicare.gov Compare Post Acute Care list provided to:: Patient Represenative (must comment) (Daughter) Choice offered to / list presented to : Adult Children  Expected Discharge Plan and Services Expected Discharge Plan: Skilled Nursing Facility In-house Referral: NA Discharge Planning Services: CM Consult Post Acute Care Choice: Skilled Nursing Facility Living arrangements for the past 2 months: Apartment                 DME Arranged: N/A DME Agency: NA                  Prior Living Arrangements/Services Living arrangements for the past 2 months: Apartment Lives with:: Self Patient language and need for interpreter reviewed:: Yes Do you feel safe going back to the place where you live?: Yes      Need for Family Participation in Patient Care: Yes (Comment) Care giver support system in place?: No (comment) Current home services: DME Criminal Activity/Legal Involvement Pertinent to Current Situation/Hospitalization: No - Comment as needed  Activities of Daily Living      Permission Sought/Granted Permission sought to share  information with : Facility Medical sales representative, Family Supports Permission granted to share information with : No              Emotional Assessment   Attitude/Demeanor/Rapport: Unable to Assess Affect (typically observed): Unable to Assess Orientation: : Oriented to Self Alcohol / Substance Use: Alcohol Use Psych Involvement: No (comment)  Admission diagnosis:  Acute cystitis without hematuria [N30.00] Acute encephalopathy [G93.40] Altered mental status, unspecified altered mental status type [R41.82] Acute metabolic encephalopathy [G93.41] Community acquired pneumonia, unspecified laterality [J18.9] Patient Active Problem List   Diagnosis Date Noted   Acute metabolic encephalopathy 06/19/2022   Pneumonia due to Covid and Parainfluenza infections 06/19/2022   Alcohol abuse 06/19/2022   Physical deconditioning 06/19/2022   Closed trimalleolar fracture of right ankle 10/07/20 ORIF  10/14/2020   Bipolar 1 disorder, depressed, moderate (HCC) 10/17/2019   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, moderate stage 04/24/2019   Intermediate stage nonexudative age-related macular degeneration of both eyes 04/24/2019   Nonproliferative diabetic retinopathy associated with type 2 diabetes mellitus (HCC) 04/24/2019   Combined forms of age-related cataract of left eye 03/21/2019   Seasonal allergic rhinitis due to pollen 10/23/2018   Gastroesophageal reflux disease without esophagitis 09/19/2018   Personality disorder (HCC) 09/05/2017   Asthma 10/12/2016   HLD (hyperlipidemia) 10/12/2016   History of drug abuse (HCC) 10/12/2016   Hypertension associated with diabetes (HCC) 10/12/2016   Major depressive disorder 02/26/2016   Cocaine abuse in remission (HCC)  02/26/2016   Alcoholism in remission (HCC) 02/26/2016   Healthcare maintenance 12/26/2015   Cervical myelopathy with cervical radiculopathy (HCC) 09/16/2015   Benign essential HTN    Type 2 diabetes  mellitus (HCC)    Chronic obstructive pulmonary disease (HCC)    Post-operative pain    Acute blood loss anemia    Dysphagia    Myelopathy (HCC) 09/10/2015   S/P cervical spinal fusion    Surgery, elective    Postprocedural hypotension    Spondylosis, cervical, with myelopathy 08/24/2015   Polysubstance abuse (HCC) 08/24/2015   UTI (urinary tract infection) 08/18/2015   Cold extremities 08/08/2015   Leg pain 08/08/2015   Muscle weakness 07/29/2015   Neck pain 07/25/2015   Hyponatremia 07/25/2015   PCP:  Rebecka Apley, NP Pharmacy:   [facility] [place], [place] - [address] [place] [address] Phone: [phone] Fax: [phone]     Social Determinants of Health (SDOH) Interventions    Readmission Risk Interventions    06/21/2022    2:24 PM  Readmission Risk Prevention Plan  Transportation Screening Complete  PCP or Specialist Appt within 5-7 Days Complete  Home Care Screening Complete  Medication Review (RN CM) Complete

## 2022-06-22 DIAGNOSIS — G9341 Metabolic encephalopathy: Secondary | ICD-10-CM | POA: Diagnosis not present

## 2022-06-22 DIAGNOSIS — J189 Pneumonia, unspecified organism: Secondary | ICD-10-CM | POA: Diagnosis not present

## 2022-06-22 DIAGNOSIS — N3000 Acute cystitis without hematuria: Secondary | ICD-10-CM | POA: Diagnosis not present

## 2022-06-22 DIAGNOSIS — R5381 Other malaise: Secondary | ICD-10-CM | POA: Diagnosis not present

## 2022-06-22 LAB — BASIC METABOLIC PANEL
Anion gap: 11 (ref 5–15)
BUN: 20 mg/dL (ref 8–23)
CO2: 24 mmol/L (ref 22–32)
Calcium: 8.6 mg/dL — ABNORMAL LOW (ref 8.9–10.3)
Chloride: 105 mmol/L (ref 98–111)
Creatinine, Ser: 0.91 mg/dL (ref 0.44–1.00)
GFR, Estimated: >60 mL/min (ref >60)
Glucose, Bld: 240 mg/dL — ABNORMAL HIGH (ref 70–99)
Potassium: 3.6 mmol/L (ref 3.5–5.1)
Sodium: 140 mmol/L (ref 135–145)

## 2022-06-22 LAB — CBC WITH DIFFERENTIAL/PLATELET
Abs Immature Granulocytes: 0.06 10*3/uL (ref 0.00–0.07)
Basophils Absolute: 0.1 10*3/uL (ref 0.0–0.1)
Basophils Relative: 1 %
Eosinophils Absolute: 0.2 10*3/uL (ref 0.0–0.5)
Eosinophils Relative: 3 %
HCT: 35.2 % — ABNORMAL LOW (ref 36.0–46.0)
Hemoglobin: 11.4 g/dL — ABNORMAL LOW (ref 12.0–15.0)
Immature Granulocytes: 1 %
Lymphocytes Relative: 27 %
Lymphs Abs: 1.5 10*3/uL (ref 0.7–4.0)
MCH: 29.6 pg (ref 26.0–34.0)
MCHC: 32.4 g/dL (ref 30.0–36.0)
MCV: 91.4 fL (ref 80.0–100.0)
Monocytes Absolute: 0.5 10*3/uL (ref 0.1–1.0)
Monocytes Relative: 10 %
Neutro Abs: 3.1 10*3/uL (ref 1.7–7.7)
Neutrophils Relative %: 58 %
Platelets: 369 10*3/uL (ref 150–400)
RBC: 3.85 MIL/uL — ABNORMAL LOW (ref 3.87–5.11)
RDW: 11.9 % (ref 11.5–15.5)
WBC: 5.4 10*3/uL (ref 4.0–10.5)
nRBC: 0 % (ref 0.0–0.2)

## 2022-06-22 LAB — MAGNESIUM: Magnesium: 2.1 mg/dL (ref 1.7–2.4)

## 2022-06-22 LAB — GLUCOSE, CAPILLARY
Glucose-Capillary: 170 mg/dL — ABNORMAL HIGH (ref 70–99)
Glucose-Capillary: 252 mg/dL — ABNORMAL HIGH (ref 70–99)
Glucose-Capillary: 179 mg/dL — ABNORMAL HIGH (ref 70–99)
Glucose-Capillary: 173 mg/dL — ABNORMAL HIGH (ref 70–99)

## 2022-06-22 MED ORDER — ARIPIPRAZOLE 5 MG PO TABS
5.0000 mg | ORAL_TABLET | Freq: Every day | ORAL | Status: DC
  Administered 2022-06-23 – 2022-06-30 (×8): 5 mg via ORAL
  Filled 2022-06-22 (×8): qty 1

## 2022-06-22 NOTE — Progress Notes (Signed)
Progress Note    Lindsey Melton   HYI:[id number]  DOB: Nov 06, 1950  DOA: 06/18/2022     3 PCP: Rebecka Apley, NP  Initial CC: AMS  Hospital Course: Lindsey Melton is a 71 yo female with PMH depression/anxiety, arthritis, DM II, COPD, HLD, HTN, substance use (alcohol, crack cocaine), bipolar.  She presented with altered mentation. She underwent workup on admission. CT head notable for diffuse atrophy otherwise no acute abnormalities.  CXR showed mild patchy airspace disease notably at the right lung base. Lab work relatively unremarkable. UA was noted with large LE, negative nitrite, greater than 50 WBC, rare bacteria. Respiratory swabs were positive for COVID and parainfluenza.  She was started on Paxlovid and admitted for further monitoring.  Interval History:  No events overnight.  Mentation seems to be slightly better today.  Understands recommendation is still for rehab.  Assessment and Plan: * Acute metabolic encephalopathy - Continue treatment for UTI and pneumonias; per daughter, patient also may take in excessive alcohol, monitor for any signs of alcohol withdrawal -Per daughter, baseline mentation is also rather poor.  She is able to only answer a few questions here and there but has not been able to carry on conversation for quite some time.  She also has poor attention span and concentration ability  Pneumonia due to Covid and Parainfluenza infections - CXR noted with patchy interstitial opacities, more prominent in the right lung base - Etiology likely viral given COVID-positive and parainfluenza positive -Unfortunately, patient has refused most of her Paxlovid doses; clinically she is stable; hold off on transitioning to remdesivir and will monitor clinically for now off of treatment - PCT negative, d/c azithro -Currently on room air, hold off on steroids  Physical deconditioning - PT/OT eval  UTI (urinary tract infection) - Given gross confusion, treating for  presumed UTI as well - UA noted with large LE, negative nitrite, greater than 50 WBC, rare bacteria -Urine culture growing Gardnerella - d/c rocephin and start flagyl - check urine for GC/CHL also. HIV negative  Alcohol abuse - continue CIWA -Ethanol negative on admission  Bipolar 1 disorder, depressed, moderate (HCC) - Continue Abilify, Lamictal, Effexor  Hypertension associated with diabetes (HCC) - Continue lisinopril.  Hold HCTZ  HLD (hyperlipidemia) - Hold statin for now  Type 2 diabetes mellitus (HCC) - Continue SSI and CBG monitoring   Old records reviewed in assessment of this patient  Antimicrobials: Paxlovid 06/19/22 >> patient declined several days/doses. D/C Rocephin 12/9 >> 12/11 Flagyl 12/11 >> current   DVT prophylaxis:  enoxaparin (LOVENOX) injection 40 mg Start: 06/19/22 1000   Code Status:   Code Status: Full Code  Mobility Assessment (last 72 hours)     Mobility Assessment     Row Name 06/21/22 1432 06/20/22 1355 06/20/22 1351 06/20/22 1300     Does patient have an order for bedrest or is patient medically unstable No - Continue assessment No - Continue assessment -- --    What is the highest level of mobility based on the progressive mobility assessment? Level 4 (Walks with assist in room) - Balance while marching in place and cannot step forward and back - Complete Level 4 (Walks with assist in room) - Balance while marching in place and cannot step forward and back - Complete Level 4 (Walks with assist in room) - Balance while marching in place and cannot step forward and back - Complete Level 4 (Walks with assist in room) - Balance while marching in place and  cannot step forward and back - Complete             Barriers to discharge:  Disposition Plan:  SNF Status is: Inpt  Objective: Blood pressure 135/73, pulse 81, temperature 98.6 F (37 C), temperature source Oral, resp. rate 18, height 5\' 1"  (1.549 m), weight 73.6 kg, SpO2 98 %.   Examination:  Physical Exam Constitutional:      Comments: Grossly confused elderly woman lying in bed in no distress  HENT:     Head: Normocephalic and atraumatic.     Mouth/Throat:     Mouth: Mucous membranes are moist.  Eyes:     Extraocular Movements: Extraocular movements intact.  Cardiovascular:     Rate and Rhythm: Normal rate and regular rhythm.  Pulmonary:     Effort: Pulmonary effort is normal.     Comments: Coarse breath sounds bilaterally, no wheezing Abdominal:     General: Bowel sounds are normal. There is no distension.     Palpations: Abdomen is soft.     Tenderness: There is no abdominal tenderness.  Musculoskeletal:        General: Normal range of motion.     Cervical back: Normal range of motion.  Skin:    General: Skin is warm and dry.  Neurological:     Comments: Follows some commands.  No obvious focal deficits.  Encephalopathy precludes full exam. Moves all 4 extremities       Consultants:    Procedures:    Data Reviewed: Results for orders placed or performed during the hospital encounter of 06/18/22 (from the past 24 hour(s))  Glucose, capillary     Status: Abnormal   Collection Time: 06/21/22  9:18 PM  Result Value Ref Range   Glucose-Capillary 216 (H) 70 - 99 mg/dL  CBC with Differential/Platelet     Status: Abnormal   Collection Time: 06/22/22  5:09 AM  Result Value Ref Range   WBC 5.4 4.0 - 10.5 K/uL   RBC 3.85 (L) 3.87 - 5.11 MIL/uL   Hemoglobin 11.4 (L) 12.0 - 15.0 g/dL   HCT 14/12/23 (L) 34.1 - 93.7 %   MCV 91.4 80.0 - 100.0 fL   MCH 29.6 26.0 - 34.0 pg   MCHC 32.4 30.0 - 36.0 g/dL   RDW 90.2 40.9 - 73.5 %   Platelets 369 150 - 400 K/uL   nRBC 0.0 0.0 - 0.2 %   Neutrophils Relative % 58 %   Neutro Abs 3.1 1.7 - 7.7 K/uL   Lymphocytes Relative 27 %   Lymphs Abs 1.5 0.7 - 4.0 K/uL   Monocytes Relative 10 %   Monocytes Absolute 0.5 0.1 - 1.0 K/uL   Eosinophils Relative 3 %   Eosinophils Absolute 0.2 0.0 - 0.5 K/uL   Basophils  Relative 1 %   Basophils Absolute 0.1 0.0 - 0.1 K/uL   Immature Granulocytes 1 %   Abs Immature Granulocytes 0.06 0.00 - 0.07 K/uL  Magnesium     Status: None   Collection Time: 06/22/22  5:09 AM  Result Value Ref Range   Magnesium 2.1 1.7 - 2.4 mg/dL  Basic metabolic panel     Status: Abnormal   Collection Time: 06/22/22  5:09 AM  Result Value Ref Range   Sodium 140 135 - 145 mmol/L   Potassium 3.6 3.5 - 5.1 mmol/L   Chloride 105 98 - 111 mmol/L   CO2 24 22 - 32 mmol/L   Glucose, Bld 240 (H) 70 - 99  mg/dL   BUN 20 8 - 23 mg/dL   Creatinine, Ser 8.54 0.44 - 1.00 mg/dL   Calcium 8.6 (L) 8.9 - 10.3 mg/dL   GFR, Estimated >62 >70 mL/min   Anion gap 11 5 - 15  Glucose, capillary     Status: Abnormal   Collection Time: 06/22/22  8:00 AM  Result Value Ref Range   Glucose-Capillary 170 (H) 70 - 99 mg/dL  Glucose, capillary     Status: Abnormal   Collection Time: 06/22/22 11:26 AM  Result Value Ref Range   Glucose-Capillary 252 (H) 70 - 99 mg/dL    I have Reviewed nursing notes, Vitals, and Lab results since pt's last encounter. Pertinent lab results : see above I have ordered test including BMP, CBC, Mg I have reviewed the last note from staff over past 24 hours I have discussed pt's care plan and test results with nursing staff, case manager  Time spent: Greater than 50% of the 55 minute visit was spent in counseling/coordination of care for the patient as laid out in the A&P.    LOS: 3 days   Lewie Chamber, MD [facility] 06/22/2022, 4:59 PM

## 2022-06-23 DIAGNOSIS — F1010 Alcohol abuse, uncomplicated: Secondary | ICD-10-CM | POA: Diagnosis not present

## 2022-06-23 DIAGNOSIS — G9341 Metabolic encephalopathy: Secondary | ICD-10-CM | POA: Diagnosis not present

## 2022-06-23 DIAGNOSIS — I152 Hypertension secondary to endocrine disorders: Secondary | ICD-10-CM

## 2022-06-23 DIAGNOSIS — E1159 Type 2 diabetes mellitus with other circulatory complications: Secondary | ICD-10-CM | POA: Diagnosis not present

## 2022-06-23 DIAGNOSIS — J189 Pneumonia, unspecified organism: Secondary | ICD-10-CM | POA: Diagnosis not present

## 2022-06-23 LAB — CBC WITH DIFFERENTIAL/PLATELET
Abs Immature Granulocytes: 0.05 10*3/uL (ref 0.00–0.07)
Basophils Absolute: 0.1 10*3/uL (ref 0.0–0.1)
Basophils Relative: 1 %
Eosinophils Absolute: 0.2 10*3/uL (ref 0.0–0.5)
Eosinophils Relative: 4 %
HCT: 34.5 % — ABNORMAL LOW (ref 36.0–46.0)
Hemoglobin: 10.9 g/dL — ABNORMAL LOW (ref 12.0–15.0)
Immature Granulocytes: 1 %
Lymphocytes Relative: 26 %
Lymphs Abs: 1.3 10*3/uL (ref 0.7–4.0)
MCH: 29.7 pg (ref 26.0–34.0)
MCHC: 31.6 g/dL (ref 30.0–36.0)
MCV: 94 fL (ref 80.0–100.0)
Monocytes Absolute: 0.4 10*3/uL (ref 0.1–1.0)
Monocytes Relative: 8 %
Neutro Abs: 3 10*3/uL (ref 1.7–7.7)
Neutrophils Relative %: 60 %
Platelets: 392 10*3/uL (ref 150–400)
RBC: 3.67 MIL/uL — ABNORMAL LOW (ref 3.87–5.11)
RDW: 11.9 % (ref 11.5–15.5)
WBC: 4.9 10*3/uL (ref 4.0–10.5)
nRBC: 0 % (ref 0.0–0.2)

## 2022-06-23 LAB — BASIC METABOLIC PANEL
Anion gap: 9 (ref 5–15)
BUN: 15 mg/dL (ref 8–23)
CO2: 26 mmol/L (ref 22–32)
Calcium: 8.9 mg/dL (ref 8.9–10.3)
Chloride: 108 mmol/L (ref 98–111)
Creatinine, Ser: 0.8 mg/dL (ref 0.44–1.00)
GFR, Estimated: >60 mL/min (ref >60)
Glucose, Bld: 182 mg/dL — ABNORMAL HIGH (ref 70–99)
Potassium: 4.1 mmol/L (ref 3.5–5.1)
Sodium: 143 mmol/L (ref 135–145)

## 2022-06-23 LAB — LEGIONELLA PNEUMOPHILA SEROGP 1 UR AG: L. pneumophila Serogp 1 Ur Ag: NEGATIVE

## 2022-06-23 LAB — GLUCOSE, CAPILLARY
Glucose-Capillary: 172 mg/dL — ABNORMAL HIGH (ref 70–99)
Glucose-Capillary: 247 mg/dL — ABNORMAL HIGH (ref 70–99)
Glucose-Capillary: 154 mg/dL — ABNORMAL HIGH (ref 70–99)
Glucose-Capillary: 243 mg/dL — ABNORMAL HIGH (ref 70–99)

## 2022-06-23 LAB — VITAMIN B1: Vitamin B1 (Thiamine): 217.4 nmol/L — ABNORMAL HIGH (ref 66.5–200.0)

## 2022-06-23 LAB — MAGNESIUM: Magnesium: 2.1 mg/dL (ref 1.7–2.4)

## 2022-06-23 NOTE — Progress Notes (Signed)
[facility]                                                                               Lindsey Melton, is a 71 y.o. female, DOB - 17-Apr-1951, [id number] Admit date - 06/18/2022    Outpatient Primary MD for the patient is Hemberg, Karie Schwalbe, NP  LOS - 4  days    Brief summary   Lindsey Melton is a 71 yo female with PMH depression/anxiety, arthritis, DM II, COPD, HLD, HTN, substance use (alcohol, crack cocaine), bipolar.  She presented with altered mentation. She underwent workup on admission. CT head notable for diffuse atrophy otherwise no acute abnormalities.  CXR showed mild patchy airspace disease notably at the right lung base. Lab work relatively unremarkable. UA was noted with large LE, negative nitrite, greater than 50 WBC, rare bacteria. Respiratory swabs were positive for COVID and parainfluenza.  She was started on Paxlovid and admitted for further monitoring.   Assessment & Plan    Assessment and Plan: * Acute metabolic encephalopathy Possibly from UTI and pneumonia.  Appears to have resolved. She is alert and answering questions appropriately.    Pneumonia due to Covid and Parainfluenza infections - CXR noted with patchy interstitial opacities, more prominent in the right lung base - Etiology likely viral given COVID-positive and parainfluenza positive -Unfortunately, patient has refused most of her Paxlovid doses; clinically she is stable; hold off on transitioning to remdesivir and will monitor clinically for now off of treatment - PCT negative,  -Currently on room air, hold off on steroids  Physical deconditioning - PT/OT eval recommending SNF, currently waiting for a bed at SNF.   UTI (urinary tract infection) - Given gross confusion, treating for presumed UTI as well - UA noted with large LE, negative nitrite, greater than 50 WBC, rare bacteria -Urine culture growing Gardnerella - complete the course of flagyl.   Alcohol abuse -  continue CIWA - CIWA IS 2 TODAY.   Bipolar 1 disorder, depressed, moderate (HCC) - Continue Abilify, Lamictal, Effexor  Hypertension associated with diabetes ([place]) - optimal.  Continue with homem eds.   HLD (hyperlipidemia) - Hold statin for now  Type 2 diabetes mellitus (HCC) CBG (last 3)  Recent Labs    06/22/22 2210 06/23/22 0727 06/23/22 1145  GLUCAP 173* 172* 247*   Resume SSI.           Estimated body mass index is 30.66 kg/m as calculated from the following:   Height as of this encounter: 5\' 1"  (1.549 m).   Weight as of this encounter: 73.6 kg.  Code Status: full code.  DVT Prophylaxis:  enoxaparin (LOVENOX) injection 40 mg Start: 06/19/22 1000   Level of Care: Level of care: Telemetry Family Communication: none at bedside.   Disposition Plan:     Remains inpatient appropriate:  waiting for SNF   Procedures:  None.   Consultants:   None.   Antimicrobials:   Anti-infectives (From admission, onward)    Start     Dose/Rate Route Frequency Ordered Stop   06/21/22 1730  metroNIDAZOLE (FLAGYL) IVPB 500 mg        500 mg 100 mL/hr over 60  Minutes Intravenous Every 12 hours 06/21/22 1644     06/20/22 0200  cefTRIAXone (ROCEPHIN) 1 g in sodium chloride 0.9 % 100 mL IVPB  Status:  Discontinued        1 g 200 mL/hr over 30 Minutes Intravenous Every 24 hours 06/19/22 0239 06/19/22 1225   06/20/22 0200  cefTRIAXone (ROCEPHIN) 1 g in sodium chloride 0.9 % 100 mL IVPB  Status:  Discontinued        1 g 200 mL/hr over 30 Minutes Intravenous Every 24 hours 06/19/22 1225 06/21/22 1644   06/19/22 1000  nirmatrelvir/ritonavir EUA (PAXLOVID) 3 tablet  Status:  Discontinued        3 tablet Oral 2 times daily 06/19/22 0821 06/21/22 1206   06/19/22 0400  azithromycin (ZITHROMAX) 500 mg in sodium chloride 0.9 % 250 mL IVPB  Status:  Discontinued        500 mg 250 mL/hr over 60 Minutes Intravenous Every 24 hours 06/19/22 0308 06/19/22 1225   06/19/22 0230  azithromycin  (ZITHROMAX) tablet 500 mg  Status:  Discontinued        500 mg Oral  Once 06/19/22 0216 06/19/22 0235   06/19/22 0130  cefTRIAXone (ROCEPHIN) 1 g in sodium chloride 0.9 % 100 mL IVPB        1 g 200 mL/hr over 30 Minutes Intravenous  Once 06/19/22 0122 06/19/22 0230        Medications  Scheduled Meds:  ARIPiprazole  5 mg Oral Daily   brimonidine  1 drop Both Eyes TID   vitamin B-12  1,000 mcg Oral Daily   dorzolamide-timolol  1 drop Both Eyes BID   enoxaparin (LOVENOX) injection  40 mg Subcutaneous Q24H   folic acid  1 mg Oral Daily   insulin aspart  0-15 Units Subcutaneous TID WC   insulin aspart  0-5 Units Subcutaneous QHS   lamoTRIgine  250 mg Oral Daily   latanoprost  1 drop Both Eyes QHS   lisinopril  40 mg Oral Daily   mometasone-formoterol  2 puff Inhalation BID   multivitamin with minerals  1 tablet Oral Daily   thiamine  100 mg Oral Daily   Or   thiamine  100 mg Intravenous Daily   umeclidinium bromide  1 puff Inhalation Daily   venlafaxine XR  150 mg Oral Q breakfast   Continuous Infusions:  metronidazole 500 mg (06/23/22 0525)   PRN Meds:.acetaminophen **OR** acetaminophen, haloperidol lactate, oxyCODONE    Subjective:   Lindsey Melton was seen and examined today.  No new complaints, wants to go home   Objective:   Vitals:   06/22/22 1349 06/22/22 1920 06/22/22 2042 06/23/22 0516  BP: 135/73 127/87  (!) 149/87  Pulse: 81 81  82  Resp: 18 18  16   Temp: 98.6 F (37 C) 97.7 F (36.5 C)  98.2 F (36.8 C)  TempSrc: Oral Oral  Oral  SpO2: 98% 99% 99% 99%  Weight:      Height:       No intake or output data in the 24 hours ending 06/23/22 1307 Filed Weights   06/18/22 2125 06/19/22 1419  Weight: 72.6 kg 73.6 kg     Exam General exam: Appears calm and comfortable  Respiratory system: Clear to auscultation. Respiratory effort normal. Cardiovascular system: S1 & S2 heard, RRR. No JVD,  Gastrointestinal system: Abdomen is nondistended, soft and  nontender.  Central nervous system: Alert and oriented. No focal neurological deficits. Extremities: Symmetric 5 x 5 power.  Skin: No rashes, lesions or ulcers Psychiatry: Mood & affect appropriate.     Data Reviewed:  I have personally reviewed following labs and imaging studies   CBC Lab Results  Component Value Date   WBC 4.9 06/23/2022   RBC 3.67 (L) 06/23/2022   HGB 10.9 (L) 06/23/2022   HCT 34.5 (L) 06/23/2022   MCV 94.0 06/23/2022   MCH 29.7 06/23/2022   PLT 392 06/23/2022   MCHC 31.6 06/23/2022   RDW 11.9 06/23/2022   LYMPHSABS 1.3 06/23/2022   MONOABS 0.4 06/23/2022   EOSABS 0.2 06/23/2022   BASOSABS 0.1 [id number]     Last metabolic panel Lab Results  Component Value Date   NA 143 06/23/2022   K 4.1 06/23/2022   CL 108 06/23/2022   CO2 26 06/23/2022   BUN 15 06/23/2022   CREATININE 0.80 06/23/2022   GLUCOSE 182 (H) 06/23/2022   GFRNONAA >60 06/23/2022   GFRAA >60 10/14/2019   CALCIUM 8.9 06/23/2022   PHOS 4.5 09/11/2015   PROT 6.3 (L) 06/21/2022   ALBUMIN 3.2 (L) 06/21/2022   LABGLOB 2.4 12/26/2015   AGRATIO 1.8 12/26/2015   BILITOT 0.3 06/21/2022   ALKPHOS 120 06/21/2022   AST 25 06/21/2022   ALT 20 06/21/2022   ANIONGAP 9 06/23/2022    CBG (last 3)  Recent Labs    06/22/22 2210 06/23/22 0727 06/23/22 1145  GLUCAP 173* 172* 247*      Coagulation Profile: No results for input(s): "INR", "PROTIME" in the last 168 hours.   Radiology Studies: No results found.     Hosie Poisson M.D. [facility] 06/23/2022, 1:07 PM  Available via Epic secure chat 7am-7pm After 7 pm, please refer to night coverage provider listed on amion.

## 2022-06-23 NOTE — Progress Notes (Signed)
Physical Therapy Treatment Patient Details Name: Lindsey Melton MRN: [id number] DOB: [birth year] Today's Date: 06/23/2022   History of Present Illness 71 yo female admitted with altered mentation. Pt with acute metabolic encephalopathy, pneumonia due to Covid and parainfluenza infections, UTI. PMH depression/anxiety, arthritis, DM II, COPD, HLD, HTN, substance use (alcohol, crack cocaine), bipolar, dementia (per daughter).    PT Comments    The patient is much more alert, able to ambulate in room using Rw and min assistance. cSPO2 on RA 965 so did not replace O2, RN notified.   Recommendations for follow up therapy are one component of a multi-disciplinary discharge planning process, led by the attending physician.  Recommendations may be updated based on patient status, additional functional criteria and insurance authorization.  Follow Up Recommendations  Skilled nursing-short term rehab (<3 hours/day) Can patient physically be transported by private vehicle: Yes   Assistance Recommended at Discharge Frequent or constant Supervision/Assistance  Patient can return home with the following A little help with walking and/or transfers;A little help with bathing/dressing/bathroom;Assistance with cooking/housework   Equipment Recommendations       Recommendations for Other Services       Precautions / Restrictions Precautions Precautions: Fall Precaution Comments: covid     Mobility  Bed Mobility   Bed Mobility: Supine to Sit     Supine to sit: Supervision          Transfers   Equipment used: Rolling walker (2 wheels) Transfers: Sit to/from Stand Sit to Stand: Min guard                Ambulation/Gait Ambulation/Gait assistance: Editor, commissioning (Feet): 20 Feet (x 2) Assistive device: Rolling walker (2 wheels) Gait Pattern/deviations: Step-through pattern, Staggering right, Staggering left Gait velocity: decr     General Gait Details: able to  ambulate to BR and back to recliner, unsteady at times.   Stairs             Wheelchair Mobility    Modified Rankin (Stroke Patients Only)       Balance   Sitting-balance support: Feet supported Sitting balance-Leahy Scale: Good     Standing balance support: Single extremity supported, During functional activity Standing balance-Leahy Scale: Fair Standing balance comment: during  hygiene post voiding                            Cognition Arousal/Alertness: Awake/alert Behavior During Therapy: WFL for tasks assessed/performed Overall Cognitive Status: Impaired/Different from baseline Area of Impairment: Orientation, Attention, Memory, Following commands, Safety/judgement, Awareness, Problem solving                 Orientation Level: Disoriented to, Time, Situation Current Attention Level: Alternating   Following Commands: Follows multi-step commands consistently   Awareness: Emergent Problem Solving: Slow processing, Requires verbal cues, Difficulty sequencing          Exercises      General Comments        Pertinent Vitals/Pain Pain Assessment Pain Assessment: No/denies pain    Home Living                          Prior Function            PT Goals (current goals can now be found in the care plan section) Progress towards PT goals: Progressing toward goals    Frequency    Min 2X/week  PT Plan Current plan remains appropriate    Co-evaluation              AM-PAC PT "6 Clicks" Mobility   Outcome Measure  Help needed turning from your back to your side while in a flat bed without using bedrails?: None Help needed moving from lying on your back to sitting on the side of a flat bed without using bedrails?: None Help needed moving to and from a bed to a chair (including a wheelchair)?: A Little Help needed standing up from a chair using your arms (e.g., wheelchair or bedside chair)?: A Little Help  needed to walk in hospital room?: A Little Help needed climbing 3-5 steps with a railing? : A Lot 6 Click Score: 19    End of Session Equipment Utilized During Treatment: Gait belt Activity Tolerance: Patient tolerated treatment well Patient left: in chair;with call bell/phone within reach;with chair alarm set Nurse Communication: Mobility status PT Visit Diagnosis: Difficulty in walking, not elsewhere classified (R26.2);Muscle weakness (generalized) (M62.81)     Time: 4765-4650 PT Time Calculation (min) (ACUTE ONLY): 33 min  Charges:  $Gait Training: 8-22 mins $Self Care/Home Management: 8-22                     Blanchard Kelch PT Acute Rehabilitation Services Office [phone] Weekend pager-[phone]    Rada Hay 06/23/2022, 5:00 PM

## 2022-06-24 LAB — CBC WITH DIFFERENTIAL/PLATELET
Abs Immature Granulocytes: 0.05 10*3/uL (ref 0.00–0.07)
Basophils Absolute: 0.1 10*3/uL (ref 0.0–0.1)
Basophils Relative: 1 %
Eosinophils Absolute: 0.2 10*3/uL (ref 0.0–0.5)
Eosinophils Relative: 3 %
HCT: 35.8 % — ABNORMAL LOW (ref 36.0–46.0)
Hemoglobin: 11.4 g/dL — ABNORMAL LOW (ref 12.0–15.0)
Immature Granulocytes: 1 %
Lymphocytes Relative: 23 %
Lymphs Abs: 1.5 10*3/uL (ref 0.7–4.0)
MCH: 29.5 pg (ref 26.0–34.0)
MCHC: 31.8 g/dL (ref 30.0–36.0)
MCV: 92.5 fL (ref 80.0–100.0)
Monocytes Absolute: 0.6 10*3/uL (ref 0.1–1.0)
Monocytes Relative: 10 %
Neutro Abs: 4 10*3/uL (ref 1.7–7.7)
Neutrophils Relative %: 62 %
Platelets: 390 10*3/uL (ref 150–400)
RBC: 3.87 MIL/uL (ref 3.87–5.11)
RDW: 11.9 % (ref 11.5–15.5)
WBC: 6.5 10*3/uL (ref 4.0–10.5)
nRBC: 0 % (ref 0.0–0.2)

## 2022-06-24 LAB — GLUCOSE, CAPILLARY
Glucose-Capillary: 177 mg/dL — ABNORMAL HIGH (ref 70–99)
Glucose-Capillary: 263 mg/dL — ABNORMAL HIGH (ref 70–99)
Glucose-Capillary: 148 mg/dL — ABNORMAL HIGH (ref 70–99)
Glucose-Capillary: 226 mg/dL — ABNORMAL HIGH (ref 70–99)

## 2022-06-24 LAB — BASIC METABOLIC PANEL
Anion gap: 8 (ref 5–15)
BUN: 17 mg/dL (ref 8–23)
CO2: 27 mmol/L (ref 22–32)
Calcium: 9.2 mg/dL (ref 8.9–10.3)
Chloride: 108 mmol/L (ref 98–111)
Creatinine, Ser: 0.91 mg/dL (ref 0.44–1.00)
GFR, Estimated: >60 mL/min (ref >60)
Glucose, Bld: 184 mg/dL — ABNORMAL HIGH (ref 70–99)
Potassium: 4.5 mmol/L (ref 3.5–5.1)
Sodium: 143 mmol/L (ref 135–145)

## 2022-06-24 LAB — MAGNESIUM: Magnesium: 2.1 mg/dL (ref 1.7–2.4)

## 2022-06-24 MED ORDER — METRONIDAZOLE 500 MG PO TABS
500.0000 mg | ORAL_TABLET | Freq: Two times a day (BID) | ORAL | Status: DC
  Administered 2022-06-24 – 2022-06-29 (×10): 500 mg via ORAL
  Filled 2022-06-24 (×10): qty 1

## 2022-06-24 NOTE — Progress Notes (Signed)
Occupational Therapy Treatment Patient Details Name: Lindsey Melton MRN: [id number] DOB: Feb 11, 1951 Today's Date: 06/24/2022   History of present illness 71 yo female admitted with altered mentation. Pt with acute metabolic encephalopathy, pneumonia due to Covid and parainfluenza infections, UTI. PMH depression/anxiety, arthritis, DM II, COPD, HLD, HTN, substance use (alcohol, crack cocaine), bipolar, dementia (per daughter).   OT comments  Patient exhibiting improved cognition today and is alert and oriented - not to specific date but knew the day of the week that someone had told her earlier. Knew the year, the month, and her current situation with having COVID.  Unsure of baseline cognition - in regards to higher level cognition and impulsivity and ability to manage meds without assistance. Today she was min guard to supervision to ambulate to bathroom, perform toileting and stand at sink for grooming. She had no overt loss of balance. Will continue to follow.   Recommendations for follow up therapy are one component of a multi-disciplinary discharge planning process, led by the attending physician.  Recommendations may be updated based on patient status, additional functional criteria and insurance authorization.    Follow Up Recommendations  Skilled nursing-short term rehab (<3 hours/day)     Assistance Recommended at Discharge Frequent or constant Supervision/Assistance  Patient can return home with the following  A little help with walking and/or transfers;A little help with bathing/dressing/bathroom;Assistance with cooking/housework;Direct supervision/assist for medications management;Direct supervision/assist for financial management;Assist for transportation;Help with stairs or ramp for entrance   Equipment Recommendations  None recommended by OT    Recommendations for Other Services      Precautions / Restrictions Precautions Precautions: Fall Restrictions Weight Bearing  Restrictions: No       Mobility Bed Mobility                    Transfers                         Balance Overall balance assessment: Mild deficits observed, not formally tested         Standing balance support: Reliant on assistive device for balance, During functional activity Standing balance-Leahy Scale: Fair                             ADL either performed or assessed with clinical judgement   ADL Overall ADL's : Needs assistance/impaired     Grooming: Wash/dry face;Wash/dry hands;Standing;Min guard Grooming Details (indicate cue type and reason): at sink                 Toilet Transfer: Supervision/safety;Regular Toilet;Rolling walker (2 wheels)   Toileting- Clothing Manipulation and Hygiene: Supervision/safety;Sit to/from stand       Functional mobility during ADLs: Min guard;Rolling walker (2 wheels) General ADL Comments: Min guard to ambulate to batroom, perform toilet transfer, stand at sink for grooming and return to room to sit in recilner.    Extremity/Trunk Assessment Upper Extremity Assessment Upper Extremity Assessment: Overall WFL for tasks assessed   Lower Extremity Assessment Lower Extremity Assessment: Defer to PT evaluation   Cervical / Trunk Assessment Cervical / Trunk Assessment: Normal    Vision   Vision Assessment?: No apparent visual deficits   Perception     Praxis      Cognition Arousal/Alertness: Awake/alert Behavior During Therapy: WFL for tasks assessed/performed Overall Cognitive Status: Within Functional Limits for tasks assessed  General Comments: Alert to self, birthdate, month, year, situation. did not know the specific date.        Exercises      Shoulder Instructions       General Comments      Pertinent Vitals/ Pain       Pain Assessment Pain Assessment: No/denies pain  Home Living                                           Prior Functioning/Environment              Frequency  Min 2X/week        Progress Toward Goals  OT Goals(current goals can now be found in the care plan section)  Progress towards OT goals: Progressing toward goals  Acute Rehab OT Goals Patient Stated Goal: to go home OT Goal Formulation: With patient Time For Goal Achievement: 07/04/22 Potential to Achieve Goals: Good  Plan Discharge plan remains appropriate    Co-evaluation          OT goals addressed during session: ADL's and self-care      AM-PAC OT "6 Clicks" Daily Activity     Outcome Measure   Help from another person eating meals?: None Help from another person taking care of personal grooming?: None Help from another person toileting, which includes using toliet, bedpan, or urinal?: A Little Help from another person bathing (including washing, rinsing, drying)?: A Little Help from another person to put on and taking off regular upper body clothing?: A Little Help from another person to put on and taking off regular lower body clothing?: A Little 6 Click Score: 20    End of Session Equipment Utilized During Treatment: Rolling walker (2 wheels)  OT Visit Diagnosis: Unsteadiness on feet (R26.81);Repeated falls (R29.6);Muscle weakness (generalized) (M62.81);Other symptoms and signs involving cognitive function   Activity Tolerance Patient tolerated treatment well   Patient Left in chair;with call bell/phone within reach;with chair alarm set   Nurse Communication Mobility status        Time: [id number] OT Time Calculation (min): 17 min  Charges: OT General Charges $OT Visit: 1 Visit OT Treatments $Self Care/Home Management : 8-22 mins  Gustavo Lah, OTR/L [place]  Office [phone]   Lenward Chancellor 06/24/2022, 12:51 PM

## 2022-06-24 NOTE — Progress Notes (Signed)
Mobility Specialist - Progress Note   06/24/22 1230  Mobility  Activity Ambulated with assistance in hallway  Level of Assistance Standby assist, set-up cues, supervision of patient - no hands on  Assistive Device Front wheel walker  Distance Ambulated (ft) 220 ft  Activity Response Tolerated well  Mobility Referral Yes  $Mobility charge 1 Mobility   Very pleasant pt received in bed and agreed to ambulation. Had no c/o pain only discomfort was the mask. No instances of LOB and pt returned to bed with all needs met and alarm on.  Roderick Pee Mobility Specialist

## 2022-06-24 NOTE — Inpatient Diabetes Management (Signed)
Inpatient Diabetes Program Recommendations  AACE/ADA: New Consensus Statement on Inpatient Glycemic Control (2015)  Target Ranges:  Prepandial:   less than 140 mg/dL      Peak postprandial:   less than 180 mg/dL (1-2 hours)      Critically ill patients:  140 - 180 mg/dL    Latest Reference Range & Units 06/19/22 04:17  Hemoglobin A1C 4.8 - 5.6 % 10.8 (H)  263 mg/dl  (H): Data is abnormally high  Latest Reference Range & Units 06/23/22 07:27 06/23/22 11:45 06/23/22 16:13 06/23/22 22:06  Glucose-Capillary 70 - 99 mg/dL 166 (H)  3 units Novolog  247 (H)  5 units Novolog  154 (H)  3 units Novolog  243 (H)  2 units Novolog   (H): Data is abnormally high  Latest Reference Range & Units 06/24/22 08:32 06/24/22 12:38  Glucose-Capillary 70 - 99 mg/dL 060 (H)  3 units Novolog  263 (H)  8 units Novolog   (H): Data is abnormally high   Admit with:  AMS UTI and Penumonia COVID and Influenza +  History: DM2, Alcohol and Crack Cocaine Abuse  Home DM Meds: Lantus 83 units QHS  Current Orders: Novolog Moderate Correction Scale/ SSI (0-15 units) TID AC + HS    MD- Note afternoon CBGs are elevated  Please consider adding Novolog Meal Coverage:  Novolog 4 units TID with meals HOLD if pt NPO HOLD if pt eats <50% meals    --Will follow patient during hospitalization--  Ambrose Finland RN, MSN, CDCES Diabetes Coordinator Inpatient Glycemic Control Team Team Pager: [phone] (8a-5p)

## 2022-06-24 NOTE — Progress Notes (Signed)
[facility]                                                                               Lindsey Melton, is a 71 y.o. female, DOB - 12-Nov-1950, TTS:[id number] Admit date - 06/18/2022    Outpatient Primary MD for the patient is Hemberg, Ruby Cola, NP  LOS - 5  days    Brief summary   Lindsey Melton is a 71 yo female with PMH depression/anxiety, arthritis, DM II, COPD, HLD, HTN, substance use (alcohol, crack cocaine), bipolar.  She presented with altered mentation. She underwent workup on admission. CT head notable for diffuse atrophy otherwise no acute abnormalities.  CXR showed mild patchy airspace disease notably at the right lung base. Lab work relatively unremarkable. UA was noted with large LE, negative nitrite, greater than 50 WBC, rare bacteria. Respiratory swabs were positive for COVID and parainfluenza.  She was started on Paxlovid and admitted for further monitoring. She is stable for discharge.   Assessment & Plan    Assessment and Plan: * Acute metabolic encephalopathy Possibly from UTI and pneumonia.  Appears to have resolved. She is alert and answering questions appropriately.    Pneumonia due to Covid and Parainfluenza infections - CXR noted with patchy interstitial opacities, more prominent in the right lung base - Etiology likely viral given COVID-positive and parainfluenza positive -Unfortunately, patient has refused most of her Paxlovid doses; clinically she is stable; hold off on transitioning to remdesivir and will monitor clinically for now off of treatment - PCT negative,  -Currently on room air, denies any sob or cough.  Physical deconditioning - PT/OT eval recommending SNF, currently waiting for a bed at SNF.   UTI (urinary tract infection) - Given gross confusion, treating for presumed UTI as well - UA noted with large LE, negative nitrite, greater than 50 WBC, rare bacteria -Urine culture growing Gardnerella - complete the course of  flagyl.   Alcohol abuse - no withdrawal signs   Bipolar 1 disorder, depressed, moderate (HCC) - Continue Abilify, Lamictal, Effexor  Hypertension associated with diabetes (HCC) Well controlled.    HLD (hyperlipidemia) - Hold statin for now  Type 2 diabetes mellitus (HCC) CBG (last 3)  Recent Labs    06/23/22 2206 06/24/22 0832 06/24/22 1238  GLUCAP 243* 177* 263*    Resume SSI.           Estimated body mass index is 30.66 kg/m as calculated from the following:   Height as of this encounter: 5\' 1"  (1.549 m).   Weight as of this encounter: 73.6 kg.  Code Status: full code.  DVT Prophylaxis:  enoxaparin (LOVENOX) injection 40 mg Start: 06/19/22 1000   Level of Care: Level of care: Med-Surg Family Communication: none at bedside.   Disposition Plan:     Remains inpatient appropriate:  waiting for SNF   Procedures:  None.   Consultants:   None.   Antimicrobials:   Anti-infectives (From admission, onward)    Start     Dose/Rate Route Frequency Ordered Stop   06/21/22 1730  metroNIDAZOLE (FLAGYL) IVPB 500 mg        500 mg 100 mL/hr over 60 Minutes  Intravenous Every 12 hours 06/21/22 1644     06/20/22 0200  cefTRIAXone (ROCEPHIN) 1 g in sodium chloride 0.9 % 100 mL IVPB  Status:  Discontinued        1 g 200 mL/hr over 30 Minutes Intravenous Every 24 hours 06/19/22 0239 06/19/22 1225   06/20/22 0200  cefTRIAXone (ROCEPHIN) 1 g in sodium chloride 0.9 % 100 mL IVPB  Status:  Discontinued        1 g 200 mL/hr over 30 Minutes Intravenous Every 24 hours 06/19/22 1225 06/21/22 1644   06/19/22 1000  nirmatrelvir/ritonavir EUA (PAXLOVID) 3 tablet  Status:  Discontinued        3 tablet Oral 2 times daily 06/19/22 0821 06/21/22 1206   06/19/22 0400  azithromycin (ZITHROMAX) 500 mg in sodium chloride 0.9 % 250 mL IVPB  Status:  Discontinued        500 mg 250 mL/hr over 60 Minutes Intravenous Every 24 hours 06/19/22 0308 06/19/22 1225   06/19/22 0230  azithromycin  (ZITHROMAX) tablet 500 mg  Status:  Discontinued        500 mg Oral  Once 06/19/22 0216 06/19/22 0235   06/19/22 0130  cefTRIAXone (ROCEPHIN) 1 g in sodium chloride 0.9 % 100 mL IVPB        1 g 200 mL/hr over 30 Minutes Intravenous  Once 06/19/22 0122 06/19/22 0230        Medications  Scheduled Meds:  ARIPiprazole  5 mg Oral Daily   brimonidine  1 drop Both Eyes TID   vitamin B-12  1,000 mcg Oral Daily   dorzolamide-timolol  1 drop Both Eyes BID   enoxaparin (LOVENOX) injection  40 mg Subcutaneous Q24H   folic acid  1 mg Oral Daily   insulin aspart  0-15 Units Subcutaneous TID WC   insulin aspart  0-5 Units Subcutaneous QHS   lamoTRIgine  250 mg Oral Daily   latanoprost  1 drop Both Eyes QHS   lisinopril  40 mg Oral Daily   mometasone-formoterol  2 puff Inhalation BID   multivitamin with minerals  1 tablet Oral Daily   thiamine  100 mg Oral Daily   Or   thiamine  100 mg Intravenous Daily   umeclidinium bromide  1 puff Inhalation Daily   venlafaxine XR  150 mg Oral Q breakfast   Continuous Infusions:  metronidazole 500 mg (06/24/22 0518)   PRN Meds:.acetaminophen **OR** acetaminophen, haloperidol lactate, oxyCODONE    Subjective:   Lindsey Melton was seen and examined today.  No new complaints.   Objective:   Vitals:   06/23/22 1617 06/23/22 2005 06/24/22 0229 06/24/22 1348  BP: 133/67 139/75 (!) 176/99 (!) 144/85  Pulse: (!) 54 72 64 73  Resp: 20 20 20 18   Temp: 98.1 F (36.7 C) 97.6 F (36.4 C) 98.2 F (36.8 C) 98.1 F (36.7 C)  TempSrc:  Oral Oral Oral  SpO2: 96% 98% 97% 96%  Weight:      Height:        Intake/Output Summary (Last 24 hours) at 06/24/2022 1551 Last data filed at 06/24/2022 1300 Gross per 24 hour  Intake 740 ml  Output --  Net 740 ml   Filed Weights   06/18/22 2125 06/19/22 1419  Weight: 72.6 kg 73.6 kg     Exam General exam: Appears calm and comfortable  Respiratory system: Clear to auscultation. Respiratory effort  normal. Cardiovascular system: S1 & S2 heard, RRR.  No pedal edema. Gastrointestinal system: Abdomen is  nondistended, soft and nontender. Normal bowel sounds heard. Central nervous system: Alert and oriented. No focal neurological deficits. Extremities: Symmetric 5 x 5 power. Skin: No rashes, lesions or ulcers Psychiatry: Mood & affect appropriate.      Data Reviewed:  I have personally reviewed following labs and imaging studies   CBC Lab Results  Component Value Date   WBC 6.5 06/24/2022   RBC 3.87 06/24/2022   HGB 11.4 (L) 06/24/2022   HCT 35.8 (L) 06/24/2022   MCV 92.5 06/24/2022   MCH 29.5 06/24/2022   PLT 390 06/24/2022   MCHC 31.8 06/24/2022   RDW 11.9 06/24/2022   LYMPHSABS 1.5 06/24/2022   MONOABS 0.6 06/24/2022   EOSABS 0.2 06/24/2022   BASOSABS 0.1 06/24/2022     Last metabolic panel Lab Results  Component Value Date   NA 143 06/24/2022   K 4.5 06/24/2022   CL 108 06/24/2022   CO2 27 06/24/2022   BUN 17 06/24/2022   CREATININE 0.91 06/24/2022   GLUCOSE 184 (H) 06/24/2022   GFRNONAA >60 06/24/2022   GFRAA >60 10/14/2019   CALCIUM 9.2 06/24/2022   PHOS 4.5 09/11/2015   PROT 6.3 (L) 06/21/2022   ALBUMIN 3.2 (L) 06/21/2022   LABGLOB 2.4 12/26/2015   AGRATIO 1.8 12/26/2015   BILITOT 0.3 06/21/2022   ALKPHOS 120 06/21/2022   AST 25 06/21/2022   ALT 20 06/21/2022   ANIONGAP 8 06/24/2022    CBG (last 3)  Recent Labs    06/23/22 2206 06/24/22 0832 06/24/22 1238  GLUCAP 243* 177* 263*       Coagulation Profile: No results for input(s): "INR", "PROTIME" in the last 168 hours.   Radiology Studies: No results found.     Kathlen Mody M.D. [facility] 06/24/2022, 3:51 PM  Available via Epic secure chat 7am-7pm After 7 pm, please refer to night coverage provider listed on amion.

## 2022-06-24 NOTE — TOC Progression Note (Signed)
Transition of Care [facility]) - Progression Note    Patient Details  Name: NIKOLETA DADY MRN: [id number] Date of Birth: 1950-11-26  [facility] CM/SW Contact  Otelia Santee, LCSW Phone Number: 06/24/2022, 1:21 PM  Clinical Narrative:    Referral sent to [facility] for review. Per Jonita Albee they are unable to accept until pt is no longer requiring haldol/prn's and no longer requiring physical restraints for at least 48 hours.  TOC will continue to follow for progress.     Expected Discharge Plan: Skilled Nursing Facility Barriers to Discharge: SNF Covid  Expected Discharge Plan and Services Expected Discharge Plan: Skilled Nursing Facility In-house Referral: NA Discharge Planning Services: CM Consult Post Acute Care Choice: Skilled Nursing Facility Living arrangements for the past 2 months: Apartment                 DME Arranged: N/A DME Agency: NA                   Social Determinants of Health (SDOH) Interventions Housing Interventions: Intervention Not Indicated  Readmission Risk Interventions    06/23/2022    8:52 AM 06/21/2022    2:24 PM  Readmission Risk Prevention Plan  Transportation Screening Complete Complete  PCP or Specialist Appt within 5-7 Days Complete Complete  Home Care Screening Complete Complete  Medication Review (RN CM) Complete Complete

## 2022-06-25 DIAGNOSIS — J189 Pneumonia, unspecified organism: Secondary | ICD-10-CM | POA: Diagnosis not present

## 2022-06-25 DIAGNOSIS — E1159 Type 2 diabetes mellitus with other circulatory complications: Secondary | ICD-10-CM | POA: Diagnosis not present

## 2022-06-25 DIAGNOSIS — G9341 Metabolic encephalopathy: Secondary | ICD-10-CM | POA: Diagnosis not present

## 2022-06-25 DIAGNOSIS — F1010 Alcohol abuse, uncomplicated: Secondary | ICD-10-CM | POA: Diagnosis not present

## 2022-06-25 LAB — BASIC METABOLIC PANEL
Anion gap: 10 (ref 5–15)
BUN: 13 mg/dL (ref 8–23)
CO2: 25 mmol/L (ref 22–32)
Calcium: 9.2 mg/dL (ref 8.9–10.3)
Chloride: 107 mmol/L (ref 98–111)
Creatinine, Ser: 0.76 mg/dL (ref 0.44–1.00)
GFR, Estimated: >60 mL/min (ref >60)
Glucose, Bld: 197 mg/dL — ABNORMAL HIGH (ref 70–99)
Potassium: 4.3 mmol/L (ref 3.5–5.1)
Sodium: 142 mmol/L (ref 135–145)

## 2022-06-25 LAB — GLUCOSE, CAPILLARY
Glucose-Capillary: 167 mg/dL — ABNORMAL HIGH (ref 70–99)
Glucose-Capillary: 159 mg/dL — ABNORMAL HIGH (ref 70–99)
Glucose-Capillary: 228 mg/dL — ABNORMAL HIGH (ref 70–99)

## 2022-06-25 LAB — MAGNESIUM: Magnesium: 2.1 mg/dL (ref 1.7–2.4)

## 2022-06-25 NOTE — Progress Notes (Signed)
Mobility Specialist - Progress Note   06/25/22 0947  Mobility  Activity Ambulated with assistance in hallway  Level of Assistance Standby assist, set-up cues, supervision of patient - no hands on  Assistive Device Front wheel walker  Distance Ambulated (ft) 220 ft  Activity Response Tolerated well  Mobility Referral Yes  $Mobility charge 1 Mobility   Pt received in bed and agreed to mobility, had no issues of pain nor discomfort. Pt returned to chair with alarm on and all needs met.   Roderick Pee Mobility Specialist

## 2022-06-25 NOTE — TOC Progression Note (Signed)
Transition of Care [facility]) - Progression Note    Patient Details  Name: Lindsey Melton MRN: [id number] Date of Birth: 1951/07/08  Transition of Care [facility]) CM/SW Contact  Otelia Santee, LCSW Phone Number: 06/25/2022, 2:08 PM  Clinical Narrative:    Referrals for SNF placement have been faxed out. Currently awaiting bed offers.   TOC will follow for progression with PT to determine appropriate DC plan.    Expected Discharge Plan: Skilled Nursing Facility Barriers to Discharge: SNF Covid  Expected Discharge Plan and Services Expected Discharge Plan: Skilled Nursing Facility In-house Referral: NA Discharge Planning Services: CM Consult Post Acute Care Choice: Skilled Nursing Facility Living arrangements for the past 2 months: Apartment                 DME Arranged: N/A DME Agency: NA                   Social Determinants of Health (SDOH) Interventions Food Insecurity Interventions: Other (Comment) (Resource added to AVS) Housing Interventions: Intervention Not Indicated  Readmission Risk Interventions    06/25/2022    9:44 AM 06/23/2022    8:52 AM 06/21/2022    2:24 PM  Readmission Risk Prevention Plan  Transportation Screening Complete Complete Complete  PCP or Specialist Appt within 5-7 Days Complete Complete Complete  Home Care Screening Complete Complete Complete  Medication Review (RN CM) Complete Complete Complete

## 2022-06-25 NOTE — Progress Notes (Addendum)
Physical Therapy Treatment Patient Details Name: Lindsey Melton MRN: [id number] DOB: [birth year] Today's Date: 06/25/2022   History of Present Illness 71 yo female admitted with altered mentation. Pt with acute metabolic encephalopathy, pneumonia due to Covid and parainfluenza infections, UTI. PMH depression/anxiety, arthritis, DM II, COPD, HLD, HTN, substance use (alcohol, crack cocaine), bipolar, dementia (per daughter).    PT Comments    General Comments: AxO x 2 following all directions and able to recall prior to admit but present with mild ST Memory loss and "COVID Fog".  at begining os session, I gave her 3 words to remember.  "Cat, milk, pencil".  She repeated several times.  At end of session, pt was only able to recall cat.  Pt lives home alone and admits to "forgetting things". Assisted OOB to assess balance and mobility.  General transfer comment: prior to admit, pt admits to "furniture walking" and using her walker "outside".  Assisted with amb to bathroom without any AD.  Pt present with poor balance and impaired coordination with heavy lean on foot of bed and doorframe.  Gait is staggered with short shuffled steps.  Esp unstable with turns and back to step to toilet resulted in near fall.  "My legs are weak".  Pt also present with mild dyspnea. General Gait Details: assisted with amb a limited distance in hallway due to increased c/o fatigue "after going to bathroom".  Amb without AD resulted in increased fatigue and unsteadiness.  Gait steps are short and shuffled with very little floor clearance.  Increased balance instability with turns, side stepping and back gait.  HIGH FALL RISK.  Performed a BERG balance test.  Pt scored a low 29/56 indicating HIGH FALL RISK.   Pt lives home alone and will need ST Rehab at SNF prior to safely returning.   Recommendations for follow up therapy are one component of a multi-disciplinary discharge planning process, led by the attending physician.   Recommendations may be updated based on patient status, additional functional criteria and insurance authorization.  Follow Up Recommendations  Skilled nursing-short term rehab (<3 hours/day) Can patient physically be transported by private vehicle: Yes   Assistance Recommended at Discharge Frequent or constant Supervision/Assistance  Patient can return home with the following A little help with walking and/or transfers;A little help with bathing/dressing/bathroom;Assistance with cooking/housework   Equipment Recommendations  None recommended by PT    Recommendations for Other Services       Precautions / Restrictions Precautions Precautions: Fall Precaution Comments: covid Restrictions Weight Bearing Restrictions: No     Mobility  Bed Mobility Overal bed mobility: Needs Assistance Bed Mobility: Supine to Sit     Supine to sit: Supervision     General bed mobility comments: pt self able to get OOB and back into bed.    Transfers Overall transfer level: Needs assistance Equipment used: Rolling walker (2 wheels), None Transfers: Sit to/from Stand, Bed to chair/wheelchair/BSC Sit to Stand: Min guard Stand pivot transfers: Min assist         General transfer comment: prior to admit, pt admits to "furniture walking" and using her walker "outside".  Assisted with amb to bathroom without any AD.  Pt present with poor balance and impaired coordination with heavy lean on foot of bed and doorframe.  Gait is staggered with short shuffled steps.  Esp unstable with turns and back to step to toilet resulted in near fall.  "My legs are weak".  Pt also present with mild dyspnea.  Ambulation/Gait Ambulation/Gait assistance: Min assist, Mod assist Gait Distance (Feet): 45 Feet Assistive device: None Gait Pattern/deviations: Step-through pattern, Staggering right, Staggering left Gait velocity: decreased     General Gait Details: assisted with amb a limited distance in hallway  due to increased c/o fatigue "after going to bathroom".  Amb without AD resulted in increased fatigue and unsteadiness.  Gait steps are short and shuffled with very little floor clearance.  Increased balance instability with turns, side stepping and back gait.  HIGH FALL RISK.  Performed a BERG balance test.   Stairs             Wheelchair Mobility    Modified Rankin (Stroke Patients Only)       Balance Overall balance assessment: Needs assistance, History of Falls   Sitting balance-Leahy Scale: Good       Standing balance-Leahy Scale: Poor Standing balance comment: dynamic                 Standardized Balance Assessment Standardized Balance Assessment : Berg Balance Test Berg Balance Test Sit to Stand: Able to stand  independently using hands Standing Unsupported: Able to stand 2 minutes with supervision Sitting with Back Unsupported but Feet Supported on Floor or Stool: Able to sit safely and securely 2 minutes Stand to Sit: Controls descent by using hands Transfers: Able to transfer safely, definite need of hands Standing Unsupported with Eyes Closed: Able to stand 10 seconds with supervision Standing Ubsupported with Feet Together: Needs help to attain position and unable to hold for 15 seconds From Standing, Reach Forward with Outstretched Arm: Can reach forward >12 cm safely (5") From Standing Position, Pick up Object from Floor: Able to pick up shoe, needs supervision From Standing Position, Turn to Look Behind Over each Shoulder: Looks behind one side only/other side shows less weight shift Turn 360 Degrees: Needs close supervision or verbal cueing Standing Unsupported, Alternately Place Feet on Step/Stool: Needs assistance to keep from falling or unable to try Standing Unsupported, One Foot in Front: Loses balance while stepping or standing Standing on One Leg: Unable to try or needs assist to prevent fall Total Score: 29        Cognition  Arousal/Alertness: Awake/alert Behavior During Therapy: WFL for tasks assessed/performed Overall Cognitive Status: No family/caregiver present to determine baseline cognitive functioning                                 General Comments: AxO x 2 following all directions and able to recall prior to admit but present with mild ST Memory loss and "COVID Fog".  at begining os session, I gave her 3 words to remember.  "Ctt, milk, pencil".  She repeated several times.  At end of session, pt was only able to recall cat.  Pt lives home alone and admits to "forgetting things".        Exercises      General Comments        Pertinent Vitals/Pain Pain Assessment Pain Assessment: No/denies pain    Home Living                          Prior Function            PT Goals (current goals can now be found in the care plan section) Progress towards PT goals: Progressing toward goals    Frequency    Min  2X/week      PT Plan Current plan remains appropriate    Co-evaluation              AM-PAC PT "6 Clicks" Mobility   Outcome Measure  Help needed turning from your back to your side while in a flat bed without using bedrails?: A Little Help needed moving from lying on your back to sitting on the side of a flat bed without using bedrails?: A Little Help needed moving to and from a bed to a chair (including a wheelchair)?: A Little Help needed standing up from a chair using your arms (e.g., wheelchair or bedside chair)?: A Lot Help needed to walk in hospital room?: A Lot Help needed climbing 3-5 steps with a railing? : Total 6 Click Score: 14    End of Session Equipment Utilized During Treatment: Gait belt Activity Tolerance: Patient limited by fatigue Patient left: in bed;with call bell/phone within reach;with bed alarm set Nurse Communication: Mobility status PT Visit Diagnosis: Difficulty in walking, not elsewhere classified (R26.2);Muscle weakness  (generalized) (M62.81)     Time: 3546-5681 PT Time Calculation (min) (ACUTE ONLY): 26 min  Charges:  $Gait Training: 8-22 mins $Therapeutic Activity: 8-22 mins                     Felecia Shelling  PTA Acute  Rehabilitation Services Office M-F          [phone] Weekend pager [phone]

## 2022-06-25 NOTE — Progress Notes (Signed)
[facility]                                                                               Lindsey Melton, is a 71 y.o. female, DOB - 12-28-1950, LDJ:[id number] Admit date - 06/18/2022    Outpatient Primary MD for the patient is Hemberg, Ruby Cola, NP  LOS - 6  days    Brief summary   Lindsey Melton is a 71 yo female with PMH depression/anxiety, arthritis, DM II, COPD, HLD, HTN, substance use (alcohol, crack cocaine), bipolar.  She presented with altered mentation. She underwent workup on admission. CT head notable for diffuse atrophy otherwise no acute abnormalities.  CXR showed mild patchy airspace disease notably at the right lung base. Lab work relatively unremarkable. UA was noted with large LE, negative nitrite, greater than 50 WBC, rare bacteria. Respiratory swabs were positive for COVID and parainfluenza.  She was started on Paxlovid and admitted for further monitoring. She is stable for discharge. Currently waiting for SNF.   Assessment & Plan    Assessment and Plan: * Acute metabolic encephalopathy Possibly from UTI and pneumonia.  Appears to have resolved. She is alert and answering questions appropriately.    Pneumonia due to Covid and Parainfluenza infections - CXR noted with patchy interstitial opacities, more prominent in the right lung base - Etiology likely viral given COVID-positive and parainfluenza positive -Unfortunately, patient has refused most of her Paxlovid doses; clinically she is stable; hold off on transitioning to remdesivir and will monitor clinically for now off of treatment - PCT negative,  -Currently on room air, denies any sob or cough.   Physical deconditioning - PT/OT eval recommending SNF, currently waiting for a bed at SNF.   UTI (urinary tract infection) - Given gross confusion, treating for presumed UTI as well - UA noted with large LE, negative nitrite, greater than 50 WBC, rare bacteria -Urine culture growing  Gardnerella - complete the course of flagyl.  - NO NEW COMPLAINTS.   Alcohol abuse - no withdrawal signs   Bipolar 1 disorder, depressed, moderate (HCC) - Continue Abilify, Lamictal, Effexor  Hypertension associated with diabetes (HCC) Well controlled.    HLD (hyperlipidemia) - Hold statin for now  Type 2 diabetes mellitus (HCC) CBG (last 3)  Recent Labs    06/24/22 2158 06/25/22 0752 06/25/22 1207  GLUCAP 226* 167* 159*    Resume SSI.           Estimated body mass index is 30.66 kg/m as calculated from the following:   Height as of this encounter: 5\' 1"  (1.549 m).   Weight as of this encounter: 73.6 kg.  Code Status: full code.  DVT Prophylaxis:  enoxaparin (LOVENOX) injection 40 mg Start: 06/19/22 1000   Level of Care: Level of care: Med-Surg Family Communication: none at bedside.   Disposition Plan:     Remains inpatient appropriate:  waiting for SNF   Procedures:  None.   Consultants:   None.   Antimicrobials:   Anti-infectives (From admission, onward)    Start     Dose/Rate Route Frequency Ordered Stop   06/24/22 2200  metroNIDAZOLE (FLAGYL) tablet 500 mg  500 mg Oral Every 12 hours 06/24/22 1552     06/21/22 1730  metroNIDAZOLE (FLAGYL) IVPB 500 mg  Status:  Discontinued        500 mg 100 mL/hr over 60 Minutes Intravenous Every 12 hours 06/21/22 1644 06/24/22 1552   06/20/22 0200  cefTRIAXone (ROCEPHIN) 1 g in sodium chloride 0.9 % 100 mL IVPB  Status:  Discontinued        1 g 200 mL/hr over 30 Minutes Intravenous Every 24 hours 06/19/22 0239 06/19/22 1225   06/20/22 0200  cefTRIAXone (ROCEPHIN) 1 g in sodium chloride 0.9 % 100 mL IVPB  Status:  Discontinued        1 g 200 mL/hr over 30 Minutes Intravenous Every 24 hours 06/19/22 1225 06/21/22 1644   06/19/22 1000  nirmatrelvir/ritonavir EUA (PAXLOVID) 3 tablet  Status:  Discontinued        3 tablet Oral 2 times daily 06/19/22 0821 06/21/22 1206   06/19/22 0400  azithromycin  (ZITHROMAX) 500 mg in sodium chloride 0.9 % 250 mL IVPB  Status:  Discontinued        500 mg 250 mL/hr over 60 Minutes Intravenous Every 24 hours 06/19/22 0308 06/19/22 1225   06/19/22 0230  azithromycin (ZITHROMAX) tablet 500 mg  Status:  Discontinued        500 mg Oral  Once 06/19/22 0216 06/19/22 0235   06/19/22 0130  cefTRIAXone (ROCEPHIN) 1 g in sodium chloride 0.9 % 100 mL IVPB        1 g 200 mL/hr over 30 Minutes Intravenous  Once 06/19/22 0122 06/19/22 0230        Medications  Scheduled Meds:  ARIPiprazole  5 mg Oral Daily   brimonidine  1 drop Both Eyes TID   vitamin B-12  1,000 mcg Oral Daily   dorzolamide-timolol  1 drop Both Eyes BID   enoxaparin (LOVENOX) injection  40 mg Subcutaneous Q24H   folic acid  1 mg Oral Daily   insulin aspart  0-15 Units Subcutaneous TID WC   insulin aspart  0-5 Units Subcutaneous QHS   lamoTRIgine  250 mg Oral Daily   latanoprost  1 drop Both Eyes QHS   lisinopril  40 mg Oral Daily   metroNIDAZOLE  500 mg Oral Q12H   mometasone-formoterol  2 puff Inhalation BID   multivitamin with minerals  1 tablet Oral Daily   thiamine  100 mg Oral Daily   Or   thiamine  100 mg Intravenous Daily   umeclidinium bromide  1 puff Inhalation Daily   venlafaxine XR  150 mg Oral Q breakfast   Continuous Infusions:   PRN Meds:.acetaminophen **OR** acetaminophen, haloperidol lactate, oxyCODONE    Subjective:   Lindsey Melton was seen and examined today.  Wants to go home. No new complaints.  Objective:   Vitals:   06/24/22 1953 06/25/22 0531 06/25/22 0805 06/25/22 1345  BP: (!) 148/67 (!) 163/87  (!) 148/91  Pulse: 71 (!) 57  73  Resp: 17 18  18   Temp: 98.3 F (36.8 C) 99.1 F (37.3 C)  98.1 F (36.7 C)  TempSrc: Oral Oral    SpO2: 96% 97% 96% 96%  Weight:      Height:        Intake/Output Summary (Last 24 hours) at 06/25/2022 1500 Last data filed at 06/25/2022 0858 Gross per 24 hour  Intake 360 ml  Output --  Net 360 ml    Filed  Weights   06/18/22 2125 06/19/22  1419  Weight: 72.6 kg 73.6 kg     Exam  General exam: Appears calm and comfortable  Respiratory system: Clear to auscultation. Respiratory effort normal. Cardiovascular system: S1 & S2 heard, RRR. No JVD,  Gastrointestinal system: Abdomen is nondistended, soft and nontender.  Central nervous system: Alert and oriented. Grossly non focal.  Extremities: Symmetric 5 x 5 power. Skin: No rashes, Psychiatry: Mood & affect appropriate.       Data Reviewed:  I have personally reviewed following labs and imaging studies   CBC Lab Results  Component Value Date   WBC 6.5 06/24/2022   RBC 3.87 06/24/2022   HGB 11.4 (L) 06/24/2022   HCT 35.8 (L) 06/24/2022   MCV 92.5 06/24/2022   MCH 29.5 06/24/2022   PLT 390 06/24/2022   MCHC 31.8 06/24/2022   RDW 11.9 06/24/2022   LYMPHSABS 1.5 06/24/2022   MONOABS 0.6 06/24/2022   EOSABS 0.2 06/24/2022   BASOSABS 0.1 06/24/2022     Last metabolic panel Lab Results  Component Value Date   NA 142 06/25/2022   K 4.3 06/25/2022   CL 107 06/25/2022   CO2 25 06/25/2022   BUN 13 06/25/2022   CREATININE 0.76 06/25/2022   GLUCOSE 197 (H) 06/25/2022   GFRNONAA >60 06/25/2022   GFRAA >60 10/14/2019   CALCIUM 9.2 06/25/2022   PHOS 4.5 09/11/2015   PROT 6.3 (L) 06/21/2022   ALBUMIN 3.2 (L) 06/21/2022   LABGLOB 2.4 12/26/2015   AGRATIO 1.8 12/26/2015   BILITOT 0.3 06/21/2022   ALKPHOS 120 06/21/2022   AST 25 06/21/2022   ALT 20 06/21/2022   ANIONGAP 10 06/25/2022    CBG (last 3)  Recent Labs    06/24/22 2158 06/25/22 0752 06/25/22 1207  GLUCAP 226* 167* 159*       Coagulation Profile: No results for input(s): "INR", "PROTIME" in the last 168 hours.   Radiology Studies: No results found.     Kathlen Mody M.D. [facility] 06/25/2022, 3:00 PM  Available via Epic secure chat 7am-7pm After 7 pm, please refer to night coverage provider listed on amion.

## 2022-06-26 DIAGNOSIS — J189 Pneumonia, unspecified organism: Secondary | ICD-10-CM | POA: Diagnosis not present

## 2022-06-26 DIAGNOSIS — F1010 Alcohol abuse, uncomplicated: Secondary | ICD-10-CM | POA: Diagnosis not present

## 2022-06-26 DIAGNOSIS — G9341 Metabolic encephalopathy: Secondary | ICD-10-CM | POA: Diagnosis not present

## 2022-06-26 DIAGNOSIS — E1159 Type 2 diabetes mellitus with other circulatory complications: Secondary | ICD-10-CM | POA: Diagnosis not present

## 2022-06-26 LAB — GLUCOSE, CAPILLARY
Glucose-Capillary: 174 mg/dL — ABNORMAL HIGH (ref 70–99)
Glucose-Capillary: 194 mg/dL — ABNORMAL HIGH (ref 70–99)
Glucose-Capillary: 141 mg/dL — ABNORMAL HIGH (ref 70–99)
Glucose-Capillary: 240 mg/dL — ABNORMAL HIGH (ref 70–99)

## 2022-06-26 NOTE — Progress Notes (Signed)
Mobility Specialist - Progress Note   06/26/22 1114  Mobility  Activity Ambulated with assistance in hallway  Level of Assistance Modified independent, requires aide device or extra time  Assistive Device Front wheel walker  Distance Ambulated (ft) 260 ft  Activity Response Tolerated well  Mobility Referral Yes  $Mobility charge 1 Mobility   Pt received in bed and agreeable to mobility. No complaints during mobility session. Pt to EOB after session with all needs met.     [facility]

## 2022-06-26 NOTE — Progress Notes (Signed)
Mobility Specialist - Progress Note   06/26/22 1458  Mobility  Activity Ambulated with assistance in hallway  Level of Assistance Modified independent, requires aide device or extra time  Assistive Device Front wheel walker  Distance Ambulated (ft) 260 ft  Activity Response Tolerated well  Mobility Referral Yes  $Mobility charge 1 Mobility   Pt received in recliner and agreeable to mobility. C/o SOB upon returning to the room. Pt to recliner after session with all needs met.    [facility]

## 2022-06-26 NOTE — Progress Notes (Signed)
[facility]                                                                               Bradi Arbuthnot, is a 71 y.o. female, DOB - 1950-11-24, FIE:[id number] Admit date - 06/18/2022    Outpatient Primary MD for the patient is Hemberg, Ruby Cola, NP  LOS - 7  days    Brief summary   [name] is a 71 yo female with PMH depression/anxiety, arthritis, DM II, COPD, HLD, HTN, substance use (alcohol, crack cocaine), bipolar.  She presented with altered mentation. She underwent workup on admission. CT head notable for diffuse atrophy otherwise no acute abnormalities.  CXR showed mild patchy airspace disease notably at the right lung base. Lab work relatively unremarkable. UA was noted with large LE, negative nitrite, greater than 50 WBC, rare bacteria. Respiratory swabs were positive for COVID and parainfluenza.  She was started on Paxlovid and admitted for further monitoring. She is stable for discharge. No new complaints overnight.   Assessment & Plan    Assessment and Plan: * Acute metabolic encephalopathy Possibly from UTI and pneumonia.  Appears to have resolved. She is alert and answering questions appropriately.  She is up and walking with mobility specialist.     Pneumonia due to Covid and Parainfluenza infections - CXR noted with patchy interstitial opacities, more prominent in the right lung base - Etiology likely viral given COVID-positive and parainfluenza positive -Unfortunately, patient has refused most of her Paxlovid doses; clinically she is stable; hold off on transitioning to remdesivir and will monitor clinically for now off of treatment - PCT negative,  -Currently on room air, denies any sob or cough.  - no new complaints.   Physical deconditioning - PT/OT eval recommending SNF, currently waiting for a bed at SNF.   UTI (urinary tract infection) - Given gross confusion, treating for presumed UTI as well - UA noted with large LE, negative  nitrite, greater than 50 WBC, rare bacteria -Urine culture growing Gardnerella - complete the course of flagyl.   Alcohol abuse - No withdrawal signs.   Bipolar 1 disorder, depressed, moderate (HCC) - Continue Abilify, Lamictal, Effexor  Hypertension associated with diabetes (HCC) Bp parameters are well controlled.    HLD (hyperlipidemia) - Hold statin for now  Type 2 diabetes mellitus (HCC) CBG (last 3)  Recent Labs    06/25/22 1207 06/25/22 2123 06/26/22 0745  GLUCAP 159* 228* 174*    Resume SSI. No changes in meds.           Estimated body mass index is 30.66 kg/m as calculated from the following:   Height as of this encounter: 5\' 1"  (1.549 m).   Weight as of this encounter: 73.6 kg.  Code Status: full code.  DVT Prophylaxis:  enoxaparin (LOVENOX) injection 40 mg Start: 06/19/22 1000   Level of Care: Level of care: Med-Surg Family Communication: none at bedside.   Disposition Plan:     Remains inpatient appropriate:  waiting for SNF   Procedures:  None.   Consultants:   None.   Antimicrobials:   Anti-infectives (From admission, onward)    Start     Dose/Rate Route  Frequency Ordered Stop   06/24/22 2200  metroNIDAZOLE (FLAGYL) tablet 500 mg        500 mg Oral Every 12 hours 06/24/22 1552     06/21/22 1730  metroNIDAZOLE (FLAGYL) IVPB 500 mg  Status:  Discontinued        500 mg 100 mL/hr over 60 Minutes Intravenous Every 12 hours 06/21/22 1644 06/24/22 1552   06/20/22 0200  cefTRIAXone (ROCEPHIN) 1 g in sodium chloride 0.9 % 100 mL IVPB  Status:  Discontinued        1 g 200 mL/hr over 30 Minutes Intravenous Every 24 hours 06/19/22 0239 06/19/22 1225   06/20/22 0200  cefTRIAXone (ROCEPHIN) 1 g in sodium chloride 0.9 % 100 mL IVPB  Status:  Discontinued        1 g 200 mL/hr over 30 Minutes Intravenous Every 24 hours 06/19/22 1225 06/21/22 1644   06/19/22 1000  nirmatrelvir/ritonavir EUA (PAXLOVID) 3 tablet  Status:  Discontinued        3 tablet  Oral 2 times daily 06/19/22 0821 06/21/22 1206   06/19/22 0400  azithromycin (ZITHROMAX) 500 mg in sodium chloride 0.9 % 250 mL IVPB  Status:  Discontinued        500 mg 250 mL/hr over 60 Minutes Intravenous Every 24 hours 06/19/22 0308 06/19/22 1225   06/19/22 0230  azithromycin (ZITHROMAX) tablet 500 mg  Status:  Discontinued        500 mg Oral  Once 06/19/22 0216 06/19/22 0235   06/19/22 0130  cefTRIAXone (ROCEPHIN) 1 g in sodium chloride 0.9 % 100 mL IVPB        1 g 200 mL/hr over 30 Minutes Intravenous  Once 06/19/22 0122 06/19/22 0230        Medications  Scheduled Meds:  ARIPiprazole  5 mg Oral Daily   brimonidine  1 drop Both Eyes TID   vitamin B-12  1,000 mcg Oral Daily   dorzolamide-timolol  1 drop Both Eyes BID   enoxaparin (LOVENOX) injection  40 mg Subcutaneous [id number]   folic acid  1 mg Oral Daily   insulin aspart  0-15 Units Subcutaneous TID WC   insulin aspart  0-5 Units Subcutaneous QHS   lamoTRIgine  250 mg Oral Daily   latanoprost  1 drop Both Eyes QHS   lisinopril  40 mg Oral Daily   metroNIDAZOLE  500 mg Oral Q12H   mometasone-formoterol  2 puff Inhalation BID   multivitamin with minerals  1 tablet Oral Daily   thiamine  100 mg Oral Daily   Or   thiamine  100 mg Intravenous Daily   umeclidinium bromide  1 puff Inhalation Daily   venlafaxine XR  150 mg Oral Q breakfast   Continuous Infusions:   PRN Meds:.acetaminophen **OR** acetaminophen, haloperidol lactate, oxyCODONE    Subjective:   Lindsey Melton was seen and examined today.  No new complaints.   Objective:   Vitals:   06/25/22 0805 06/25/22 1345 06/25/22 1842 06/25/22 2057  BP:  (!) 148/91  (!) 140/96  Pulse:  73  66  Resp:  18  16  Temp:  98.1 F (36.7 C)  98.9 F (37.2 C)  TempSrc:    Oral  SpO2: 96% 96% 96% 95%  Weight:      Height:        Intake/Output Summary (Last 24 hours) at 06/26/2022 [id number] Last data filed at 06/25/2022 1820 Gross per 24 hour  Intake 120 ml  Output --   Net  120 ml    Filed Weights   06/18/22 2125 06/19/22 1419  Weight: 72.6 kg 73.6 kg     Exam General exam: Appears calm and comfortable  Respiratory system: Clear to auscultation. Respiratory effort normal. Cardiovascular system: S1 & S2 heard, RRR. No JVD Gastrointestinal system: Abdomen is nondistended, soft and nontender.  Central nervous system: Alert and oriented. No focal neurological deficits. Extremities: Symmetric 5 x 5 power. Skin: No rashes, lesions or ulcers Psychiatry:  Mood & affect appropriate.       Data Reviewed:  I have personally reviewed following labs and imaging studies   CBC Lab Results  Component Value Date   WBC 6.5 06/24/2022   RBC 3.87 06/24/2022   HGB 11.4 (L) 06/24/2022   HCT 35.8 (L) 06/24/2022   MCV 92.5 06/24/2022   MCH 29.5 06/24/2022   PLT 390 06/24/2022   MCHC 31.8 06/24/2022   RDW 11.9 06/24/2022   LYMPHSABS 1.5 06/24/2022   MONOABS 0.6 06/24/2022   EOSABS 0.2 06/24/2022   BASOSABS 0.1 [id number]     Last metabolic panel Lab Results  Component Value Date   NA 142 06/25/2022   K 4.3 06/25/2022   CL 107 06/25/2022   CO2 25 06/25/2022   BUN 13 06/25/2022   CREATININE 0.76 06/25/2022   GLUCOSE 197 (H) 06/25/2022   GFRNONAA >60 06/25/2022   GFRAA >60 10/14/2019   CALCIUM 9.2 06/25/2022   PHOS 4.5 09/11/2015   PROT 6.3 (L) 06/21/2022   ALBUMIN 3.2 (L) 06/21/2022   LABGLOB 2.4 12/26/2015   AGRATIO 1.8 12/26/2015   BILITOT 0.3 06/21/2022   ALKPHOS 120 06/21/2022   AST 25 06/21/2022   ALT 20 06/21/2022   ANIONGAP 10 06/25/2022    CBG (last 3)  Recent Labs    06/25/22 1207 06/25/22 2123 06/26/22 0745  GLUCAP 159* 228* 174*       Coagulation Profile: No results for input(s): "INR", "PROTIME" in the last 168 hours.   Radiology Studies: No results found.     Hosie Poisson M.D. [facility] 06/26/2022, 9:07 AM  Available via Epic secure chat 7am-7pm After 7 pm, please refer to night coverage  provider listed on amion.

## 2022-06-27 DIAGNOSIS — J189 Pneumonia, unspecified organism: Secondary | ICD-10-CM | POA: Diagnosis not present

## 2022-06-27 DIAGNOSIS — E1159 Type 2 diabetes mellitus with other circulatory complications: Secondary | ICD-10-CM | POA: Diagnosis not present

## 2022-06-27 DIAGNOSIS — G9341 Metabolic encephalopathy: Secondary | ICD-10-CM | POA: Diagnosis not present

## 2022-06-27 DIAGNOSIS — F1010 Alcohol abuse, uncomplicated: Secondary | ICD-10-CM | POA: Diagnosis not present

## 2022-06-27 LAB — GLUCOSE, CAPILLARY
Glucose-Capillary: 170 mg/dL — ABNORMAL HIGH (ref 70–99)
Glucose-Capillary: 183 mg/dL — ABNORMAL HIGH (ref 70–99)
Glucose-Capillary: 135 mg/dL — ABNORMAL HIGH (ref 70–99)
Glucose-Capillary: 271 mg/dL — ABNORMAL HIGH (ref 70–99)

## 2022-06-27 NOTE — Progress Notes (Signed)
Mobility Specialist - Progress Note   06/27/22 1117  Mobility  Activity Ambulated with assistance in hallway  Level of Assistance Standby assist, set-up cues, supervision of patient - no hands on  Assistive Device Front wheel walker  Distance Ambulated (ft) 275 ft  Activity Response Tolerated well  Mobility Referral Yes  $Mobility charge 1 Mobility   Pt received in recliner and agreeable to mobility. No complaints during mobility. Pt to recliner after session with all needs met.    [facility]

## 2022-06-27 NOTE — Progress Notes (Signed)
Mobility Specialist - Progress Note   06/27/22 1521  Mobility  Activity Ambulated with assistance in hallway  Level of Assistance Standby assist, set-up cues, supervision of patient - no hands on  Assistive Device Front wheel walker  Distance Ambulated (ft) 275 ft  Activity Response Tolerated well  Mobility Referral Yes  $Mobility charge 1 Mobility   Pt received in bed and agreeable to mobility. No complaints during mobility. Pt to recliner after session with all needs met.    [facility]

## 2022-06-27 NOTE — Progress Notes (Signed)
[facility]                                                                               Lindsey Melton, is a 71 y.o. female, DOB - 1951-02-27, YSA:[id number] Admit date - 06/18/2022    Outpatient Primary MD for the patient is Hemberg, Ruby Cola, NP  LOS - 8  days    Brief summary   Lindsey Melton is a 71 yo female with PMH depression/anxiety, arthritis, DM II, COPD, HLD, HTN, substance use (alcohol, crack cocaine), bipolar.  She presented with altered mentation. She underwent workup on admission. CT head notable for diffuse atrophy otherwise no acute abnormalities.  CXR showed mild patchy airspace disease notably at the right lung base. Lab work relatively unremarkable. UA was noted with large LE, negative nitrite, greater than 50 WBC, rare bacteria. Respiratory swabs were positive for COVID and parainfluenza.  She was started on Paxlovid and admitted for further monitoring. She is stable for discharge. No new complaints overnight.   Assessment & Plan    Assessment and Plan: * Acute metabolic encephalopathy Possibly from UTI and pneumonia.  Appears to have resolved. She is alert and answering questions appropriately.  She is up and walking with mobility specialist.     Pneumonia due to Covid and Parainfluenza infections - CXR noted with patchy interstitial opacities, more prominent in the right lung base - Etiology likely viral given COVID-positive and parainfluenza positive -Unfortunately, patient has refused most of her Paxlovid doses; clinically she is stable; hold off on transitioning to remdesivir and will monitor clinically for now off of treatment - PCT negative,  -Currently on room air, denies any sob or cough.  - no new complaints.   Physical deconditioning - PT/OT eval recommending SNF, currently waiting for a bed at SNF.   UTI (urinary tract infection) - Given gross confusion, treating for presumed UTI as well - UA noted with large LE, negative  nitrite, greater than 50 WBC, rare bacteria -Urine culture growing Gardnerella - complete the course of flagyl.   Alcohol abuse - No withdrawal signs.   Bipolar 1 disorder, depressed, moderate (HCC) - Continue Abilify, Lamictal, Effexor  Hypertension associated with diabetes (HCC) Bp parameters are well controlled.    HLD (hyperlipidemia) - Hold statin for now  Type 2 diabetes mellitus (HCC) CBG (last 3)  Recent Labs    06/27/22 0805 06/27/22 1155 06/27/22 1638  GLUCAP 170* 183* 135*    Resume SSI. No changes in meds.           Estimated body mass index is 30.66 kg/m as calculated from the following:   Height as of this encounter: 5\' 1"  (1.549 m).   Weight as of this encounter: 73.6 kg.  Code Status: full code.  DVT Prophylaxis:  enoxaparin (LOVENOX) injection 40 mg Start: 06/19/22 1000   Level of Care: Level of care: Med-Surg Family Communication: none at bedside.   Disposition Plan:     Remains inpatient appropriate:  waiting for SNF   Procedures:  None.   Consultants:   None.   Antimicrobials:   Anti-infectives (From admission, onward)    Start     Dose/Rate Route  Frequency Ordered Stop   06/24/22 2200  metroNIDAZOLE (FLAGYL) tablet 500 mg        500 mg Oral Every 12 hours 06/24/22 1552     06/21/22 1730  metroNIDAZOLE (FLAGYL) IVPB 500 mg  Status:  Discontinued        500 mg 100 mL/hr over 60 Minutes Intravenous Every 12 hours 06/21/22 1644 06/24/22 1552   06/20/22 0200  cefTRIAXone (ROCEPHIN) 1 g in sodium chloride 0.9 % 100 mL IVPB  Status:  Discontinued        1 g 200 mL/hr over 30 Minutes Intravenous Every 24 hours 06/19/22 0239 06/19/22 1225   06/20/22 0200  cefTRIAXone (ROCEPHIN) 1 g in sodium chloride 0.9 % 100 mL IVPB  Status:  Discontinued        1 g 200 mL/hr over 30 Minutes Intravenous Every 24 hours 06/19/22 1225 06/21/22 1644   06/19/22 1000  nirmatrelvir/ritonavir EUA (PAXLOVID) 3 tablet  Status:  Discontinued        3 tablet  Oral 2 times daily 06/19/22 0821 06/21/22 1206   06/19/22 0400  azithromycin (ZITHROMAX) 500 mg in sodium chloride 0.9 % 250 mL IVPB  Status:  Discontinued        500 mg 250 mL/hr over 60 Minutes Intravenous Every 24 hours 06/19/22 0308 06/19/22 1225   06/19/22 0230  azithromycin (ZITHROMAX) tablet 500 mg  Status:  Discontinued        500 mg Oral  Once 06/19/22 0216 06/19/22 0235   06/19/22 0130  cefTRIAXone (ROCEPHIN) 1 g in sodium chloride 0.9 % 100 mL IVPB        1 g 200 mL/hr over 30 Minutes Intravenous  Once 06/19/22 0122 06/19/22 0230        Medications  Scheduled Meds:  ARIPiprazole  5 mg Oral Daily   brimonidine  1 drop Both Eyes TID   vitamin B-12  1,000 mcg Oral Daily   dorzolamide-timolol  1 drop Both Eyes BID   enoxaparin (LOVENOX) injection  40 mg Subcutaneous Q24H   folic acid  1 mg Oral Daily   insulin aspart  0-15 Units Subcutaneous TID WC   insulin aspart  0-5 Units Subcutaneous QHS   lamoTRIgine  250 mg Oral Daily   latanoprost  1 drop Both Eyes QHS   lisinopril  40 mg Oral Daily   metroNIDAZOLE  500 mg Oral Q12H   mometasone-formoterol  2 puff Inhalation BID   multivitamin with minerals  1 tablet Oral Daily   thiamine  100 mg Oral Daily   Or   thiamine  100 mg Intravenous Daily   umeclidinium bromide  1 puff Inhalation Daily   venlafaxine XR  150 mg Oral Q breakfast   Continuous Infusions:   PRN Meds:.acetaminophen **OR** acetaminophen, haloperidol lactate, oxyCODONE    Subjective:   Lindsey Melton was seen and examined today.  No new complaints.   Objective:   Vitals:   06/26/22 2042 06/27/22 0620 06/27/22 0621 06/27/22 1401  BP: (!) 174/89 (!) 143/108 133/85 133/79  Pulse: 70 77 76 62  Resp: 18 18  16   Temp: 98.6 F (37 C) 99.4 F (37.4 C)  98.8 F (37.1 C)  TempSrc: Oral Oral  Oral  SpO2: 98% 99%  96%  Weight:      Height:        Intake/Output Summary (Last 24 hours) at 06/27/2022 1817 Last data filed at 06/27/2022 0955 Gross per  24 hour  Intake 240 ml  Output --  Net 240 ml    Filed Weights   06/18/22 2125 06/19/22 1419  Weight: 72.6 kg 73.6 kg     Exam General exam: Appears calm and comfortable  Respiratory system: Clear to auscultation. Respiratory effort normal. Cardiovascular system: S1 & S2 heard, RRR. No JVD,  No pedal edema. Gastrointestinal system: Abdomen is nondistended, soft and nontender.  Central nervous system: Alert and oriented. No focal neurological deficits. Extremities: Symmetric 5 x 5 power. Skin: No rashes, Psychiatry: Mood & affect appropriate.        Data Reviewed:  I have personally reviewed following labs and imaging studies   CBC Lab Results  Component Value Date   WBC 6.5 06/24/2022   RBC 3.87 06/24/2022   HGB 11.4 (L) 06/24/2022   HCT 35.8 (L) 06/24/2022   MCV 92.5 06/24/2022   MCH 29.5 06/24/2022   PLT 390 06/24/2022   MCHC 31.8 06/24/2022   RDW 11.9 06/24/2022   LYMPHSABS 1.5 06/24/2022   MONOABS 0.6 06/24/2022   EOSABS 0.2 06/24/2022   BASOSABS 0.1 06/24/2022     Last metabolic panel Lab Results  Component Value Date   NA 142 06/25/2022   K 4.3 06/25/2022   CL 107 06/25/2022   CO2 25 06/25/2022   BUN 13 06/25/2022   CREATININE 0.76 06/25/2022   GLUCOSE 197 (H) 06/25/2022   GFRNONAA >60 06/25/2022   GFRAA >60 10/14/2019   CALCIUM 9.2 06/25/2022   PHOS 4.5 09/11/2015   PROT 6.3 (L) 06/21/2022   ALBUMIN 3.2 (L) 06/21/2022   LABGLOB 2.4 12/26/2015   AGRATIO 1.8 12/26/2015   BILITOT 0.3 06/21/2022   ALKPHOS 120 06/21/2022   AST 25 06/21/2022   ALT 20 06/21/2022   ANIONGAP 10 06/25/2022    CBG (last 3)  Recent Labs    06/27/22 0805 06/27/22 1155 06/27/22 1638  GLUCAP 170* 183* 135*       Coagulation Profile: No results for input(s): "INR", "PROTIME" in the last 168 hours.   Radiology Studies: No results found.     Kathlen Mody M.D. [facility] 06/27/2022, 6:17 PM  Available via Epic secure chat 7am-7pm After 7  pm, please refer to night coverage provider listed on amion.

## 2022-06-28 DIAGNOSIS — J189 Pneumonia, unspecified organism: Secondary | ICD-10-CM | POA: Diagnosis not present

## 2022-06-28 DIAGNOSIS — F1010 Alcohol abuse, uncomplicated: Secondary | ICD-10-CM | POA: Diagnosis not present

## 2022-06-28 DIAGNOSIS — E1159 Type 2 diabetes mellitus with other circulatory complications: Secondary | ICD-10-CM | POA: Diagnosis not present

## 2022-06-28 DIAGNOSIS — G9341 Metabolic encephalopathy: Secondary | ICD-10-CM | POA: Diagnosis not present

## 2022-06-28 LAB — GLUCOSE, CAPILLARY
Glucose-Capillary: 159 mg/dL — ABNORMAL HIGH (ref 70–99)
Glucose-Capillary: 155 mg/dL — ABNORMAL HIGH (ref 70–99)
Glucose-Capillary: 353 mg/dL — ABNORMAL HIGH (ref 70–99)
Glucose-Capillary: 109 mg/dL — ABNORMAL HIGH (ref 70–99)
Glucose-Capillary: 220 mg/dL — ABNORMAL HIGH (ref 70–99)

## 2022-06-28 NOTE — TOC Progression Note (Signed)
Transition of Care [facility]) - Progression Note    Patient Details  Name: Lindsey Melton MRN: [id number] Date of Birth: June 12, 1951  [facility]) CM/SW Contact  Otelia Santee, LCSW Phone Number: 06/28/2022, 12:24 PM  Clinical Narrative:    CSW spoke with pt's daughter and reviewed bed offer. Pt's daughter has accepted SNF placement at [facility]. Pt to complete COVID isolation 06/29/22.  Pt will be able to transfer to their facility on 06/30/22 pending bed availability and insurance authorization. Insurance [employer] has been requested and currently awaiting approval.    Expected Discharge Plan: Skilled Nursing Facility Barriers to Discharge: SNF Covid  Expected Discharge Plan and Services Expected Discharge Plan: Skilled Nursing Facility In-house Referral: NA Discharge Planning Services: CM Consult Post Acute Care Choice: Skilled Nursing Facility Living arrangements for the past 2 months: Apartment                 DME Arranged: N/A DME Agency: NA                   Social Determinants of Health (SDOH) Interventions Food Insecurity Interventions: Inpatient TOC Housing Interventions: Intervention Not Indicated  Readmission Risk Interventions    06/25/2022    9:44 AM 06/23/2022    8:52 AM 06/21/2022    2:24 PM  Readmission Risk Prevention Plan  Transportation Screening Complete Complete Complete  PCP or Specialist Appt within 5-7 Days Complete Complete Complete  Home Care Screening Complete Complete Complete  Medication Review (RN CM) Complete Complete Complete

## 2022-06-28 NOTE — Progress Notes (Signed)
Mobility Specialist - Progress Note   06/28/22 1620  Mobility  Activity Ambulated with assistance in hallway  Level of Assistance Standby assist, set-up cues, supervision of patient - no hands on  Assistive Device Front wheel walker  Distance Ambulated (ft) 260 ft  Activity Response Tolerated well  Mobility Referral Yes  $Mobility charge 1 Mobility   Pt received in bed and agreeable to mobility. No complaints during mobility. Pt to EOB after session with all needs met.    [facility]  [facility]

## 2022-06-28 NOTE — Progress Notes (Signed)
Physical Therapy Treatment Patient Details Name: Lindsey Melton MRN: [id number] DOB: 1951/02/15 Today's Date: 06/28/2022   History of Present Illness 71 yo female admitted with altered mentation. Pt with acute metabolic encephalopathy, pneumonia due to Covid and parainfluenza infections, UTI. PMH depression/anxiety, arthritis, DM II, COPD, HLD, HTN, substance use (alcohol, crack cocaine), bipolar, dementia (per daughter).    PT Comments    Pt is progressing with mobility. She tolerated distance well. She is upset about still being in the hospital. She still requires cues for safety when mobilizing. Plan is for SNF.    Recommendations for follow up therapy are one component of a multi-disciplinary discharge planning process, led by the attending physician.  Recommendations may be updated based on patient status, additional functional criteria and insurance authorization.  Follow Up Recommendations  Skilled nursing-short term rehab (<3 hours/day) Can patient physically be transported by private vehicle: Yes   Assistance Recommended at Discharge Frequent or constant Supervision/Assistance  Patient can return home with the following A little help with walking and/or transfers;A little help with bathing/dressing/bathroom;Assistance with cooking/housework   Equipment Recommendations  None recommended by PT    Recommendations for Other Services       Precautions / Restrictions Precautions Precautions: Fall Restrictions Weight Bearing Restrictions: No     Mobility  Bed Mobility Overal bed mobility: Modified Independent                  Transfers Overall transfer level: Needs assistance Equipment used: Rolling walker (2 wheels) Transfers: Sit to/from Stand Sit to Stand: Supervision           General transfer comment: Cues for safety.    Ambulation/Gait Ambulation/Gait assistance: Min guard Gait Distance (Feet): 175 Feet Assistive device: Rolling walker (2  wheels) Gait Pattern/deviations: Step-through pattern, Decreased stride length       General Gait Details: Min guard A for mobility with RW. She also walked from bathroom without a device due to pt pushing walker to the side. Without device. she tends to "furniture walk". Cues for safety throughout session.   Stairs             Wheelchair Mobility    Modified Rankin (Stroke Patients Only)       Balance Overall balance assessment: Needs assistance, History of Falls                                          Cognition Arousal/Alertness: Awake/alert Behavior During Therapy: WFL for tasks assessed/performed Overall Cognitive Status: No family/caregiver present to determine baseline cognitive functioning Area of Impairment: Safety/judgement                         Safety/Judgement: Decreased awareness of safety              Exercises      General Comments        Pertinent Vitals/Pain Pain Assessment Pain Assessment: No/denies pain    Home Living                          Prior Function            PT Goals (current goals can now be found in the care plan section) Progress towards PT goals: Progressing toward goals    Frequency    Min 2X/week  PT Plan Current plan remains appropriate    Co-evaluation              AM-PAC PT "6 Clicks" Mobility   Outcome Measure  Help needed turning from your back to your side while in a flat bed without using bedrails?: A Little Help needed moving from lying on your back to sitting on the side of a flat bed without using bedrails?: A Little Help needed moving to and from a bed to a chair (including a wheelchair)?: A Little Help needed standing up from a chair using your arms (e.g., wheelchair or bedside chair)?: A Little Help needed to walk in hospital room?: A Little Help needed climbing 3-5 steps with a railing? : A Lot 6 Click Score: 17    End of Session  Equipment Utilized During Treatment: Gait belt Activity Tolerance: Patient tolerated treatment well Patient left: in bed;with call bell/phone within reach;with bed alarm set   PT Visit Diagnosis: Difficulty in walking, not elsewhere classified (R26.2);Muscle weakness (generalized) (M62.81)     Time: 3664-4034 PT Time Calculation (min) (ACUTE ONLY): 15 min  Charges:  $Gait Training: 8-22 mins                        Faye Ramsay, PT Acute Rehabilitation  Office: [phone]

## 2022-06-28 NOTE — Plan of Care (Signed)

## 2022-06-28 NOTE — Progress Notes (Signed)
[facility]                                                                               Lindsey Melton, is a 71 y.o. female, DOB - 06-May-1951, ZOX:[id number] Admit date - 06/18/2022    Outpatient Primary MD for the patient is Melton, Lindsey Cola, NP  LOS - 9  days    Brief summary   [name] is a 71 yo female with PMH depression/anxiety, arthritis, DM II, COPD, HLD, HTN, substance use (alcohol, crack cocaine), bipolar.  She presented with altered mentation. She underwent workup on admission. CT head notable for diffuse atrophy otherwise no acute abnormalities.  CXR showed mild patchy airspace disease notably at the right lung base. Lab work relatively unremarkable. UA was noted with large LE, negative nitrite, greater than 50 WBC, rare bacteria. Respiratory swabs were positive for COVID and parainfluenza.  She was started on Paxlovid and admitted for further monitoring. She is stable for discharge. No new complaints overnight.   Assessment & Plan    Assessment and Plan: * Acute metabolic encephalopathy Possibly from UTI and pneumonia.  Appears to have resolved.   She is up and walking with mobility specialist.  She is alert and answering questions. Very anxious to go home.    Pneumonia due to Covid and Parainfluenza infections - CXR noted with patchy interstitial opacities, more prominent in the right lung base - Etiology likely viral given COVID-positive and parainfluenza positive -Unfortunately, patient has refused most of her Paxlovid doses; clinically she is stable; hold off on transitioning to remdesivir and will monitor clinically for now off of treatment - PCT negative,  -Currently on room air, denies any sob or cough.  - no new complaints.   Physical deconditioning - PT/OT eval recommending SNF, currently waiting for a bed at SNF.   UTI (urinary tract infection) - Given gross confusion, treating for presumed UTI as well - UA noted with large  LE, negative nitrite, greater than 50 WBC, rare bacteria -Urine culture growing Gardnerella - complete the course of flagyl.   Alcohol abuse - No withdrawal signs.   Bipolar 1 disorder, depressed, moderate (HCC) - Continue Abilify, Lamictal, Effexor  Hypertension associated with diabetes (HCC) Bp parameters are well controlled.    HLD (hyperlipidemia) - Hold statin for now  Type 2 diabetes mellitus (HCC) CBG (last 3)  Recent Labs    06/27/22 2153 06/28/22 0749 06/28/22 1156  GLUCAP 271* 155* 353*    Resume SSI. Elevated cbg's. Add 2 units of novolog TIDAC.           Estimated body mass index is 30.66 kg/m as calculated from the following:   Height as of this encounter: 5\' 1"  (1.549 m).   Weight as of this encounter: 73.6 kg.  Code Status: full code.  DVT Prophylaxis:  enoxaparin (LOVENOX) injection 40 mg Start: 06/19/22 1000   Level of Care: Level of care: Med-Surg Family Communication: none at bedside.   Disposition Plan:     Remains inpatient appropriate:  waiting for SNF   Procedures:  None.   Consultants:   None.   Antimicrobials:   Anti-infectives (From admission, onward)  Start     Dose/Rate Route Frequency Ordered Stop   06/24/22 2200  metroNIDAZOLE (FLAGYL) tablet 500 mg        500 mg Oral Every 12 hours 06/24/22 1552     06/21/22 1730  metroNIDAZOLE (FLAGYL) IVPB 500 mg  Status:  Discontinued        500 mg 100 mL/hr over 60 Minutes Intravenous Every 12 hours 06/21/22 1644 06/24/22 1552   06/20/22 0200  cefTRIAXone (ROCEPHIN) 1 g in sodium chloride 0.9 % 100 mL IVPB  Status:  Discontinued        1 g 200 mL/hr over 30 Minutes Intravenous Every 24 hours 06/19/22 0239 06/19/22 1225   06/20/22 0200  cefTRIAXone (ROCEPHIN) 1 g in sodium chloride 0.9 % 100 mL IVPB  Status:  Discontinued        1 g 200 mL/hr over 30 Minutes Intravenous Every 24 hours 06/19/22 1225 06/21/22 1644   06/19/22 1000  nirmatrelvir/ritonavir EUA (PAXLOVID) 3 tablet   Status:  Discontinued        3 tablet Oral 2 times daily 06/19/22 0821 06/21/22 1206   06/19/22 0400  azithromycin (ZITHROMAX) 500 mg in sodium chloride 0.9 % 250 mL IVPB  Status:  Discontinued        500 mg 250 mL/hr over 60 Minutes Intravenous Every 24 hours 06/19/22 0308 06/19/22 1225   06/19/22 0230  azithromycin (ZITHROMAX) tablet 500 mg  Status:  Discontinued        500 mg Oral  Once 06/19/22 0216 06/19/22 0235   06/19/22 0130  cefTRIAXone (ROCEPHIN) 1 g in sodium chloride 0.9 % 100 mL IVPB        1 g 200 mL/hr over 30 Minutes Intravenous  Once 06/19/22 0122 06/19/22 0230        Medications  Scheduled Meds:  ARIPiprazole  5 mg Oral Daily   brimonidine  1 drop Both Eyes TID   vitamin B-12  1,000 mcg Oral Daily   dorzolamide-timolol  1 drop Both Eyes BID   enoxaparin (LOVENOX) injection  40 mg Subcutaneous Q24H   folic acid  1 mg Oral Daily   insulin aspart  0-15 Units Subcutaneous TID WC   insulin aspart  0-5 Units Subcutaneous QHS   lamoTRIgine  250 mg Oral Daily   latanoprost  1 drop Both Eyes QHS   lisinopril  40 mg Oral Daily   metroNIDAZOLE  500 mg Oral Q12H   mometasone-formoterol  2 puff Inhalation BID   multivitamin with minerals  1 tablet Oral Daily   thiamine  100 mg Oral Daily   Or   thiamine  100 mg Intravenous Daily   umeclidinium bromide  1 puff Inhalation Daily   venlafaxine XR  150 mg Oral Q breakfast   Continuous Infusions:   PRN Meds:.acetaminophen **OR** acetaminophen, haloperidol lactate, oxyCODONE    Subjective:   Jalie Eiland was seen and examined today.  NO new complaints.  Objective:   Vitals:   06/27/22 1939 06/28/22 0602 06/28/22 0758 06/28/22 1408  BP: 134/85 (!) 155/113  134/86  Pulse: 67 68  68  Resp: 17 18  20   Temp: 98.2 F (36.8 C) 98 F (36.7 C)  98.4 F (36.9 C)  TempSrc: Oral Oral  Oral  SpO2: 95% 100% 100% 99%  Weight:      Height:        Intake/Output Summary (Last 24 hours) at 06/28/2022 1636 Last data filed  at 06/28/2022 1451 Gross per 24 hour  Intake 947 ml  Output --  Net 947 ml    Filed Weights   06/18/22 2125 06/19/22 1419  Weight: 72.6 kg 73.6 kg     Exam General exam: Appears calm and comfortable  Respiratory system: Clear to auscultation. Respiratory effort normal. Cardiovascular system: S1 & S2 heard, RRR. No JVD,  Gastrointestinal system: Abdomen is nondistended, soft and nontender.  Central nervous system: Alert and oriented. No focal neurological deficits. Extremities: Symmetric 5 x 5 power. Skin: No rashes, lesions or ulcers Psychiatry:  Mood & affect appropriate.         Data Reviewed:  I have personally reviewed following labs and imaging studies   CBC Lab Results  Component Value Date   WBC 6.5 06/24/2022   RBC 3.87 06/24/2022   HGB 11.4 (L) 06/24/2022   HCT 35.8 (L) 06/24/2022   MCV 92.5 06/24/2022   MCH 29.5 06/24/2022   PLT 390 06/24/2022   MCHC 31.8 06/24/2022   RDW 11.9 06/24/2022   LYMPHSABS 1.5 06/24/2022   MONOABS 0.6 06/24/2022   EOSABS 0.2 06/24/2022   BASOSABS 0.1 06/24/2022     Last metabolic panel Lab Results  Component Value Date   NA 142 06/25/2022   K 4.3 06/25/2022   CL 107 06/25/2022   CO2 25 06/25/2022   BUN 13 06/25/2022   CREATININE 0.76 06/25/2022   GLUCOSE 197 (H) 06/25/2022   GFRNONAA >60 06/25/2022   GFRAA >60 10/14/2019   CALCIUM 9.2 06/25/2022   PHOS 4.5 09/11/2015   PROT 6.3 (L) 06/21/2022   ALBUMIN 3.2 (L) 06/21/2022   LABGLOB 2.4 12/26/2015   AGRATIO 1.8 12/26/2015   BILITOT 0.3 06/21/2022   ALKPHOS 120 06/21/2022   AST 25 06/21/2022   ALT 20 06/21/2022   ANIONGAP 10 06/25/2022    CBG (last 3)  Recent Labs    06/27/22 2153 06/28/22 0749 06/28/22 1156  GLUCAP 271* 155* 353*       Coagulation Profile: No results for input(s): "INR", "PROTIME" in the last 168 hours.   Radiology Studies: No results found.     Kathlen Mody M.D. [facility] 06/28/2022, 4:36 PM  Available via  Epic secure chat 7am-7pm After 7 pm, please refer to night coverage provider listed on amion.

## 2022-06-29 DIAGNOSIS — E1159 Type 2 diabetes mellitus with other circulatory complications: Secondary | ICD-10-CM | POA: Diagnosis not present

## 2022-06-29 DIAGNOSIS — F1010 Alcohol abuse, uncomplicated: Secondary | ICD-10-CM | POA: Diagnosis not present

## 2022-06-29 DIAGNOSIS — J189 Pneumonia, unspecified organism: Secondary | ICD-10-CM | POA: Diagnosis not present

## 2022-06-29 DIAGNOSIS — G9341 Metabolic encephalopathy: Secondary | ICD-10-CM | POA: Diagnosis not present

## 2022-06-29 LAB — GLUCOSE, CAPILLARY
Glucose-Capillary: 180 mg/dL — ABNORMAL HIGH (ref 70–99)
Glucose-Capillary: 239 mg/dL — ABNORMAL HIGH (ref 70–99)
Glucose-Capillary: 164 mg/dL — ABNORMAL HIGH (ref 70–99)
Glucose-Capillary: 245 mg/dL — ABNORMAL HIGH (ref 70–99)

## 2022-06-29 MED ORDER — HYDROXYZINE HCL 25 MG PO TABS
25.0000 mg | ORAL_TABLET | Freq: Three times a day (TID) | ORAL | Status: DC | PRN
  Administered 2022-06-29: 25 mg via ORAL
  Filled 2022-06-29: qty 1

## 2022-06-29 MED ORDER — INSULIN GLARGINE-YFGN 100 UNIT/ML ~~LOC~~ SOLN
15.0000 [IU] | Freq: Every day | SUBCUTANEOUS | Status: DC
  Administered 2022-06-29 – 2022-06-30 (×2): 15 [IU] via SUBCUTANEOUS
  Filled 2022-06-29 (×2): qty 0.15

## 2022-06-29 MED ORDER — ALUM & MAG HYDROXIDE-SIMETH 200-200-20 MG/5ML PO SUSP
15.0000 mL | Freq: Four times a day (QID) | ORAL | Status: DC | PRN
  Administered 2022-06-29: 15 mL via ORAL
  Filled 2022-06-29: qty 30

## 2022-06-29 NOTE — Progress Notes (Signed)
Mobility Specialist - Progress Note   06/29/22 1042  Mobility  Activity Ambulated with assistance in hallway  Level of Assistance Standby assist, set-up cues, supervision of patient - no hands on  Assistive Device Front wheel walker  Distance Ambulated (ft) 260 ft  Activity Response Tolerated well  Mobility Referral Yes  $Mobility charge 1 Mobility   Pt received in bed and agreeable to mobility. C/o chest pain when breathing in. Pt to recliner after session with all needs met.    [facility]

## 2022-06-29 NOTE — Plan of Care (Signed)

## 2022-06-29 NOTE — Progress Notes (Signed)
[facility]                                                                               Lindsey Melton, is a 71 y.o. female, DOB - 09-29-1950, PYP:[id number] Admit date - 06/18/2022    Outpatient Primary MD for the patient is Hemberg, Lindsey Cola, NP  LOS - 10  days    Brief summary   Lindsey Melton is a 71 yo female with PMH depression/anxiety, arthritis, DM II, COPD, HLD, HTN, substance use (alcohol, crack cocaine), bipolar.  She presented with altered mentation. She underwent workup on admission. CT head notable for diffuse atrophy otherwise no acute abnormalities.  CXR showed mild patchy airspace disease notably at the right lung base. Lab work relatively unremarkable. UA was noted with large LE, negative nitrite, greater than 50 WBC, rare bacteria. Respiratory swabs were positive for COVID and parainfluenza.  She was started on Paxlovid and admitted for further monitoring. She is stable for discharge. No new complaints overnight.   Assessment & Plan    Assessment and Plan: * Acute metabolic encephalopathy Possibly from UTI and pneumonia.  Appears to have resolved.   She is up and walking with mobility specialist.  She is alert and answering questions. Very anxious to go home.    Pneumonia due to Covid and Parainfluenza infections - CXR noted with patchy interstitial opacities, more prominent in the right lung base - Etiology likely viral given COVID-positive and parainfluenza positive -Unfortunately, patient has refused most of her Paxlovid doses; clinically she is stable;  - PCT negative,  -Currently on room air, denies any sob or cough.  - no new complaints. She will finish the isolation by tomorrow.   Physical deconditioning - PT/OT eval recommending SNF, currently waiting for a bed at SNF.   UTI (urinary tract infection) - Given gross confusion, treating for presumed UTI as well - UA noted with large LE, negative nitrite, greater than 50 WBC, rare  bacteria -Urine culture growing Gardnerella - completed the course of flagyl.   Alcohol abuse - No withdrawal signs.   Bipolar 1 disorder, depressed, moderate (HCC) - Continue Abilify, Lamictal, Effexor  Hypertension associated with diabetes (HCC) Bp parameters are optimal.    HLD (hyperlipidemia) Restart statin on discharge.   Type 2 diabetes mellitus (HCC), uncontrolled with hyperglycemia.  CBG (last 3)  Recent Labs    06/28/22 2151 06/29/22 0714 06/29/22 1134  GLUCAP 220* 180* 239*   Last A1c is 10.8, started her on semglee 15 units daily.  Resume SSI.           Estimated body mass index is 30.66 kg/m as calculated from the following:   Height as of this encounter: 5\' 1"  (1.549 m).   Weight as of this encounter: 73.6 kg.  Code Status: full code.  DVT Prophylaxis:  enoxaparin (LOVENOX) injection 40 mg Start: 06/19/22 1000   Level of Care: Level of care: Med-Surg Family Communication: none at bedside.   Disposition Plan:     Remains inpatient appropriate:  waiting for SNF   Procedures:  None.   Consultants:   None.   Antimicrobials:   Anti-infectives (From admission, onward)  Start     Dose/Rate Route Frequency Ordered Stop   06/24/22 2200  metroNIDAZOLE (FLAGYL) tablet 500 mg        500 mg Oral Every 12 hours 06/24/22 1552     06/21/22 1730  metroNIDAZOLE (FLAGYL) IVPB 500 mg  Status:  Discontinued        500 mg 100 mL/hr over 60 Minutes Intravenous Every 12 hours 06/21/22 1644 06/24/22 1552   06/20/22 0200  cefTRIAXone (ROCEPHIN) 1 g in sodium chloride 0.9 % 100 mL IVPB  Status:  Discontinued        1 g 200 mL/hr over 30 Minutes Intravenous Every 24 hours 06/19/22 0239 06/19/22 1225   06/20/22 0200  cefTRIAXone (ROCEPHIN) 1 g in sodium chloride 0.9 % 100 mL IVPB  Status:  Discontinued        1 g 200 mL/hr over 30 Minutes Intravenous Every 24 hours 06/19/22 1225 06/21/22 1644   06/19/22 1000  nirmatrelvir/ritonavir EUA (PAXLOVID) 3 tablet   Status:  Discontinued        3 tablet Oral 2 times daily 06/19/22 0821 06/21/22 1206   06/19/22 0400  azithromycin (ZITHROMAX) 500 mg in sodium chloride 0.9 % 250 mL IVPB  Status:  Discontinued        500 mg 250 mL/hr over 60 Minutes Intravenous Every 24 hours 06/19/22 0308 06/19/22 1225   06/19/22 0230  azithromycin (ZITHROMAX) tablet 500 mg  Status:  Discontinued        500 mg Oral  Once 06/19/22 0216 06/19/22 0235   06/19/22 0130  cefTRIAXone (ROCEPHIN) 1 g in sodium chloride 0.9 % 100 mL IVPB        1 g 200 mL/hr over 30 Minutes Intravenous  Once 06/19/22 0122 06/19/22 0230        Medications  Scheduled Meds:  ARIPiprazole  5 mg Oral Daily   brimonidine  1 drop Both Eyes TID   vitamin B-12  1,000 mcg Oral Daily   dorzolamide-timolol  1 drop Both Eyes BID   enoxaparin (LOVENOX) injection  40 mg Subcutaneous Q24H   folic acid  1 mg Oral Daily   insulin aspart  0-15 Units Subcutaneous TID WC   insulin aspart  0-5 Units Subcutaneous QHS   insulin glargine-yfgn  15 Units Subcutaneous Daily   lamoTRIgine  250 mg Oral Daily   latanoprost  1 drop Both Eyes QHS   lisinopril  40 mg Oral Daily   metroNIDAZOLE  500 mg Oral Q12H   mometasone-formoterol  2 puff Inhalation BID   multivitamin with minerals  1 tablet Oral Daily   thiamine  100 mg Oral Daily   Or   thiamine  100 mg Intravenous Daily   umeclidinium bromide  1 puff Inhalation Daily   venlafaxine XR  150 mg Oral Q breakfast   Continuous Infusions:   PRN Meds:.acetaminophen **OR** acetaminophen, haloperidol lactate, oxyCODONE    Subjective:   Lindsey Melton was seen and examined today.  No new complaints.   Objective:   Vitals:   06/28/22 1408 06/28/22 2005 06/29/22 0515 06/29/22 1239  BP: 134/86 (!) 110/57 127/60 (!) 109/58  Pulse: 68 72 60 88  Resp: 20 17 19 18   Temp: 98.4 F (36.9 C) 97.9 F (36.6 C) 97.9 F (36.6 C) 98.9 F (37.2 C)  TempSrc: Oral Oral Oral Oral  SpO2: 99% 95% 96% 96%  Weight:       Height:        Intake/Output Summary (Last 24 hours)  at 06/29/2022 1308 Last data filed at 06/28/2022 1858 Gross per 24 hour  Intake 1536 ml  Output --  Net 1536 ml    Filed Weights   06/18/22 2125 06/19/22 1419  Weight: 72.6 kg 73.6 kg     Exam General exam: Appears calm and comfortable  Respiratory system: Clear to auscultation. Respiratory effort normal. Cardiovascular system: S1 & S2 heard, RRR. No JVD,  Gastrointestinal system: Abdomen is nondistended, soft and nontender.  Central nervous system: Alert and oriented. No focal neurological deficits. Extremities: Symmetric 5 x 5 power. Skin: No rashes,  Psychiatry:  Mood & affect appropriate.          Data Reviewed:  I have personally reviewed following labs and imaging studies   CBC Lab Results  Component Value Date   WBC 6.5 06/24/2022   RBC 3.87 06/24/2022   HGB 11.4 (L) 06/24/2022   HCT 35.8 (L) 06/24/2022   MCV 92.5 06/24/2022   MCH 29.5 06/24/2022   PLT 390 06/24/2022   MCHC 31.8 06/24/2022   RDW 11.9 06/24/2022   LYMPHSABS 1.5 06/24/2022   MONOABS 0.6 06/24/2022   EOSABS 0.2 06/24/2022   BASOSABS 0.1 06/24/2022     Last metabolic panel Lab Results  Component Value Date   NA 142 06/25/2022   K 4.3 06/25/2022   CL 107 06/25/2022   CO2 25 06/25/2022   BUN 13 06/25/2022   CREATININE 0.76 06/25/2022   GLUCOSE 197 (H) 06/25/2022   GFRNONAA >60 06/25/2022   GFRAA >60 10/14/2019   CALCIUM 9.2 06/25/2022   PHOS 4.5 09/11/2015   PROT 6.3 (L) 06/21/2022   ALBUMIN 3.2 (L) 06/21/2022   LABGLOB 2.4 12/26/2015   AGRATIO 1.8 12/26/2015   BILITOT 0.3 06/21/2022   ALKPHOS 120 06/21/2022   AST 25 06/21/2022   ALT 20 06/21/2022   ANIONGAP 10 06/25/2022    CBG (last 3)  Recent Labs    06/28/22 2151 06/29/22 0714 06/29/22 1134  GLUCAP 220* 180* 239*       Coagulation Profile: No results for input(s): "INR", "PROTIME" in the last 168 hours.   Radiology Studies: No results  found.     Kathlen Mody M.D. [facility] 06/29/2022, 1:08 PM  Available via Epic secure chat 7am-7pm After 7 pm, please refer to night coverage provider listed on amion.

## 2022-06-29 NOTE — Progress Notes (Signed)
Mobility Specialist - Progress Note   06/29/22 1533  Mobility  Activity Ambulated with assistance in hallway  Level of Assistance Modified independent, requires aide device or extra time  Assistive Device Front wheel walker  Distance Ambulated (ft) 260 ft  Activity Response Tolerated well  Mobility Referral Yes  $Mobility charge 1 Mobility   Pt received in bed and agreeable to mobility. No complaints during mobility. Pt to bed after session with all needs met.    [facility]

## 2022-06-29 NOTE — TOC Progression Note (Addendum)
Transition of Care [facility]) - Progression Note    Patient Details  Name: Lindsey Melton MRN: [id number] Date of Birth: 1951-01-21  [facility] CM/SW Contact  Otelia Santee, LCSW Phone Number: 06/29/2022, 8:21 AM  Clinical Narrative:    Pt's insurance has requested peer to peer for auth request for SNF. Peer to peer must be completed by 12pm today, 06/29/22. Call in to [phone] opt 5. MD notified.    Update 10:25am- Pt's insurance has been approved for SNF placement at [facility]. Insurance is approved from 12/20 to 12/22. Auth ID: [id number].   Pt is able to transfer to SNF on 06/30/22.    Expected Discharge Plan: Skilled Nursing Facility Barriers to Discharge: SNF Covid  Expected Discharge Plan and Services Expected Discharge Plan: Skilled Nursing Facility In-house Referral: NA Discharge Planning Services: CM Consult Post Acute Care Choice: Skilled Nursing Facility Living arrangements for the past 2 months: Apartment                 DME Arranged: N/A DME Agency: NA                   Social Determinants of Health (SDOH) Interventions Food Insecurity Interventions: Inpatient TOC Housing Interventions: Intervention Not Indicated  Readmission Risk Interventions    06/25/2022    9:44 AM 06/23/2022    8:52 AM 06/21/2022    2:24 PM  Readmission Risk Prevention Plan  Transportation Screening Complete Complete Complete  PCP or Specialist Appt within 5-7 Days Complete Complete Complete  Home Care Screening Complete Complete Complete  Medication Review (RN CM) Complete Complete Complete

## 2022-06-30 DIAGNOSIS — G9341 Metabolic encephalopathy: Secondary | ICD-10-CM | POA: Diagnosis not present

## 2022-06-30 DIAGNOSIS — Z794 Long term (current) use of insulin: Secondary | ICD-10-CM | POA: Diagnosis not present

## 2022-06-30 DIAGNOSIS — E1169 Type 2 diabetes mellitus with other specified complication: Secondary | ICD-10-CM | POA: Diagnosis not present

## 2022-06-30 DIAGNOSIS — J189 Pneumonia, unspecified organism: Secondary | ICD-10-CM | POA: Diagnosis not present

## 2022-06-30 LAB — GLUCOSE, CAPILLARY
Glucose-Capillary: 181 mg/dL — ABNORMAL HIGH (ref 70–99)
Glucose-Capillary: 258 mg/dL — ABNORMAL HIGH (ref 70–99)

## 2022-06-30 MED ORDER — FOLIC ACID 1 MG PO TABS: 1.0000 mg | ORAL_TABLET | Freq: Every day | ORAL | Status: AC

## 2022-06-30 MED ORDER — INSULIN ASPART 100 UNIT/ML IJ SOLN: 0.0000 [IU] | Freq: Three times a day (TID) | INTRAMUSCULAR | 11 refills | Status: AC

## 2022-06-30 MED ORDER — VITAMIN B-1 100 MG PO TABS: 100.0000 mg | ORAL_TABLET | Freq: Every day | ORAL | Status: AC

## 2022-06-30 MED ORDER — ADULT MULTIVITAMIN W/MINERALS CH: 1.0000 | ORAL_TABLET | Freq: Every day | ORAL | Status: AC

## 2022-06-30 MED ORDER — CYANOCOBALAMIN 1000 MCG PO TABS: 1000.0000 ug | ORAL_TABLET | Freq: Every day | ORAL | Status: AC

## 2022-06-30 MED ORDER — INSULIN GLARGINE-YFGN 100 UNIT/ML ~~LOC~~ SOLN: 18.0000 [IU] | Freq: Every day | SUBCUTANEOUS | 11 refills | Status: AC

## 2022-06-30 NOTE — Discharge Summary (Signed)
Physician Discharge Summary  Lindsey Melton [id number] DOB: [birth year] DOA: 06/18/2022  PCP: Bridget Hartshorn, NP  Admit date: 06/18/2022 Discharge date: 06/30/2022 Discharging to: SNF     Discharge Diagnoses:   Principal Problem:   Acute metabolic encephalopathy Active Problems:   Pneumonia due to Covid and Parainfluenza infections   UTI (urinary tract infection)   Physical deconditioning   Alcohol abuse   Bipolar 1 disorder, depressed, moderate (HCC)   Type 2 diabetes mellitus ([place])   HLD (hyperlipidemia)   Hypertension associated with diabetes [facility])     Hospital Course:  This is a 71 y/o female with COPD, DM2 and substance abuse with crack cocaine and ETOH who presented to the hospital with confusion. In the ED she was noted to be positive for COVID and parainfluenza. CXR revealed patchy RLL opacity consistent with pneumonia. She was started on Paxlovid.   Principal Problem:   Acute metabolic encephalopathy - felt to be due to infections and has resolved - she is now alert and oriented  Active Problems:   Pneumonia due to Covid and Parainfluenza infections - improved- no wheezing or cough on exam - she has been declining to take her Paxlovid    UTI (urinary tract infection)? - urine grew out Gardnerella Vaginalis- she was treated with Flagyl    Physical deconditioning - will be transitioning to SNF    Alcohol abuse - advise to discontinue alcohol intake     Type 2 diabetes mellitus, uncontrolled with hyperglycemia - cont Lantus and Novolog Hemoglobin A1C    Component Value Date/Time   HGBA1C 10.8 (H) 06/19/2022 0417   HGBA1C 9.8 (H) 05/06/2016 1621    Obesity  Body mass index is 30.66 kg/m.              Discharge Instructions  Discharge Instructions     Diet - low sodium heart healthy   Complete by: As directed    Diet Carb Modified   Complete by: As directed    Increase activity slowly   Complete by: As directed        Allergies as of 06/30/2022       Reactions   Fluoxetine Hcl Itching, Other (See Comments)   REACTION: Jaundice--shut down liver function REACTION: Jaundice--shut down liver function REACTION: Jaundice--shut down liver function REACTION: Jaundice--shut down liver function REACTION: Jaundice--shut down liver function REACTION: Jaundice--shut down liver function REACTION: Jaundice--shut down liver function   Prozac [fluoxetine Hcl] Itching, Other (See Comments)   REACTION: Jaundice--shut down liver function   Gabapentin Other (See Comments)   Other reaction(s): Dizziness, Other (See Comments) dizziness        Medication List     STOP taking these medications    insulin glargine 100 UNIT/ML injection Commonly known as: LANTUS       TAKE these medications    ARIPiprazole 5 MG tablet Commonly known as: ABILIFY Take 5 mg by mouth daily.   atorvastatin 10 MG tablet Commonly known as: LIPITOR TAKE ONE TABLET BY MOUTH ONCE DAILY. What changed: how much to take   brimonidine 0.2 % ophthalmic solution Commonly known as: ALPHAGAN Place 1 drop into both eyes 3 (three) times daily.   cyanocobalamin 1000 MCG tablet Take 1 tablet (1,000 mcg total) by mouth daily. Start taking on: July 01, 2022   dorzolamide-timolol 2-0.5 % ophthalmic solution Commonly known as: COSOPT Place 1 drop into both eyes 2 (two) times daily.   famotidine 20 MG tablet Commonly known as: PEPCID Take 20  mg by mouth 2 (two) times daily.   fluticasone 50 MCG/ACT nasal spray Commonly known as: FLONASE Place 1 spray into both nostrils daily.   Fluticasone-Salmeterol 250-50 MCG/DOSE Aepb Commonly known as: ADVAIR Inhale 1 puff into the lungs 2 (two) times daily.   folic acid 1 MG tablet Commonly known as: FOLVITE Take 1 tablet (1 mg total) by mouth daily. Start taking on: July 01, 2022   hydrochlorothiazide 25 MG tablet Commonly known as: HYDRODIURIL Take 25 mg by mouth daily.    insulin aspart 100 UNIT/ML injection Commonly known as: novoLOG Inject 0-15 Units into the skin 3 (three) times daily with meals. CBG 70 - 120: 0 units  CBG 121 - 150: 2 units  CBG 151 - 200: 3 units  CBG 201 - 250: 5 units  CBG 251 - 300: 8 units  CBG 301 - 350: 11 units  CBG 351 - 400: 15 units   insulin glargine-yfgn 100 UNIT/ML injection Commonly known as: SEMGLEE Inject 0.18 mLs (18 Units total) into the skin daily. Start taking on: July 01, 2022   lamoTRIgine 100 MG tablet Commonly known as: LAMICTAL Take 250 mg by mouth daily.   latanoprost 0.005 % ophthalmic solution Commonly known as: XALATAN Place 1 drop into both eyes at bedtime.   lisinopril 40 MG tablet Commonly known as: ZESTRIL Take 40 mg by mouth daily.   meloxicam 7.5 MG tablet Commonly known as: MOBIC Take 7.5 mg by mouth daily.   montelukast 10 MG tablet Commonly known as: SINGULAIR Take 10 mg by mouth at bedtime.   multivitamin with minerals Tabs tablet Take 1 tablet by mouth daily. Start taking on: July 01, 2022   omeprazole 40 MG capsule Commonly known as: PRILOSEC Take 40 mg by mouth 2 (two) times daily.   thiamine 100 MG tablet Commonly known as: Vitamin B-1 Take 1 tablet (100 mg total) by mouth daily. Start taking on: July 01, 2022   umeclidinium bromide 62.5 MCG/ACT Aepb Commonly known as: INCRUSE ELLIPTA Inhale 1 puff into the lungs daily.   venlafaxine XR 150 MG 24 hr capsule Commonly known as: EFFEXOR-XR Take 150 mg by mouth daily with breakfast.   Ventolin HFA 108 (90 Base) MCG/ACT inhaler Generic drug: albuterol INHALE 2 PUFFS INTO THE LUNGS EVERY 4 HOURS AS NEEDED. What changed: See the new instructions.        Contact information for after-discharge care     Destination     [facility] Preferred SNF .   Service: Skilled Nursing Contact information: [address] [place] [address] [phone]                         The results of significant diagnostics from this hospitalization (including imaging, microbiology, ancillary and laboratory) are listed below for reference.    DG Chest 2 View  Result Date: 06/18/2022 CLINICAL DATA:  Altered mental status. EXAM: CHEST - 2 VIEW COMPARISON:  10/14/2019 FINDINGS: The heart size and mediastinal contours are stable. There is atherosclerotic calcification of the aorta. Mild airspace disease is present at the right lung base. No effusion or pneumothorax. Degenerative changes are present in the thoracolumbar spine. No acute osseous abnormality. IMPRESSION: Mild patchy airspace disease at the right lung base, possible developing pneumonia. Electronically Signed   By: Thornell Sartorius M.D.   On: 06/18/2022 22:59   CT Head Wo Contrast  Result Date: 06/18/2022 CLINICAL DATA:  Mental status change, unknown cause.  EXAM: CT HEAD WITHOUT CONTRAST TECHNIQUE: Contiguous axial images were obtained from the base of the skull through the vertex without intravenous contrast. RADIATION DOSE REDUCTION: This exam was performed according to the departmental dose-optimization program which includes automated exposure control, adjustment of the mA and/or kV according to patient size and/or use of iterative reconstruction technique. COMPARISON:  09/22/2021. FINDINGS: Brain: No acute intracranial hemorrhage, midline shift or mass effect. No extra-axial fluid collection. Diffuse atrophy is noted. Periventricular white matter hypodensities are present bilaterally. No hydrocephalus. Vascular: No hyperdense vessel or unexpected calcification. Skull: No acute fracture. Sinuses/Orbits: Mucosal thickening in the ethmoid air cells, sphenoid sinuses, maxillary sinus on the left. No acute orbital abnormality. Other: None. IMPRESSION: No acute intracranial process. Electronically Signed   By: Brett Fairy M.D.   On: 06/18/2022 22:58   Labs:   Basic Metabolic Panel: Recent Labs  Lab  06/24/22 0515 06/25/22 0610  NA 143 142  K 4.5 4.3  CL 108 107  CO2 27 25  GLUCOSE 184* 197*  BUN 17 13  CREATININE 0.91 0.76  CALCIUM 9.2 9.2  MG 2.1 2.1     CBC: Recent Labs  Lab 06/24/22 0515  WBC 6.5  NEUTROABS 4.0  HGB 11.4*  HCT 35.8*  MCV 92.5  PLT 390         SIGNED:   Debbe Odea, MD  [facility] 06/30/2022, 9:24 AM

## 2022-06-30 NOTE — Plan of Care (Signed)

## 2022-06-30 NOTE — TOC Transition Note (Signed)
Transition of Care [facility]) - CM/SW Discharge Note   Patient Details  Name: Lindsey Melton MRN: [id number] Date of Birth: Jun 18, 1951  Transition of [facility]) CM/SW Contact:  Otelia Santee, LCSW Phone Number: 06/30/2022, 9:59 AM   Clinical Narrative:    Pt is to transfer to [facility] for SNF and will be going to room 3246. RN to call report to [phone]. CSW spoke with pt's daughter who is agreeable to discharge plans. Pt's grandson is to provide transportation for pt to facility and will be able to pick pt up around 3:30pm.    Final next level of care: Skilled Nursing Facility Barriers to Discharge: Barriers Resolved   Patient Goals and CMS Choice Patient states their goals for this hospitalization and ongoing recovery are:: For patient to transfer to SNF CMS Medicare.gov Compare Post Acute Care list provided to:: Patient Represenative (must comment) (Daughter) Choice offered to / list presented to : Adult Children    Discharge Placement   Existing PASRR number confirmed : 06/21/22          Patient chooses bed at: [facility] Patient to be transferred to facility by: Family- Grandson Name of family member notified: Daughter, Adalis Gatti Patient and family notified of of transfer: 06/30/22  Discharge Plan and Services In-house Referral: NA Discharge Planning Services: CM Consult Post Acute Care Choice: Skilled Nursing Facility          DME Arranged: N/A DME Agency: NA                  Social Determinants of Health (SDOH) Interventions Food Insecurity Interventions: Inpatient TOC Housing Interventions: Intervention Not Indicated   Readmission Risk Interventions    06/30/2022    9:56 AM 06/25/2022    9:44 AM 06/23/2022    8:52 AM  Readmission Risk Prevention Plan  Transportation Screening Complete Complete Complete  PCP or Specialist Appt within 5-7 Days Complete Complete Complete  Home Care Screening Complete Complete Complete   Medication Review (RN CM) Complete Complete Complete

## 2022-09-09 ENCOUNTER — Encounter: Payer: Self-pay | Admitting: Radiology

## 2023-11-09 ENCOUNTER — Ambulatory Visit
Admission: EM | Admit: 2023-11-09 | Discharge: 2023-11-09 | Disposition: A | Discharge status: Home / Self Care | Attending: Nurse Practitioner | Admitting: Nurse Practitioner

## 2023-11-09 DIAGNOSIS — R339 Retention of urine, unspecified: Secondary | ICD-10-CM

## 2023-11-09 DIAGNOSIS — K5900 Constipation, unspecified: Secondary | ICD-10-CM

## 2023-11-09 DIAGNOSIS — R8290 Unspecified abnormal findings in urine: Secondary | ICD-10-CM

## 2023-11-09 LAB — POCT URINALYSIS DIP (MANUAL ENTRY)
Bilirubin, UA: NEGATIVE
Blood, UA: NEGATIVE
Glucose, UA: NEGATIVE mg/dL
Ketones, POC UA: NEGATIVE mg/dL
Nitrite, UA: NEGATIVE
Protein Ur, POC: NEGATIVE mg/dL
Spec Grav, UA: 1.015
Urobilinogen, UA: 1 U/dL
pH, UA: 6

## 2023-11-09 MED ORDER — SENNOSIDES-DOCUSATE SODIUM 8.6-50 MG PO TABS: 2.0000 | ORAL_TABLET | Freq: Every evening | ORAL | 0 refills | Status: AC | PRN

## 2023-11-09 MED ORDER — CEPHALEXIN 500 MG PO CAPS: 500.0000 mg | ORAL_CAPSULE | Freq: Four times a day (QID) | ORAL | 0 refills | Status: AC

## 2023-11-09 MED ORDER — POLYETHYLENE GLYCOL 3350 17 G PO PACK: 17.0000 g | PACK | Freq: Every day | ORAL | 0 refills | Status: AC

## 2023-11-09 NOTE — Discharge Instructions (Signed)
 There were abnormal findings on her urinalysis.  A urine culture has been ordered.  You will be contacted if the urine culture shows that the medication prescribed today needs to be changed, or if the culture shows that she does not have a urinary tract infection and the medication needs to be stopped. Administer medication as prescribed. Increase her fluid intake.  She should be drinking at least 8-10 8 ounce glasses of water daily. May take over-the-counter Tylenol  as needed for pain or discomfort. Develop a toileting schedule that allows her to urinate at least every 2 hours. Avoid caffeine while symptoms persist, this includes tea, soda, and coffee. If symptoms do not improve with this treatment, or if her urine culture is negative, please follow-up with her primary care physician for further evaluation.  For her constipation: Make sure she is drinking at least 8-10 8 ounce glasses of water daily. She may have over-the-counter Tylenol  as needed for pain, fever, or general discomfort. Recommend a diet that is high in fiber.  She should also be eating plenty of fruits and vegetables. If her symptoms fail to improve, please follow-up with her primary care physician for further evaluation.  Follow-up as needed. Follow-up as needed.

## 2023-11-09 NOTE — ED Provider Notes (Signed)
 [facility]    CSN: [id number] Arrival date & time: 11/09/23  1536      History   Chief Complaint No chief complaint on file.   HPI Lindsey Melton is a 73 y.o. female.   The history is provided by the patient.   Patient presents with her daughter for complaints of constipation and difficulty urinating.  Patient states has been several days since she has had a regular bowel movement.  States that she had a bowel movement today but it was "mushy balls."  Patient states that she felt better after she had an episode of flatulence to help relieve some of the pressure in her abdomen.  Patient states that for the past several days, she has also had difficulty starting her urine stream.  She states that she has to press on her pelvic region in order to start urinating.  She denies urinary frequency, urgency, dysuria, hematuria, flank pain, or low back pain.  Review of the chart shows the patient does not take any opioid medications or antimuscarinics.  Past Medical History:  Diagnosis Date   Anxiety    Arthritis    Asthma    Cataract    Cervical myelopathy (HCC)    COPD (chronic obstructive pulmonary disease) (HCC)    Depression    Diabetes mellitus    Headache    History of hiatal hernia    Hyperlipidemia    Hypertension    Kidney stones    Neuromuscular disorder (HCC)    Peripheral vertigo    Substance abuse (HCC)    Wears glasses     Patient Active Problem List   Diagnosis Date Noted   Acute metabolic encephalopathy 06/19/2022   Pneumonia due to Covid and Parainfluenza infections 06/19/2022   Alcohol abuse 06/19/2022   Physical deconditioning 06/19/2022   Closed trimalleolar fracture of right ankle 10/07/20 ORIF  10/14/2020   Bipolar 1 disorder, depressed, moderate (HCC) 10/17/2019   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, moderate stage 04/24/2019   Intermediate stage nonexudative age-related macular degeneration of both eyes  04/24/2019   Nonproliferative diabetic retinopathy associated with type 2 diabetes mellitus (HCC) 04/24/2019   Combined forms of age-related cataract of left eye 03/21/2019   Seasonal allergic rhinitis due to pollen 10/23/2018   Gastroesophageal reflux disease without esophagitis 09/19/2018   Personality disorder (HCC) 09/05/2017   Asthma 10/12/2016   HLD (hyperlipidemia) 10/12/2016   History of drug abuse (HCC) 10/12/2016   Hypertension associated with diabetes (HCC) 10/12/2016   Major depressive disorder 02/26/2016   Cocaine abuse in remission (HCC) 02/26/2016   Alcoholism in remission (HCC) 02/26/2016   Healthcare maintenance 12/26/2015   Cervical myelopathy with cervical radiculopathy (HCC) 09/16/2015   Benign essential HTN    Type 2 diabetes mellitus (HCC)    Chronic obstructive pulmonary disease (HCC)    Post-operative pain    Acute blood loss anemia    Dysphagia    Myelopathy (HCC) 09/10/2015   S/P cervical spinal fusion    Surgery, elective    Postprocedural hypotension    Spondylosis, cervical, with myelopathy 08/24/2015   Polysubstance abuse (HCC) 08/24/2015   UTI (urinary tract infection) 08/18/2015   Cold extremities 08/08/2015   Leg pain 08/08/2015   Muscle weakness 07/29/2015   Neck pain 07/25/2015   Hyponatremia 07/25/2015    Past Surgical History:  Procedure Laterality Date   ABDOMINAL HYSTERECTOMY     ANTERIOR CERVICAL DECOMP/DISCECTOMY FUSION N/A 09/10/2015   Procedure:  ANTERIOR C3-4 DECOMPRESSION AND FUSION;  Surgeon: Mort Ards, MD;  Location: [facility] OR;  Service: Orthopedics;  Laterality: N/A;   APPENDECTOMY     ORIF ANKLE FRACTURE Right 10/07/2020   Procedure: OPEN REDUCTION INTERNAL FIXATION (ORIF) RIGHT ANKLE FRACTURE;  Surgeon: Darrin Emerald, MD;  Location: [facility];  Service: Orthopedics;  Laterality: Right;   POSTERIOR CERVICAL FUSION/FORAMINOTOMY N/A 09/10/2015   Procedure: POSTERIOR CERVICAL FUSION/FORAMINOTOMY Cervical 3-4;  Surgeon: Mort Ards, MD;  Location: [facility] OR;  Service: Orthopedics;  Laterality: N/A;    OB History   No obstetric history on file.      Home Medications    Prior to Admission medications   Medication Sig Start Date End Date Taking? Authorizing Provider  cephALEXin  (KEFLEX ) 500 MG capsule Take 1 capsule (500 mg total) by mouth 4 (four) times daily. 11/09/23  Yes Leath-Warren, Belen Bowers, NP  polyethylene glycol (MIRALAX ) 17 g packet Take 17 g by mouth daily. 11/09/23  Yes Leath-Warren, Belen Bowers, NP  senna-docusate (SENOKOT-S) 8.6-50 MG tablet Take 2 tablets by mouth at bedtime as needed for up to 15 days for mild constipation. 11/09/23 11/24/23 Yes Leath-Warren, Belen Bowers, NP  ARIPiprazole  (ABILIFY ) 5 MG tablet Take 5 mg by mouth daily.    [provider]  atorvastatin  (LIPITOR) 10 MG tablet TAKE ONE TABLET BY MOUTH ONCE DAILY. Patient taking differently: Take 20 mg by mouth daily. 08/13/16   Leoma Raja, MD  brimonidine  (ALPHAGAN ) 0.2 % ophthalmic solution Place 1 drop into both eyes 3 (three) times daily. 09/24/19   [provider]  cyanocobalamin  1000 MCG tablet Take 1 tablet (1,000 mcg total) by mouth daily. 07/01/22   Rizwan, Saima, MD  dorzolamide -timolol  (COSOPT ) 22.3-6.8 MG/ML ophthalmic solution Place 1 drop into both eyes 2 (two) times daily. 09/14/19   [provider]  famotidine (PEPCID) 20 MG tablet Take 20 mg by mouth 2 (two) times daily. 09/09/21   [provider]  fluticasone  (FLONASE) 50 MCG/ACT nasal spray Place 1 spray into both nostrils daily.    [provider]  Fluticasone -Salmeterol (ADVAIR) 250-50 MCG/DOSE AEPB Inhale 1 puff into the lungs 2 (two) times daily.    [provider]  folic acid  (FOLVITE ) 1 MG tablet Take 1 tablet (1 mg total) by mouth daily. 07/01/22   Rizwan, Saima, MD  hydrochlorothiazide (HYDRODIURIL) 25 MG tablet Take 25 mg by mouth daily. 09/12/21   [provider]  insulin  aspart (NOVOLOG ) 100 UNIT/ML  injection Inject 0-15 Units into the skin 3 (three) times daily with meals. CBG 70 - 120: 0 units  CBG 121 - 150: 2 units  CBG 151 - 200: 3 units  CBG 201 - 250: 5 units  CBG 251 - 300: 8 units  CBG 301 - 350: 11 units  CBG 351 - 400: 15 units 06/30/22   Rizwan, Saima, MD  insulin  glargine-yfgn (SEMGLEE ) 100 UNIT/ML injection Inject 0.18 mLs (18 Units total) into the skin daily. 07/01/22   Rizwan, Saima, MD  lamoTRIgine  (LAMICTAL ) 100 MG tablet Take 250 mg by mouth daily. 09/17/19   [provider]  latanoprost  (XALATAN ) 0.005 % ophthalmic solution Place 1 drop into both eyes at bedtime. 09/21/19   [provider]  lisinopril  (ZESTRIL ) 40 MG tablet Take 40 mg by mouth daily. 08/26/21   [provider]  meloxicam (MOBIC) 7.5 MG tablet Take 7.5 mg by mouth daily. 10/11/19   [provider]  montelukast (SINGULAIR) 10 MG tablet Take 10 mg by  mouth at bedtime.    [provider]  Multiple Vitamin (MULTIVITAMIN WITH MINERALS) TABS tablet Take 1 tablet by mouth daily. 07/01/22   Rizwan, Saima, MD  omeprazole (PRILOSEC) 40 MG capsule Take 40 mg by mouth 2 (two) times daily. 09/28/19   [provider]  thiamine  (VITAMIN B-1) 100 MG tablet Take 1 tablet (100 mg total) by mouth daily. 07/01/22   Rizwan, Saima, MD  umeclidinium bromide  (INCRUSE ELLIPTA ) 62.5 MCG/ACT AEPB Inhale 1 puff into the lungs daily. 10/06/21   [provider]  venlafaxine  XR (EFFEXOR -XR) 150 MG 24 hr capsule Take 150 mg by mouth daily with breakfast.    [provider]  VENTOLIN  HFA 108 (90 Base) MCG/ACT inhaler INHALE 2 PUFFS INTO THE LUNGS EVERY 4 HOURS AS NEEDED. Patient taking differently: Inhale 2 puffs into the lungs every 4 (four) hours as needed for wheezing. 07/13/16   Leoma Raja, MD    Family History Family History  Problem Relation Age of Onset   Arthritis Mother    Diabetes Mother    Hearing loss Mother    Heart disease Mother    Hypertension  Mother    Vision loss Mother    Stroke Mother    Asthma Mother    Heart disease Father    Depression Daughter    Mental illness Daughter    Vision loss Daughter    Anxiety disorder Daughter    Alcohol abuse Son    Drug abuse Son    Anxiety disorder Son     Social History Social History   Tobacco Use   Smoking status: Former    Types: Cigarettes   Smokeless tobacco: Never  Vaping Use   Vaping status: Never Used  Substance Use Topics   Alcohol use: Yes    Alcohol/week: 10.0 standard drinks of alcohol    Types: 10 Shots of liquor per week   Drug use: Not Currently    Types: "Crack" cocaine    Comment: last used crack cocaine in December 2016     Allergies   Fluoxetine hcl, Prozac [fluoxetine hcl], and Gabapentin   Review of Systems Review of Systems Per HPI  Physical Exam Triage Vital Signs ED Triage Vitals  Encounter Vitals Group     BP 11/09/23 1546 135/77     Systolic BP Percentile --      Diastolic BP Percentile --      Pulse Rate 11/09/23 1546 94     Resp 11/09/23 1546 16     Temp 11/09/23 1546 97.7 F (36.5 C)     Temp src --      SpO2 11/09/23 1546 95 %     Weight --      Height --      Head Circumference --      Peak Flow --      Pain Score 11/09/23 1552 0     Pain Loc --      Pain Education --      Exclude from Growth Chart --    No data found.  Updated Vital Signs BP 135/77 (BP Location: Right Arm)   Pulse 94   Temp 97.7 F (36.5 C)   Resp 16   SpO2 95%   Visual Acuity Right Eye Distance:   Left Eye Distance:   Bilateral Distance:    Right Eye Near:   Left Eye Near:    Bilateral Near:     Physical Exam Vitals and nursing note reviewed.  Constitutional:      General: She is not in acute distress.    Appearance: Normal appearance.  HENT:     Head: Normocephalic.  Eyes:     Extraocular Movements: Extraocular movements intact.     Pupils: Pupils are equal, round, and reactive to light.  Cardiovascular:     Rate and  Rhythm: Normal rate and regular rhythm.     Pulses: Normal pulses.     Heart sounds: Normal heart sounds.  Pulmonary:     Effort: Pulmonary effort is normal. No respiratory distress.     Breath sounds: Normal breath sounds. No stridor. No wheezing, rhonchi or rales.  Abdominal:     General: Bowel sounds are normal.     Palpations: Abdomen is soft.     Tenderness: There is no abdominal tenderness. There is no right CVA tenderness or left CVA tenderness.  Musculoskeletal:     Cervical back: Normal range of motion.  Skin:    General: Skin is warm and dry.  Neurological:     General: No focal deficit present.     Mental Status: She is alert and oriented to person, place, and time.  Psychiatric:        Mood and Affect: Mood normal.        Behavior: Behavior normal.     UC Treatments / Results  Labs (all labs ordered are listed, but only abnormal results are displayed) Labs Reviewed  POCT URINALYSIS DIP (MANUAL ENTRY) - Abnormal; Notable for the following components:      Result Value   Clarity, UA cloudy (*)    Leukocytes, UA Small (1+) (*)    All other components within normal limits  URINE CULTURE    EKG   Radiology No results found.  Procedures Procedures (including critical care time)  Medications Ordered in UC Medications - No data to display  Initial Impression / Assessment and Plan / UC Course  I have reviewed the triage vital signs and the nursing notes.  Pertinent labs & imaging results that were available during my care of the patient were reviewed by me and considered in my medical decision making (see chart for details).  On exam, lung sounds are clear throughout, room air sats at 95%.  Urinalysis does show leukocytes in color is cloudy.  Given patient's current symptoms of urinary retention, urine culture is pending.  Will treat empirically with Keflex  500 mg 4 times daily to cover for UTI.  With regard to her constipation, we will start MiraLAX  17 g and  Senokot 8.6/50 mg tablets and stool softeners to help with symptoms of constipation.  Supportive care recommendations were provided and discussed with the patient and her daughter to include fluids, over-the-counter analgesics, developing a toileting schedule, avoiding caffeine, and for constipation, increasing the fruits and vegetables in her diet, along with increasing her fluid intake.  Follow-up precautions were discussed with the patient's daughter.  Daughter was in agreement with this plan of care and verbalized understanding.  All questions were answered.  Patient stable for discharge.  Final Clinical Impressions(s) / UC Diagnoses   Final diagnoses:  Urinary retention  Abnormal urinalysis  Constipation, unspecified constipation type     Discharge Instructions      There were abnormal findings on her urinalysis.  A urine culture has been ordered.  You will be contacted if the urine culture shows that the medication prescribed today needs to be changed, or if the culture shows that she does not have  a urinary tract infection and the medication needs to be stopped. Administer medication as prescribed. Increase her fluid intake.  She should be drinking at least 8-10 8 ounce glasses of water daily. May take over-the-counter Tylenol  as needed for pain or discomfort. Develop a toileting schedule that allows her to urinate at least every 2 hours. Avoid caffeine while symptoms persist, this includes tea, soda, and coffee. If symptoms do not improve with this treatment, or if her urine culture is negative, please follow-up with her primary care physician for further evaluation.  For her constipation: Make sure she is drinking at least 8-10 8 ounce glasses of water daily. She may have over-the-counter Tylenol  as needed for pain, fever, or general discomfort. Recommend a diet that is high in fiber.  She should also be eating plenty of fruits and vegetables. If her symptoms fail to improve,  please follow-up with her primary care physician for further evaluation.  Follow-up as needed. Follow-up as needed.     ED Prescriptions     Medication Sig Dispense Auth. Provider   cephALEXin  (KEFLEX ) 500 MG capsule Take 1 capsule (500 mg total) by mouth 4 (four) times daily. 28 capsule Leath-Warren, Belen Bowers, NP   polyethylene glycol (MIRALAX ) 17 g packet Take 17 g by mouth daily. 24 each Leath-Warren, Belen Bowers, NP   senna-docusate (SENOKOT-S) 8.6-50 MG tablet Take 2 tablets by mouth at bedtime as needed for up to 15 days for mild constipation. 30 tablet Leath-Warren, Belen Bowers, NP      PDMP not reviewed this encounter.   Hardy Lia, NP 11/09/23 1645

## 2023-11-09 NOTE — ED Triage Notes (Signed)
 Pt reports difficulty making Bowel movements 3 wks and urinating x 2 mos. Pt states they move but it's "like mush" or "comes out in balls" pt states she has has tried laxatives. Stool is mostly solid when having bowel movement. Pt states she has a hemorrhoid that causes discomfort when "squeezing out the stool"

## 2023-11-11 LAB — URINE CULTURE: Culture: 20000 — AB

## 2023-12-18 ENCOUNTER — Emergency Department (HOSPITAL_COMMUNITY)
Admission: EM | Admit: 2023-12-18 | Discharge: 2023-12-18 | Attending: Emergency Medicine | Admitting: Emergency Medicine

## 2023-12-18 ENCOUNTER — Emergency Department (HOSPITAL_COMMUNITY)

## 2023-12-18 ENCOUNTER — Encounter (HOSPITAL_COMMUNITY): Payer: Self-pay

## 2023-12-18 ENCOUNTER — Other Ambulatory Visit: Payer: Self-pay

## 2023-12-18 DIAGNOSIS — R0602 Shortness of breath: Secondary | ICD-10-CM | POA: Insufficient documentation

## 2023-12-18 DIAGNOSIS — E871 Hypo-osmolality and hyponatremia: Secondary | ICD-10-CM | POA: Insufficient documentation

## 2023-12-18 DIAGNOSIS — R059 Cough, unspecified: Secondary | ICD-10-CM | POA: Diagnosis not present

## 2023-12-18 DIAGNOSIS — Z794 Long term (current) use of insulin: Secondary | ICD-10-CM | POA: Diagnosis not present

## 2023-12-18 DIAGNOSIS — Z79899 Other long term (current) drug therapy: Secondary | ICD-10-CM | POA: Insufficient documentation

## 2023-12-18 DIAGNOSIS — R079 Chest pain, unspecified: Secondary | ICD-10-CM | POA: Insufficient documentation

## 2023-12-18 DIAGNOSIS — R944 Abnormal results of kidney function studies: Secondary | ICD-10-CM | POA: Diagnosis not present

## 2023-12-18 DIAGNOSIS — R062 Wheezing: Secondary | ICD-10-CM | POA: Diagnosis not present

## 2023-12-18 DIAGNOSIS — R051 Acute cough: Secondary | ICD-10-CM

## 2023-12-18 LAB — CBC WITH DIFFERENTIAL/PLATELET
Abs Immature Granulocytes: 0.03 10*3/uL (ref 0.00–0.07)
Basophils Absolute: 0.1 10*3/uL (ref 0.0–0.1)
Basophils Relative: 1 %
Eosinophils Absolute: 0.3 10*3/uL (ref 0.0–0.5)
Eosinophils Relative: 3 %
HCT: 39.7 % (ref 36.0–46.0)
Hemoglobin: 13.2 g/dL (ref 12.0–15.0)
Immature Granulocytes: 0 %
Lymphocytes Relative: 27 %
Lymphs Abs: 2.3 10*3/uL (ref 0.7–4.0)
MCH: 30.3 pg (ref 26.0–34.0)
MCHC: 33.2 g/dL (ref 30.0–36.0)
MCV: 91.1 fL (ref 80.0–100.0)
Monocytes Absolute: 0.8 10*3/uL (ref 0.1–1.0)
Monocytes Relative: 9 %
Neutro Abs: 5.3 10*3/uL (ref 1.7–7.7)
Neutrophils Relative %: 60 %
Platelets: 392 10*3/uL (ref 150–400)
RBC: 4.36 MIL/uL (ref 3.87–5.11)
RDW: 12.1 % (ref 11.5–15.5)
WBC: 8.8 10*3/uL (ref 4.0–10.5)
nRBC: 0 % (ref 0.0–0.2)

## 2023-12-18 LAB — RESP PANEL BY RT-PCR (RSV, FLU A&B, COVID)  RVPGX2
Influenza A by PCR: NEGATIVE
Influenza B by PCR: NEGATIVE
Resp Syncytial Virus by PCR: NEGATIVE
SARS Coronavirus 2 by RT PCR: NEGATIVE

## 2023-12-18 LAB — COMPREHENSIVE METABOLIC PANEL WITH GFR
ALT: 19 U/L (ref 0–44)
AST: 26 U/L (ref 15–41)
Albumin: 4.3 g/dL (ref 3.5–5.0)
Alkaline Phosphatase: 152 U/L — ABNORMAL HIGH (ref 38–126)
Anion gap: 12 (ref 5–15)
BUN: 18 mg/dL (ref 8–23)
CO2: 20 mmol/L — ABNORMAL LOW (ref 22–32)
Calcium: 9 mg/dL (ref 8.9–10.3)
Chloride: 98 mmol/L (ref 98–111)
Creatinine, Ser: 1.09 mg/dL — ABNORMAL HIGH (ref 0.44–1.00)
GFR, Estimated: 54 mL/min — ABNORMAL LOW (ref >60)
Glucose, Bld: 122 mg/dL — ABNORMAL HIGH (ref 70–99)
Potassium: 3.8 mmol/L (ref 3.5–5.1)
Sodium: 130 mmol/L — ABNORMAL LOW (ref 135–145)
Total Bilirubin: 0.4 mg/dL (ref 0.0–1.2)
Total Protein: 6.7 g/dL (ref 6.5–8.1)

## 2023-12-18 LAB — BLOOD GAS, VENOUS
Acid-Base Excess: 1.5 mmol/L (ref 0.0–2.0)
Bicarbonate: 24.7 mmol/L (ref 20.0–28.0)
Drawn by: 8368
O2 Saturation: 95.5 %
Patient temperature: 36.6
pCO2, Ven: 33 mmHg — ABNORMAL LOW (ref 44–60)
pH, Ven: 7.48 — ABNORMAL HIGH (ref 7.25–7.43)
pO2, Ven: 68 mmHg — ABNORMAL HIGH (ref 32–45)

## 2023-12-18 LAB — BRAIN NATRIURETIC PEPTIDE: B Natriuretic Peptide: 34 pg/mL (ref 0.0–100.0)

## 2023-12-18 LAB — TROPONIN I (HIGH SENSITIVITY): Troponin I (High Sensitivity): 3 ng/L (ref <18)

## 2023-12-18 MED ORDER — METHYLPREDNISOLONE SODIUM SUCC 125 MG IJ SOLR
125.0000 mg | Freq: Once | INTRAMUSCULAR | Status: AC
  Administered 2023-12-18: 125 mg via INTRAVENOUS
  Filled 2023-12-18: qty 2

## 2023-12-18 MED ORDER — SODIUM CHLORIDE 0.9 % IV BOLUS
1000.0000 mL | Freq: Once | INTRAVENOUS | Status: AC
  Administered 2023-12-18: 1000 mL via INTRAVENOUS

## 2023-12-18 MED ORDER — IPRATROPIUM-ALBUTEROL 0.5-2.5 (3) MG/3ML IN SOLN
3.0000 mL | Freq: Once | RESPIRATORY_TRACT | Status: AC
  Administered 2023-12-18: 3 mL via RESPIRATORY_TRACT
  Filled 2023-12-18: qty 3

## 2023-12-18 MED ORDER — BENZONATATE 100 MG PO CAPS
100.0000 mg | ORAL_CAPSULE | Freq: Three times a day (TID) | ORAL | 0 refills | Status: AC
Start: 2023-12-18 — End: ?

## 2023-12-18 MED ORDER — HYDROCOD POLI-CHLORPHE POLI ER 10-8 MG/5ML PO SUER
5.0000 mL | Freq: Once | ORAL | Status: AC
  Administered 2023-12-18: 5 mL via ORAL
  Filled 2023-12-18: qty 5

## 2023-12-18 MED ORDER — IOHEXOL 350 MG/ML SOLN
75.0000 mL | Freq: Once | INTRAVENOUS | Status: AC | PRN
Start: 2023-12-18 — End: 2023-12-18
  Administered 2023-12-18: 75 mL via INTRAVENOUS

## 2023-12-18 MED ORDER — PREDNISONE 10 MG PO TABS: 40.0000 mg | ORAL_TABLET | Freq: Every day | ORAL | 0 refills | Status: AC

## 2023-12-18 MED ORDER — DOXYCYCLINE HYCLATE 100 MG PO CAPS: 100.0000 mg | ORAL_CAPSULE | Freq: Two times a day (BID) | ORAL | 0 refills | Status: AC

## 2023-12-18 NOTE — ED Notes (Signed)
 Patient Daughter called and informed of patient is here at the hospital per patient request.

## 2023-12-18 NOTE — ED Triage Notes (Signed)
 Patient BIB [facility] from [facility], Patient complaint of Shortness of breath, chest pain mid-sternum tenderness and with deep breathing, Cough that has been gooing on for a month, Hx of Asthma, Diabetes. CBG 124. Oxygen  level per EMS 96% patient placed on 2L of Nasal Cannula. Per EMS Facility has no AC at this time.

## 2023-12-18 NOTE — ED Provider Notes (Signed)
 [facility] AT [facility] Provider Note   CSN: [id number] Arrival date & time: 12/18/23  1319     History  Chief Complaint  Patient presents with   Shortness of Breath   Chest Pain   Cough    Lindsey Melton is a 73 y.o. female.  Patient is a 73 year old female who presents to the emergency department with a chief complaint of cough which has been ongoing for approximate the past month, chest pain and shortness of breath.  She notes that the chest pain is reproducible with palpation over her chest and notes that the pain is reproducible with cough.  Patient does not wear oxygen  at home and was only placed on oxygen  by EMS for comfort.  Patient denies any abdominal pain, nausea, vomiting, diarrhea.  She denies any increased lower extremity pain or edema, hemoptysis.  She has had no associated dizziness, lightheadedness or syncope.   Shortness of Breath Associated symptoms: chest pain and cough   Chest Pain Associated symptoms: cough and shortness of breath   Cough Associated symptoms: chest pain and shortness of breath        Home Medications Prior to Admission medications   Medication Sig Start Date End Date Taking? Authorizing Provider  ARIPiprazole  (ABILIFY ) 5 MG tablet Take 5 mg by mouth daily.    [provider]  atorvastatin  (LIPITOR) 10 MG tablet TAKE ONE TABLET BY MOUTH ONCE DAILY. Patient taking differently: Take 20 mg by mouth daily. 08/13/16   Leoma Raja, MD  brimonidine  (ALPHAGAN ) 0.2 % ophthalmic solution Place 1 drop into both eyes 3 (three) times daily. 09/24/19   [provider]  cephALEXin  (KEFLEX ) 500 MG capsule Take 1 capsule (500 mg total) by mouth 4 (four) times daily. 11/09/23   Leath-Warren, Belen Bowers, NP  cyanocobalamin  1000 MCG tablet Take 1 tablet (1,000 mcg total) by mouth daily. 07/01/22   Rizwan, Saima, MD  dorzolamide -timolol  (COSOPT ) 22.3-6.8 MG/ML ophthalmic solution Place 1 drop into both  eyes 2 (two) times daily. 09/14/19   [provider]  famotidine (PEPCID) 20 MG tablet Take 20 mg by mouth 2 (two) times daily. 09/09/21   [provider]  fluticasone  (FLONASE) 50 MCG/ACT nasal spray Place 1 spray into both nostrils daily.    [provider]  Fluticasone -Salmeterol (ADVAIR) 250-50 MCG/DOSE AEPB Inhale 1 puff into the lungs 2 (two) times daily.    [provider]  folic acid  (FOLVITE ) 1 MG tablet Take 1 tablet (1 mg total) by mouth daily. 07/01/22   Rizwan, Saima, MD  hydrochlorothiazide (HYDRODIURIL) 25 MG tablet Take 25 mg by mouth daily. 09/12/21   [provider]  insulin  aspart (NOVOLOG ) 100 UNIT/ML injection Inject 0-15 Units into the skin 3 (three) times daily with meals. CBG 70 - 120: 0 units  CBG 121 - 150: 2 units  CBG 151 - 200: 3 units  CBG 201 - 250: 5 units  CBG 251 - 300: 8 units  CBG 301 - 350: 11 units  CBG 351 - 400: 15 units 06/30/22   Rizwan, Saima, MD  insulin  glargine-yfgn (SEMGLEE ) 100 UNIT/ML injection Inject 0.18 mLs (18 Units total) into the skin daily. 07/01/22   Rizwan, Saima, MD  lamoTRIgine  (LAMICTAL ) 100 MG tablet Take 250 mg by mouth daily. 09/17/19   [provider]  latanoprost  (XALATAN ) 0.005 % ophthalmic solution Place 1 drop into both eyes at bedtime. 09/21/19   [provider]  lisinopril  (ZESTRIL ) 40 MG tablet  Take 40 mg by mouth daily. 08/26/21   [provider]  meloxicam (MOBIC) 7.5 MG tablet Take 7.5 mg by mouth daily. 10/11/19   [provider]  montelukast (SINGULAIR) 10 MG tablet Take 10 mg by mouth at bedtime.    [provider]  Multiple Vitamin (MULTIVITAMIN WITH MINERALS) TABS tablet Take 1 tablet by mouth daily. 07/01/22   Rizwan, Saima, MD  omeprazole (PRILOSEC) 40 MG capsule Take 40 mg by mouth 2 (two) times daily. 09/28/19   [provider]  polyethylene glycol (MIRALAX ) 17 g packet Take 17 g by mouth daily. 11/09/23   Leath-Warren, Belen Bowers, NP  thiamine  (VITAMIN B-1) 100 MG tablet Take 1 tablet (100 mg total) by mouth daily. 07/01/22   Rizwan, Saima, MD  umeclidinium bromide  (INCRUSE ELLIPTA ) 62.5 MCG/ACT AEPB Inhale 1 puff into the lungs daily. 10/06/21   [provider]  venlafaxine  XR (EFFEXOR -XR) 150 MG 24 hr capsule Take 150 mg by mouth daily with breakfast.    [provider]  VENTOLIN  HFA 108 (90 Base) MCG/ACT inhaler INHALE 2 PUFFS INTO THE LUNGS EVERY 4 HOURS AS NEEDED. Patient taking differently: Inhale 2 puffs into the lungs every 4 (four) hours as needed for wheezing. 07/13/16   Leoma Raja, MD      Allergies    Fluoxetine hcl, Prozac [fluoxetine hcl], and Gabapentin    Review of Systems   Review of Systems  Respiratory:  Positive for cough and shortness of breath.   Cardiovascular:  Positive for chest pain.  All other systems reviewed and are negative.   Physical Exam Updated Vital Signs BP 125/75   Pulse 90   Temp 97.9 F (36.6 C) (Oral)   Resp (!) 22   Ht 5\' 1"  (1.549 m)   Wt 75.8 kg   SpO2 96%   BMI 31.55 kg/m  Physical Exam Vitals and nursing note reviewed.  Constitutional:      Appearance: Normal appearance.  HENT:     Head: Normocephalic and atraumatic.     Nose: Nose normal.     Mouth/Throat:     Mouth: Mucous membranes are moist.  Eyes:     Extraocular Movements: Extraocular movements intact.     Conjunctiva/sclera: Conjunctivae normal.     Pupils: Pupils are equal, round, and reactive to light.  Cardiovascular:     Rate and Rhythm: Normal rate and regular rhythm.     Pulses: Normal pulses.     Heart sounds: Normal heart sounds.  Pulmonary:     Effort: Pulmonary effort is normal. No tachypnea.     Breath sounds: No stridor. Wheezing present. No decreased breath sounds, rhonchi or rales.  Chest:     Chest wall: No tenderness.  Abdominal:     General: Abdomen is flat. Bowel sounds are normal.     Palpations: Abdomen is soft.     Tenderness: There is no  abdominal tenderness. There is no guarding.  Musculoskeletal:        General: Normal range of motion.     Cervical back: Normal range of motion and neck supple.     Right lower leg: No edema.     Left lower leg: No edema.  Skin:    General: Skin is warm and dry.     Findings: No ecchymosis or rash.  Neurological:     General: No focal deficit present.     Mental Status: She is alert and oriented to person, place, and time. Mental  status is at baseline.     Cranial Nerves: No cranial nerve deficit.     Motor: No weakness.  Psychiatric:        Mood and Affect: Mood normal.        Behavior: Behavior normal.        Thought Content: Thought content normal.        Judgment: Judgment normal.     ED Results / Procedures / Treatments   Labs (all labs ordered are listed, but only abnormal results are displayed) Labs Reviewed  COMPREHENSIVE METABOLIC PANEL WITH GFR - Abnormal; Notable for the following components:      Result Value   Sodium 130 (*)    CO2 20 (*)    Glucose, Bld 122 (*)    Creatinine, Ser 1.09 (*)    Alkaline Phosphatase 152 (*)    GFR, Estimated 54 (*)    All other components within normal limits  BLOOD GAS, VENOUS - Abnormal; Notable for the following components:   pH, Ven 7.48 (*)    pCO2, Ven 33 (*)    pO2, Ven 68 (*)    All other components within normal limits  RESP PANEL BY RT-PCR (RSV, FLU A&B, COVID)  [id number]  BRAIN NATRIURETIC PEPTIDE  CBC WITH DIFFERENTIAL/PLATELET  TROPONIN I (HIGH SENSITIVITY)    EKG EKG Interpretation Date/Time:  Sunday December 18 2023 14:34:15 EDT Ventricular Rate:  90 PR Interval:  183 QRS Duration:  99 QT Interval:  406 QTC Calculation: 497 R Axis:   4  Text Interpretation: Sinus rhythm Baseline wander Confirmed by Guadalupe Lee ([id number]) on 12/18/2023 3:04:00 PM  Radiology CT Angio Chest PE W/Cm &/Or Wo Cm Result Date: 12/18/2023 CLINICAL DATA:  Shortness of breath and midsternal chest pain. One month history of cough. EXAM:  CT ANGIOGRAPHY CHEST WITH CONTRAST TECHNIQUE: Multidetector CT imaging of the chest was performed using the standard protocol during bolus administration of intravenous contrast. Multiplanar CT image reconstructions and MIPs were obtained to evaluate the vascular anatomy. RADIATION DOSE REDUCTION: This exam was performed according to the departmental dose-optimization program which includes automated exposure control, adjustment of the mA and/or kV according to patient size and/or use of iterative reconstruction technique. CONTRAST:  75mL OMNIPAQUE IOHEXOL 350 MG/ML SOLN COMPARISON:  Chest x-ray, same date. FINDINGS: Cardiovascular: The heart is normal in size. No pericardial effusion. There is tortuosity and mild ectasia of the thoracic aorta along with scattered atherosclerotic calcifications but no aneurysm or dissection. Calcifications noted around the aortic valve. Moderate three-vessel coronary artery calcifications. The pulmonary arterial tree is well opacified. No filling defects to suggest pulmonary embolism. Mediastinum/Nodes: No mediastinal or hilar mass or lymphadenopathy. The esophagus is grossly normal. There is a large hiatal hernia noted. Lungs/Pleura: No infiltrates, edema or effusions. No pulmonary lesions. Small perifissural nodule in the lingula adjacent to the major fissure is likely a benign lymph node. No interstitial lung disease or bronchiectasis. The central tracheobronchial tree is unremarkable. Upper Abdomen: No significant upper abdominal findings. No hepatic or adrenal gland lesions. Scattered atherosclerotic disease. Musculoskeletal: No breast masses, supraclavicular or axillary adenopathy. The bony thorax is intact. Review of the MIP images confirms the above findings. IMPRESSION: 1. No CT findings for pulmonary embolism. 2. Tortuosity and mild ectasia of the thoracic aorta but no aneurysm or dissection. 3. Moderate three-vessel coronary artery calcifications. 4. Large hiatal hernia.  5. No acute pulmonary findings. 6. Aortic atherosclerosis. Aortic Atherosclerosis (ICD10-I70.0). Electronically Signed   By: Arna Lanes.D.  On: 12/18/2023 16:34   DG Chest Port 1 View Result Date: 12/18/2023 CLINICAL DATA:  Dyspnea EXAM: PORTABLE CHEST 1 VIEW COMPARISON:  06/18/2022 FINDINGS: The heart size and mediastinal contours are within normal limits. Moderate size hiatal hernia. Both lungs are clear. The visualized skeletal structures are unremarkable. IMPRESSION: 1. No active disease. 2. Moderate size hiatal hernia. Electronically Signed   By: Kimberley Penman M.D.   On: 12/18/2023 13:55    Procedures Procedures    Medications Ordered in ED Medications  chlorpheniramine-HYDROcodone  (TUSSIONEX) 10-8 MG/5ML suspension 5 mL (5 mLs Oral Given 12/18/23 1403)  ipratropium-albuterol  (DUONEB) 0.5-2.5 (3) MG/3ML nebulizer solution 3 mL (3 mLs Nebulization Given 12/18/23 1447)  methylPREDNISolone sodium succinate (SOLU-MEDROL) 125 mg/2 mL injection 125 mg (125 mg Intravenous Given 12/18/23 1403)  sodium chloride  0.9 % bolus 1,000 mL (1,000 mLs Intravenous Bolus 12/18/23 1446)  iohexol (OMNIPAQUE) 350 MG/ML injection 75 mL (75 mLs Intravenous Contrast Given 12/18/23 1606)    ED Course/ Medical Decision Making/ A&P                                 Medical Decision Making Amount and/or Complexity of Data Reviewed Labs: ordered. Radiology: ordered.  Risk Prescription drug management.   This patient presents to the ED for concern of cough, congestion, shortness of breath, chest pain  differential diagnosis includes COPD, asthma, CHF, ACS, pulmonary embolus, pneumonia, sepsis, pericarditis, myocarditis, endocarditis, aortic aneurysm or dissection    Additional history obtained:  Additional history obtained from none External records from outside source obtained and reviewed including none   Lab Tests:  I Ordered, and personally interpreted labs.  The pertinent results include: No  leukocytosis, no anemia, mild elevation of creatinine, mild hyponatremia, normal liver enzymes, negative troponin, negative BNP   Imaging Studies ordered:  I ordered imaging studies including chest x-ray, CTA of chest I independently visualized and interpreted imaging which showed no acute cardiopulmonary process, no pulmonary embolus I agree with the radiologist interpretation   Medicines ordered and prescription drug management:  I ordered medication including DuoNeb, Solu-Medrol, Tussionex for cough and shortness of breath Reevaluation of the patient after these medicines showed that the patient improved I have reviewed the patients home medicines and have made adjustments as needed   Problem List / ED Course:  Patient is doing well at this time and is stable for discharge back to her skilled nursing facility.  Patient has maintained her oxygen  saturations at 95% on room air.  Workup in the emergency department has been overall unremarkable.  Patient continues to state that she would like to be admitted to the hospital secondary to her cough but a long discussion was had with patient that she does not meet admission criteria at this point.  She has no indication for ACS, pulmonary embolus.  Oxygen  saturations are within acceptable limits without oxygen  supplementation.  She did have a mild acute kidney injury and was given fluids in the emergency department.  Patient has had no coughing during my exam.  Patient has no signs of acute respiratory distress at this point.  She has no indication for acute CHF.  Discussed with patient with the need for close follow-up with primary care doctor on an outpatient basis.  Will continue treatment on outpatient basis for possible COPD exacerbation.  Strict turn precautions were discussed for any new or worsening symptoms.  Patient has voiced understanding to the plan at this  time and had no additional questions.   Social Determinants of  Health:  None           Final Clinical Impression(s) / ED Diagnoses Final diagnoses:  None    Rx / DC Orders ED Discharge Orders     None         Emmalene Hare 12/18/23 1756    Early Glisson, MD 12/19/23 1255

## 2023-12-18 NOTE — ED Notes (Signed)
 Patient

## 2023-12-18 NOTE — ED Notes (Signed)
 Pt to go back by [facility]

## 2023-12-18 NOTE — ED Notes (Signed)
 Attempted to call [facility], [phone], but was unable to reach anyone at this time

## 2023-12-18 NOTE — ED Notes (Signed)
 Patient transported to X-ray

## 2023-12-18 NOTE — Discharge Instructions (Signed)
 Please follow-up closely with your primary care doctor on an outpatient basis.  Return to emergency department immediately for any new or worsening symptoms.

## 2023-12-21 ENCOUNTER — Ambulatory Visit: Admission: EM | Admit: 2023-12-21 | Discharge: 2023-12-21 | Discharge status: Against Medical Advice

## 2023-12-21 ENCOUNTER — Encounter: Payer: Self-pay | Admitting: Emergency Medicine

## 2023-12-21 ENCOUNTER — Other Ambulatory Visit: Payer: Self-pay

## 2023-12-21 NOTE — ED Triage Notes (Addendum)
 Pt reports history of copd and was seen for similar in ED a few days ago and reports no improvement of symptoms. Pt reports shortness of breath and cough has worsened. Denies any known fevers.   Pt unaware prescriptions from ED (Tessalon  Perles, doxycycline, and prednisone ) were sent in to [facility] pharmacy.

## 2023-12-21 NOTE — ED Notes (Signed)
 Family was unaware that pt had prescriptions prescribed at discharge in ED and elected to go pick these up prior to provider assessment and at end of triage. Provider aware.
